# Patient Record
Sex: Male | Born: 1935
Health system: Southern US, Community
[De-identification: ages and names within clinical notes are randomized; demographics above are authoritative.]

## PROBLEM LIST (undated history)

## (undated) DIAGNOSIS — K810 Acute cholecystitis: Secondary | ICD-10-CM

## (undated) DIAGNOSIS — M545 Low back pain, unspecified: Secondary | ICD-10-CM

## (undated) DIAGNOSIS — L719 Rosacea, unspecified: Secondary | ICD-10-CM

## (undated) DIAGNOSIS — I10 Essential (primary) hypertension: Secondary | ICD-10-CM

## (undated) DIAGNOSIS — K219 Gastro-esophageal reflux disease without esophagitis: Secondary | ICD-10-CM

## (undated) DIAGNOSIS — R7301 Impaired fasting glucose: Secondary | ICD-10-CM

## (undated) DIAGNOSIS — R609 Edema, unspecified: Secondary | ICD-10-CM

## (undated) DIAGNOSIS — G629 Polyneuropathy, unspecified: Secondary | ICD-10-CM

## (undated) DIAGNOSIS — M1990 Unspecified osteoarthritis, unspecified site: Secondary | ICD-10-CM

## (undated) DIAGNOSIS — Z7901 Long term (current) use of anticoagulants: Secondary | ICD-10-CM

## (undated) DIAGNOSIS — D649 Anemia, unspecified: Secondary | ICD-10-CM

## (undated) DIAGNOSIS — G709 Myoneural disorder, unspecified: Secondary | ICD-10-CM

## (undated) DIAGNOSIS — E538 Deficiency of other specified B group vitamins: Secondary | ICD-10-CM

## (undated) DIAGNOSIS — G4733 Obstructive sleep apnea (adult) (pediatric): Secondary | ICD-10-CM

## (undated) DIAGNOSIS — I5032 Chronic diastolic (congestive) heart failure: Secondary | ICD-10-CM

## (undated) DIAGNOSIS — N182 Chronic kidney disease, stage 2 (mild): Secondary | ICD-10-CM

## (undated) DIAGNOSIS — I4821 Permanent atrial fibrillation: Secondary | ICD-10-CM

## (undated) DIAGNOSIS — R7303 Prediabetes: Secondary | ICD-10-CM

## (undated) HISTORY — DX: Anemia, unspecified: D64.9

## (undated) HISTORY — DX: Obstructive sleep apnea (adult) (pediatric): G47.33

## (undated) HISTORY — DX: Myoneural disorder, unspecified: G70.9

## (undated) HISTORY — DX: Deficiency of other specified B group vitamins: E53.8

## (undated) HISTORY — DX: Long term (current) use of anticoagulants: Z79.01

## (undated) HISTORY — PX: HERNIA REPAIR: SHX51

## (undated) HISTORY — DX: Chronic kidney disease, stage 2 (mild): N18.2

## (undated) HISTORY — PX: APPENDECTOMY: SHX54

## (undated) HISTORY — DX: Permanent atrial fibrillation: I48.21

## (undated) HISTORY — PX: EYE SURGERY: SHX253

## (undated) HISTORY — DX: Prediabetes: R73.03

## (undated) HISTORY — DX: Essential (primary) hypertension: I10

## (undated) HISTORY — PX: CATARACT EXTRACTION: SUR2

## (undated) HISTORY — DX: Unspecified osteoarthritis, unspecified site: M19.90

## (undated) HISTORY — DX: Chronic diastolic (congestive) heart failure: I50.32

## (undated) HISTORY — PX: COLON SURGERY: SHX602

## (undated) HISTORY — PX: CARDIOVERSION: SHX1299

## (undated) HISTORY — DX: Rosacea, unspecified: L71.9

---

## 1959-10-03 HISTORY — PX: TUMOR REMOVAL: SHX12

## 1996-03-02 HISTORY — PX: HEMICOLECTOMY: SHX854

## 2000-05-29 ENCOUNTER — Encounter (INDEPENDENT_AMBULATORY_CARE_PROVIDER_SITE_OTHER): Payer: Self-pay | Admitting: Specialist

## 2000-05-29 ENCOUNTER — Ambulatory Visit (HOSPITAL_COMMUNITY): Admission: RE | Admit: 2000-05-29 | Discharge: 2000-05-29 | Payer: Self-pay | Admitting: Gastroenterology

## 2004-08-29 ENCOUNTER — Encounter (INDEPENDENT_AMBULATORY_CARE_PROVIDER_SITE_OTHER): Payer: Self-pay | Admitting: *Deleted

## 2004-08-29 ENCOUNTER — Ambulatory Visit (HOSPITAL_COMMUNITY): Admission: RE | Admit: 2004-08-29 | Discharge: 2004-08-29 | Payer: Self-pay | Admitting: Gastroenterology

## 2006-02-07 ENCOUNTER — Inpatient Hospital Stay (HOSPITAL_COMMUNITY): Admission: EM | Admit: 2006-02-07 | Discharge: 2006-02-09 | Payer: Self-pay | Admitting: Emergency Medicine

## 2006-02-07 ENCOUNTER — Encounter (INDEPENDENT_AMBULATORY_CARE_PROVIDER_SITE_OTHER): Payer: Self-pay | Admitting: Cardiology

## 2006-03-23 ENCOUNTER — Ambulatory Visit (HOSPITAL_COMMUNITY): Admission: RE | Admit: 2006-03-23 | Discharge: 2006-03-23 | Payer: Self-pay | Admitting: Cardiology

## 2006-05-28 ENCOUNTER — Encounter: Admission: RE | Admit: 2006-05-28 | Discharge: 2006-05-28 | Payer: Self-pay | Admitting: Family Medicine

## 2008-02-07 ENCOUNTER — Ambulatory Visit (HOSPITAL_COMMUNITY): Admission: RE | Admit: 2008-02-07 | Discharge: 2008-02-07 | Payer: Self-pay | Admitting: Cardiology

## 2008-03-09 ENCOUNTER — Inpatient Hospital Stay (HOSPITAL_COMMUNITY): Admission: AD | Admit: 2008-03-09 | Discharge: 2008-03-12 | Payer: Self-pay | Admitting: Cardiology

## 2009-06-16 ENCOUNTER — Emergency Department (HOSPITAL_COMMUNITY): Admission: EM | Admit: 2009-06-16 | Discharge: 2009-06-16 | Payer: Self-pay | Admitting: Emergency Medicine

## 2009-08-08 ENCOUNTER — Emergency Department (HOSPITAL_COMMUNITY): Admission: EM | Admit: 2009-08-08 | Discharge: 2009-08-08 | Payer: Self-pay | Admitting: Emergency Medicine

## 2010-03-10 ENCOUNTER — Ambulatory Visit (HOSPITAL_COMMUNITY): Admission: RE | Admit: 2010-03-10 | Discharge: 2010-03-10 | Payer: Self-pay | Admitting: Cardiology

## 2010-12-13 ENCOUNTER — Ambulatory Visit (HOSPITAL_COMMUNITY): Payer: Medicare Other

## 2010-12-13 ENCOUNTER — Ambulatory Visit (HOSPITAL_COMMUNITY)
Admission: RE | Admit: 2010-12-13 | Discharge: 2010-12-13 | Disposition: A | Discharge status: Home / Self Care | Payer: Medicare Other | Source: Ambulatory Visit | Attending: Cardiology | Admitting: Cardiology

## 2010-12-13 DIAGNOSIS — I4891 Unspecified atrial fibrillation: Secondary | ICD-10-CM | POA: Insufficient documentation

## 2010-12-13 DIAGNOSIS — Z01818 Encounter for other preprocedural examination: Secondary | ICD-10-CM | POA: Insufficient documentation

## 2010-12-13 DIAGNOSIS — Z01810 Encounter for preprocedural cardiovascular examination: Secondary | ICD-10-CM | POA: Insufficient documentation

## 2010-12-13 LAB — APTT: aPTT: 44 seconds — ABNORMAL HIGH (ref 24–37)

## 2010-12-15 NOTE — Op Note (Signed)
  NAME:  Cameron [name], Cameron [name] NO.:  [id number]  MEDICAL RECORD NO.:  [id number]           PATIENT TYPE:  O  LOCATION:  SDSC                         FACILITY:  [facility]  PHYSICIAN:  Armanda Magic, M.D.     DATE OF BIRTH:  1934-03-25  DATE OF PROCEDURE:  12/13/2010 DATE OF DISCHARGE:  12/13/2010                              OPERATIVE REPORT   REFERRING PHYSICIAN:  Bryan Lemma. Manus Gunning, MD  PROCEDURE:  Direct current cardioversion.  OPERATOR:  Armanda Magic, MD  INDICATIONS:  Atrial flutter.  IV MEDICATIONS:  Propofol 90 mg IV.  COMPLICATIONS:  None.  This is 77 year old male with a history of atrial fibrillation on chronic systemic anticoagulation with therapeutic INR above 2 for more than 4 weeks who recently went back in atrial fibrillation and sotalol was increased to 160 mg b.i.d.  He now presents for cardioversion.  The patient is brought to the day hospital in a fasting nonsedated state.  Informed consent was obtained.  The patient was connected to continuous heart rate and pulse oximetry monitoring and intermittent blood pressure monitoring.  Defibrillator pads were placed on the left anterior chest and posterior back.  After adequate anesthesia was obtained, a 150 joules synchronized biphasic shock was delivered which successfully converted the patient to sinus bradycardia.  The patient tolerated procedure well without complications and later was discharged to home.  ASSESSMENT: 1. Atrial fibrillation with controlled ventricular response, status     post increase sotalol to 160 mg b.i.d. 2. Successful cardioversion to sinus bradycardia. 3. Systemic anticoagulation with therapeutic INR.  PLAN:  Discharge to home after fully awake and ambulatory.  Will follow up with me in 2 weeks.     Armanda Magic, M.D.     TT/MEDQ  D:  12/13/2010  T:  12/14/2010  Job:  [id number]  cc:   Bryan Lemma. Manus Gunning, M.D.  Electronically Signed by Armanda Magic M.D. on  12/15/2010 03:37:42 PM

## 2010-12-19 LAB — PROTIME-INR
Prothrombin Time: 22 seconds — ABNORMAL HIGH (ref 11.6–15.2)
Prothrombin Time: 23.1 seconds — ABNORMAL HIGH (ref 11.6–15.2)

## 2011-01-04 LAB — PROTIME-INR
INR: 1.34 (ref 0.00–1.49)
Prothrombin Time: 16.5 seconds — ABNORMAL HIGH (ref 11.6–15.2)

## 2011-02-14 NOTE — Op Note (Signed)
NAME:  Cameron [name], Cameron [name] NO.:  [id number]   MEDICAL RECORD NO.:  [id number]           PATIENT TYPE:   LOCATION:                                 FACILITY:   PHYSICIAN:  Armanda Magic, M.D.     DATE OF BIRTH:  16-Mar-1934   DATE OF PROCEDURE:  03/12/2008  DATE OF DISCHARGE:                               OPERATIVE REPORT   PROCEDURE:  Direct current cardioversion.   OPERATOR:  Armanda Magic, M.D.   INDICATIONS:  Atrial fibrillation.   COMPLICATIONS:  None.   IV MEDICATIONS:  Pentothal 175 mg IV.   This is a 77 year old male who is status post recent cardioversion and  then reverted back to AFib.  He is now status post sotalol load and  presents for cardioversion.  After the patient was in the fasting  nonsedated state, informed consent was obtained.  Anesthesia was  administered using 175 mg of Pentothal IV.  Defibrillation pads were  placed on the left anterior and posterior chest and after adequate  anesthesia was obtained, a 120 joules synchronized biphasic shock was  delivered, which successfully converted the patient to sinus rhythm.  The patient tolerated the procedure well without any complaints or  complications.   ASSESSMENT:  1. Atrial fibrillation status post cardioversion and sinus rhythm      after sotalol load.  2. Systemic anticoagulation with therapeutic INR.  3. Hypertension, stable.   PLAN:  Discharged to home after fully awake and ambulating.  We will  discharge him on Coumadin on current home dose sotalol 80 mg b.i.d.  Lotrel of 10/10 mg daily.  He will stop his Toprol and his Cardizem.  He  will remain on fish oil tablets, multivitamins, folic acid, Mobic, and  B12 injections monthly and he will follow up with my nurse practitioner  Catalina Gravel in 2 weeks.      Armanda Magic, M.D.  Electronically Signed     TT/MEDQ  D:  03/12/2008  T:  03/13/2008  Job:  [id number]

## 2011-02-14 NOTE — Op Note (Signed)
NAME:  Cameron [name], BRUUN NO.:  [id number]   MEDICAL RECORD NO.:  [id number]          PATIENT TYPE:  OIB   LOCATION:  2899                         FACILITY:  [facility]   PHYSICIAN:  Armanda Magic, M.D.     DATE OF BIRTH:  03-11-1934   DATE OF PROCEDURE:  DATE OF DISCHARGE:  02/07/2008                               OPERATIVE REPORT   REFERRING PHYSICIAN:  Bryan Lemma. Manus Gunning, MD   PROCEDURE:  Direct current cardioversion.   SURGEON:  Armanda Magic, MD   INDICATIONS:  Atrial fibrillation.   COMPLICATIONS:  None.   INTRAVENOUS MEDICATIONS:  Pentothal 225 mg IV.   HISTORY:  This is a very pleasant 77 year old male with a history of  atrial fibrillation in the past and hypertension, currently on systemic  anticoagulation with a therapeutic INR, he presents for cardioversion.   The [facility] was [facility] in a fasting nonsedated  state.  Informed consent was obtained.  The patient was connected to  continuous heart rate and pulse oximetry monitoring and intermittent  blood pressure monitoring.  After adequate anesthesia was obtained, a  115-J synchronized biphasic shock was delivered, which successfully  converted the patient to normal sinus rhythm.  The patient tolerated the  procedure well and was later discharged to home.   ASSESSMENT:  1. Atrial fibrillation with controlled ventricular response.  2. Systemic anticoagulation with therapeutic international normalized      ratio.  3. Normal left ventricular function.  4. Successful cardioversion and sinus rhythm.   PLAN:  Discharged home after fully awake.  Follow up with nurse  practitioner in 2 weeks in my office.      Armanda Magic, M.D.  Electronically Signed     TT/MEDQ  D:  02/07/2008  T:  02/08/2008  Job:  [id number]   cc:   Bryan Lemma. Manus Gunning, M.D.

## 2011-02-14 NOTE — Discharge Summary (Signed)
NAME:  Cameron [name], GIENGER NO.:  [id number]   MEDICAL RECORD NO.:  [id number]          PATIENT TYPE:  INP   LOCATION:  2019                         FACILITY:  [facility]   PHYSICIAN:  Armanda Magic, M.D.     DATE OF BIRTH:  Mar 20, 1934   DATE OF ADMISSION:  03/09/2008  DATE OF DISCHARGE:  03/12/2008                               DISCHARGE SUMMARY   DISCHARGE DIAGNOSES:  1. Atrial fibrillation status post cardioversion.  2. Bradycardia, resolved after holding Cardizem.  3. Hypertension, stable.  4. Systemic anticoagulation on Coumadin.  5. Long-term medication use.   HISTORY OF PRESENT ILLNESS:  Mr. Cameron [name] is a 77 year old male patient of  Dr. Mayford Knife who has a history of paroxysmal atrial fibrillation and had  multiple cardioversions in the past.  He visited to the office on Feb 21, 2008, and was found to again in atrial fibrillation with a  controlled ventricular response.  He is asymptomatic with this.  At that  point, a decision was made to admit the patient for a sotalol load.   The patient was admitted on March 09, 2008, for sotalol loading.  The  patient was loaded on sotalol and his QTc was around 449 milliseconds.  He did have some bradycardia and this resolved after holding his  Cardizem.  He remained on Coumadin for systemic anticoagulation.  Because, he remained in atrial fibrillation, he did not convert  chemically we did perform a direct current cardioversion on March 12, 2008.  This was successful in restoring normal sinus rhythm.   DISCHARGE LABS:  Hemoglobin 14.7, hematocrit 43.6, white count 7.0, and  platelets 139,000.  Sodium 137, potassium 4.3, BUN 12, creatinine 0.95.  PT 24.7 and INR 2.2.  TSH 1.086.   He is to remain on a low-sodium, heart-healthy diet.  Increase  activities slowly.  Return to see Dr. Rocky Crafts, nurse  practitioner on March 19, 2008, at 1:00 p.m.  An EKG will be done at that  visit.   He is to stop taking his Toprol,  Cardizem, and lisinopril.  Instead he  is to take these medications;  1. Coumadin.  Continue home dose.  2. Sotalol 80 mg twice a day.  3. Lotrel 10/10, 1 tablet daily.  4. Folic acid 1 mg daily.  5. Multivitamin 1 tablet daily.  6. Fish oil 1 g daily.  7. B12 injections monthly.   The patient is discharged home in stable but improved condition.      Guy Franco, P.A.      Armanda Magic, M.D.  Electronically Signed    LB/MEDQ  D:  03/12/2008  T:  03/13/2008  Job:  [id number]

## 2011-02-17 NOTE — H&P (Signed)
NAME:  Cameron [name], Cameron [name] NO.:  [id number]   MEDICAL RECORD NO.:  [id number]          PATIENT TYPE:  INP   LOCATION:  1831                         FACILITY:  [facility]   PHYSICIAN:  Armanda Magic, M.D.     DATE OF BIRTH:  Jul 17, 1934   DATE OF ADMISSION:  02/07/2006  DATE OF DISCHARGE:                                HISTORY & PHYSICAL   PRIMARY CARE PHYSICIAN:  Bryan Lemma. Manus Gunning, M.D.   CARDIOLOGIST:  Armanda Magic, M.D.   CHIEF COMPLAINTS:  New onset atrial fibrillation.   HISTORY OF PRESENT ILLNESS:  Cameron [name] is a 77 year old Caucasian male with  no previous cardiac history who was discovered to have an irregular heart  rhythm on routine physical exam by his primary care physician.  An EKG was  obtained by his primary care physician and revealed a new onset atrial  fibrillation with RVR at 112 beats per minute.  He was referred to Dr.  Mayford Knife at [facility] for further evaluation.  On yesterday [name]  presented to the [facility] office and during his office visit  received treatment for his elevated heart rate with IV Cardizem and IV  Lopressor and experienced a heart rate that decreased into the 90s.  He was  sent home with a new prescription for oral Coumadin and oral Toprol XL 25 mg  daily.  He was asked to get the Toprol XL 25 mg daily filled immediately and  take one dosage prior to going home.  [name] returned to the [facility]  Cardiology office this morning for a repeat EKG which revealed atrial  fibrillation with RVR at 148 beats per minute.  He was sent immediately to  the [facility]  Emergency department by private vehicle which was driven by  his son for rate control. An EKG was obtained upon arrival and revealed  atrial fibrillation with RVR at 147 beats per minute.  The patient admits to  taking one dose of the Toprol XL 25 mg last night and then again this  morning.  While in the emergency department, his blood pressure was 147/105  and his  heart rate was 146.  An IV Cardizem 10 mg bolus was started with 5  mg per hour drip.  In addition a stat dose of Toprol XL 25 mg was given.  The patient denied awareness of any symptoms associated with his atrial  fibrillation.  In addition, he denied chest pain, palpitations, dizziness,  syncope, PND, orthopnea; however, he admits to occasional lower extremity  edema as well as fatigue.  He denies an increase in intensity of his fatigue  over the last few months or weeks.   PAST MEDICAL HISTORY:  1.  Hypertension.  2.  In 1997 he had a benign colon tumor removed, pathology of which was two      tubulovillous adenomas with focal severe dysplasia in the right colon.      His appendix was removed as well.  Mesenteric lymph nodes were benign.  3.  History of gout.   ALLERGIES:  No known drug allergies.  CURRENT LIST OF MEDICATIONS:  1.  Coumadin 5 mg daily.  2.  Toprol XL 25 mg daily.  3.  Lisinopril 10 mg daily.  4.  Folic acid 1 mg daily.  5.  Mobic p.r.n.  6.  Loratadine 10 mg daily.  7.  Enteric-coated aspirin 325 mg daily.  8.  Multivitamin daily.  9.  B-12 shots.   PAST SURGICAL HISTORY:  1.  Status post resection of colon polyps in 1997.  2.  Status post appendectomy in 1997.   FAMILY HISTORY:  1.  Father deceased age 69, lung cancer.  2.  Mother deceased age 36, kidney cancer, hypertension.  3.  Sister deceased at age 11, lung cancer.  4.  Brother deceased 69, alcoholism.  5.  Sister age 39 in good health with hypertension.  6.  Twin brothers ages 88, in good health.   SOCIAL HISTORY:  He is married with two adult children who are both in good  health.  He lives with his wife who is wheelchair bound secondary to  multiple sclerosis.  He is the primary caretaker for his wife.  He used to  smoke a pack of cigarettes per day for 20 years however smoking cessation  occurred in 1976.  He admits to drinking 1-3 cans of beer occasionally.  He  denies significant caffeine  intake and denies illicit drug use.   REVIEW OF SYSTEMS:  All other systems reviewed are negative other than what  is stated in the HPI.   PHYSICAL EXAM:  GENERAL: A 77 year old well-developed, well-nourished male,  pleasant and cooperative, NAD.  VITAL SIGNS:  Temperature 98, pulse 150 and irregular, respirations 20,  blood pressure 155/100, O2 saturation 98% on room air.  HEENT: Unremarkable.  NECK: Supple without JVD or carotid bruits bilaterally.  Carotid upstrokes  are 2+ bilaterally.  LUNGS: Breath sounds are clear to auscultation bilaterally without wheezes,  rhonchi, crackles or rales.  HEART:  Irregular rate and rhythm.  S1-S2 normal without murmurs, gallops,  clicks or rubs.  ABDOMEN: Soft, nontender, nondistended with active bowel sounds.  No aortic  or iliac bruits were auscultated.  No masses or hepatomegaly.  EXTREMITIES: No peripheral edema.  DP and PT pulses 2+/2, bilaterally.  SKIN: Warm and dry without rashes or lesions.  NEURO: Alert and oriented x3.  No focal deficits.  PSYCH:  Normal mood and affect.   LABORATORY DATA:  Sodium 137, potassium 4.1, chloride 106, CO2 26.3, BUN 17,  creatinine 1.1, glucose 110.  White blood count 6.9, hemoglobin 16.9,  hematocrit 49.2, platelets 149.  Point of care markers x1, myoglobin 96.2,  CK-MB 1.6, troponin I less than 0.05. PT 14.7, INR 1.1, PTT 31, TSH and a  STAT D-dimer is pending.   Chest x-ray; mild cardiac enlargement.  No active disease.   EKG; atrial fibrillation with RVR at a ventricular rate of 147 beats per  minute.   ASSESSMENT:  1.  Rapid atrial fibrillation/atrial flutter.  2.  Systemic anticoagulation - started on Coumadin on 02/06/2006.  3.  Hypertension with elevated diastolic blood pressure.   PLAN:  1.  Admit to cardiac telemetry unit.  2.  Rule out MI with serial cardiac enzymes.  3.  Subcu Lovenox every 12 hours per pharmacy protocol. 4.  Coumadin per pharmacy dosing.  5.  A daily EKG and INR.   6.  IV Cardizem drops.  7.  Increase Toprol XL to 50 mg daily.  8.  NPO after midnight tonight on  02/07/2006.  9.  Adenosine Cardiolite on 05/10 if heart rate is controlled.  10. The patient was seen, interviewed, and examined by Dr. Armanda Magic.  He      is in agreement with the H&P as outlined.  Mr. Fieldhouse came to the [facility] emergency department today after being seen in the office for      repeat EKG that showed atrial fibrillation with RVR.  His physical exam      is benign with a rapid irregular rhythm.  EKG shows rapid atrial flutter      with 144 beats per minute and intermittent atrial fibrillation.  The      patient denies complaint of chest pain, palpitations, or shortness of      breath.      Tylene Fantasia, [place]      Armanda Magic, M.D.  Electronically Signed    RDM/MEDQ  D:  02/07/2006  T:  02/07/2006  Job:  [id number]   cc:   Bryan Lemma. Manus Gunning, M.D.  Fax: [phone]

## 2011-02-17 NOTE — Procedures (Signed)
[facility]  Patient:    Cameron [name], Cameron [name]                        MRN: [id number] Proc. Date: 05/29/00 Adm. Date:  [id number] Attending:  Nelda Marseille CC:         Blair Heys, M.D.   Procedure Report  PROCEDURE:  Colonoscopy with polypectomy.  INDICATIONS:  Patient with large adenomatous polyp requiring surgical options, due for repeat screening.  INFORMED CONSENT:  Consent was signed after risk, benefits, methods and options were thoroughly discussed multiple times in the past.  MEDICINES USED:  Demerol 50 mg, Versed 5 mg.  DESCRIPTION OF PROCEDURE:  Rectal inspection was pertinent for external hemorrhoids.  Digital exam was negative.  The video colonoscope was inserted and easily advanced to the anastomosis which was identified by the side-to-side anastomosis.  We were able to advance a short ways into both small bowel limbs.  Just above the anastomosis, a tiny 2-3 mm polyp was seen, was hot biopsied x 2.  The scope was then further withdrawn.  The prep was adequate.  There was minimal liquid stool that required washing and suctioning.  On slow withdrawal through his colon, no abnormalities were seen as we withdrew back to the rectum.  Once back in the rectum, the scope was retroflexed, pertinent for some internal hemorrhoids, small.  The scope was straightened.  Anorectal pull-through confirmed the above.  The scope was reinserted a short ways up the left side of the colon.  Air was suctioned and scope removed.  The patient tolerated the procedure well, and there was no obvious immediate complication.  ENDOSCOPIC DIAGNOSES: 1. Internal and external hemorrhoids. 2. Side-to-side colon into small bowel anastomosis along the ascending colon. 3. Polyp 2-3 mm in the proximal level of the anastomosis, status post hot    biopsied x 2. 4. Otherwise within normal limits to the terminal ileum.  PLAN: 1. Await pathology but probably recheck colon  screening in 3-5 years. 2. Return care to Dr. Manus Gunning for customary yearly rectals and guaiacs. 3. I will be happy to see back p.r.n.  In the meantime, one week of no aspirin    or arthritis pills. DD:  05/29/00 TD:  05/29/00 Job: [id number]

## 2011-02-17 NOTE — Op Note (Signed)
NAME:  ZYIR, GASSERT NO.:  [id number]   MEDICAL RECORD NO.:  [id number]          PATIENT TYPE:  OIB   LOCATION:  2899                         FACILITY:  [facility]   PHYSICIAN:  Armanda Magic, M.D.     DATE OF BIRTH:  1934/09/19   DATE OF PROCEDURE:  03/23/2006  DATE OF DISCHARGE:                                 OPERATIVE REPORT   REFERRING PHYSICIAN:  Bryan Lemma. Manus Gunning, M.D.   PROCEDURE:  Direct current cardioversion.   SURGEON:  Armanda Magic, M.D.   INDICATIONS:  Atrial fibrillation.   COMPLICATIONS:  None.   IV MEDICATIONS:  Diprivan 180 mg IV.   This is a very pleasant 77 year old white male who recently presented with  new onset atrial fibrillation with rapid ventricular response. A 2-D  echocardiogram showed mild LV dysfunction EF 45-50%.  Cardiolite scan showed  no evidence of inducible ischemia.  He now presents for direct current  cardioversion after adequate anticoagulation on Coumadin with an INR of  greater than or equal to 2 for greater than 4 weeks.   The [facility] the [facility] in the fasting nonsedated state.  Informed consent was obtained.  The patient was connected to continuous  heart rate and pulse oximetry monitoring and intermittent blood pressure  monitoring.  Defibrillator  pads were placed on the anterior and posterior  left chest wall after adequate anesthesia was given which included Diprivan  180 mg IV and after adequate anesthesia was obtained.  A synchronized 100  joule biphasic shock was delivered which successfully converted the patient  to normal sinus rhythm with frequent PACs.  The patient tolerated the  procedure well without complications.   ASSESSMENT:  1.  Atrial fibrillation.  2.  Cystic anticoagulation with therapeutic INR greater than 2 for four      weeks.  3.  Successful cardioversion to normal sinus rhythm with PACs.   PLAN:  Continue current medications, discharge to home after fully awake and  follow up with me in two to three weeks.      Armanda Magic, M.D.  Electronically Signed     TT/MEDQ  D:  03/23/2006  T:  03/23/2006  Job:  [id number]   cc:   Bryan Lemma. Manus Gunning, M.D.  Fax: [phone]

## 2011-02-17 NOTE — Discharge Summary (Signed)
NAME:  Cameron [name], Cameron [name]               ACCOUNT NO.:  [id number]   MEDICAL RECORD NO.:  [id number]          PATIENT TYPE:  INP   LOCATION:  4705                         FACILITY:  [facility]   PHYSICIAN:  Armanda Magic, M.D.     DATE OF BIRTH:  1934-09-30   DATE OF ADMISSION:  02/07/2006  DATE OF DISCHARGE:  02/09/2006                                 DISCHARGE SUMMARY   DISCHARGE DIAGNOSES:  1.  New-onset atrial fibrillation/flutter, rate-controlled.  2.  Systemic anticoagulation with discharge INR of 1.9.  3.  Low normal left ventricular ejection fraction.  4.  Hypertension, controlled.  5.  History of gout.  6.  Longterm medication use.  7.  Longterm Coumadin use.  8.  History of benign colon tumor removal in 1997 with incidental appendix      removal.   HOSPITAL COURSE:  [name] is a 77 year old male patient who was found to  be in a new-onset atrial fibrillation by his primary care physician.  He is  referred to [facility] for further evaluation.  In the office Feb 06, 2006, the patient's heart rate had been increased and he received IV  Cardizem as well as IV Lopressor.  His heart rate then decreased into the  90s and he was sent home with a Coumadin load and Toprol XL.  He returned to  [facility] on the date of admission and he remained in atrial  fibrillation with RVR.  He was then admitted to [facility], where he was  started on IV Cardizem, beta blocker was increased, and he was started on  Lovenox as well as Coumadin load.   During his hospital stay the patient underwent an adenosine Cardiolite that  showed no underlying ischemia.  The study showed no ischemia, EF around 51%.  Please note, the patient was in atrial fibrillation during that time.   The patient had lab work done during this study which included TSH of 1.301.  Hemoglobin 16.2, hematocrit 46.7, platelets 169.  Cardiac isoenzymes, which  were negative.  Portable chest x-ray showed mild cardiomegaly,  no active  disease.   The patient was discharged home on Feb 09, 2006, with good rate control.  He  was discharged home on the following medications:  1.  Coumadin 5 mg one tablet every Monday, Wednesday, Friday and Sunday with      half a tablet on Tuesday, Thursday and Saturday.  2.  Cardizem CD 180 mg a day.  3.  Lovenox 90 mg subcu.  He is to take one dose on Feb 09, 2006, at 10      p.m., one dose on Feb 10, 2006, at 10 a.m., another dose on May 12,      20 07, at 10 p.m., and then stop the Lovenox.  4.  Toprol XL 50 mg a day.  5.  Lisinopril 10 mg a day.  6.  Mobic as needed.  7.  Folic acid and multivitamin daily.  8.  Loratadine 10 mg a day.  9.  Baby aspirin 81 mg a day.   He is to  remain on a low-fat diet with knowledge of Coumadin restrictions.  He is to call if he has any recurrent symptoms, follow up at Dr. Norris Cross  office for lab work and EKG on Monday, May 14, at 3:15 p.m., then follow up  with Dr. Mayford Knife on Mar 01, 2006, at 10:45 a.m.      Guy Franco, P.A.      Armanda Magic, M.D.  Electronically Signed    LB/MEDQ  D:  02/09/2006  T:  02/10/2006  Job:  [id number]   cc:   Bryan Lemma. Manus Gunning, M.D.  Fax: [phone]

## 2011-02-17 NOTE — Op Note (Signed)
NAME:  Cameron [name], Cameron [name] NO.:  [id number]   MEDICAL RECORD NO.:  [id number]          PATIENT TYPE:  AMB   LOCATION:  ENDO                         FACILITY:  [facility]   PHYSICIAN:  Petra Kuba, M.D.    DATE OF BIRTH:  [birth year]   DATE OF PROCEDURE:  08/29/2004  DATE OF DISCHARGE:                                 OPERATIVE REPORT   PROCEDURE:  Colonoscopy with biopsy.   ENDOSCOPIST:  Petra Kuba, M.D.   INDICATIONS FOR PROCEDURE:  A patient with a history of a large adenomatous  polyp requiring surgical options, now due for a repeat screening.  A consent  was signed after the risks, benefits, methods and options were thoroughly  discussed in the office and multiple times in the past.   MEDICINES USED:  Demerol 75 mg, Versed 7.5 mg.   DESCRIPTION OF PROCEDURE:  A rectal inspection is pertinent for external  hemorrhoids, small.  A digital examination is negative.  The pediatric video  adjustable colonoscope was inserted and easily advanced around the colon to  the anastomosis.  This did require some abdominal pressure, but no position  changes.  The anastomosis was a colon side to small bowel side, and both of  the anastomoses were seen.  Along the surgical line were two tiny possible  little polyps, probably similar to what was seen before, and were re-cold  biopsied and put in the same container.  No other abnormality was seen on  insertion.  The scope was slowly withdrawn.  The prep was adequate.  There  was minimal liquid stool there which required washing and suctioning.  With  some withdrawal back in the rectum, no abnormalities were seen.  Specifically, no polyps, tumors, masses, or diverticula..  Once back in the  rectum, an anal-rectal pull-through on retroflexion confirmed some small  hemorrhoids.  The scope was drained and readvanced a short way up the left  side of the colon.  The air was suctioned.  The scope was removed.  The patient tolerated the  procedure well.  There were no obvious immediate  complications.   ENDOSCOPIC ASSESSMENT:  1.  Internal and external small hemorrhoids.  2.  Anastomosis with two tiny doubtful polyps that is status post biopsied.  3.  Otherwise within normal limits to the terminal ileum and colon      anastomosis.   PLAN:  Will await pathology.  Will probably recheck a colon screening in  five years.  I will be happy to see back p.r.n.  Otherwise return care to  Dr. Bryan Lemma. Ehinger for the customary health care maintenance, including  yearly rectals and guaiacs.       MEM/MEDQ  D:  08/29/2004  T:  08/29/2004  Job:  [id number]   cc:   Bryan Lemma. Manus Gunning, M.D.  [address] [place]  [place] [address]  Fax: [phone]

## 2011-06-29 LAB — MAGNESIUM: Magnesium: 2.3

## 2011-06-29 LAB — CBC
MCV: 91.4
RBC: 4.77
RDW: 14.9
WBC: 7

## 2011-06-29 LAB — BASIC METABOLIC PANEL
Calcium: 9.2
Creatinine, Ser: 0.95
GFR calc non Af Amer: >60
Glucose, Bld: 86
Potassium: 4.3

## 2011-06-29 LAB — PROTIME-INR
INR: 2.2 — ABNORMAL HIGH
INR: 2.4 — ABNORMAL HIGH
Prothrombin Time: 24.7 — ABNORMAL HIGH
Prothrombin Time: 28 — ABNORMAL HIGH

## 2011-08-10 ENCOUNTER — Institutional Professional Consult (permissible substitution): Payer: Federal, State, Local not specified - PPO | Admitting: Internal Medicine

## 2011-08-22 ENCOUNTER — Encounter: Payer: Self-pay | Admitting: *Deleted

## 2011-08-23 ENCOUNTER — Ambulatory Visit (INDEPENDENT_AMBULATORY_CARE_PROVIDER_SITE_OTHER): Payer: Medicare Other | Admitting: Internal Medicine

## 2011-08-23 ENCOUNTER — Encounter: Payer: Self-pay | Admitting: Internal Medicine

## 2011-08-23 VITALS — BP 152/80 | HR 79 | Ht 70.0 in | Wt 200.0 lb

## 2011-08-23 DIAGNOSIS — I4891 Unspecified atrial fibrillation: Secondary | ICD-10-CM

## 2011-08-23 DIAGNOSIS — I4821 Permanent atrial fibrillation: Secondary | ICD-10-CM | POA: Insufficient documentation

## 2011-08-23 NOTE — Progress Notes (Signed)
Primary Care Physician: Deborah Chalk, MD Referring Physician:Dr Turner   Cameron [name] is a 77 y.o. male with a h/o persistent atrial fibrillation, atrial flutter, and HTN who presents today for EP consultation.  He reports that in 2007, he presented for routine physical and was found to have afib.  He required cardioversion at that time.  He was placed on cardizem, toprol, and coumadin.  He developed recurrent 2009 and was therefore placed on sotalol.  He reports asymptomatic bradycardia with this.  He developed recurrent afib and was therefore placed on sotalol 160mg  BID twice daily.  He has most recently been cardioverted 3/12.  He does not recall feeling any better after cardioversion.  He reports being asymptomatic with his atrial arrhythmias.  He finds that his heart rates are 50s in sinus and 80s in afib.  He reports good exercise tolerance.  He mows his grass with no trouble.  His family is concerned that he appears dypsneic at times, though he denies this. Today, he denies symptoms of palpitations, chest pain, shortness of breath, orthopnea, PND,  dizziness, presyncope, syncope, or neurologic sequela. He has chronic edema for which he controls this with support hose.  The patient is tolerating medications without difficulties and is otherwise without complaint today.   He does have a h/o scarlet fever.  Past Medical History  Diagnosis Date  . Hypertension   . Atrial fibrillation     persistent  . Bradycardia     asymptomatic  . Rosacea   . Gout   . Vitamin B12 deficiency   . Atrial flutter    Past Surgical History  Procedure Date  . Hemicolectomy 03/1996  . Cardioversion 2009  . Cardioversion 2011    Current Outpatient Prescriptions  Medication Sig Dispense Refill  . bimatoprost (LUMIGAN) 0.01 % SOLN Apply 1 drop to eye at bedtime.        . cyanocobalamin (,VITAMIN B-12,) 1000 MCG/ML injection Inject 1,000 mcg into the muscle every 30 (thirty) days.        Marland Kitchen doxazosin  (CARDURA XL) 4 MG 24 hr tablet Take 4 mg by mouth daily with breakfast.        . fish oil-omega-3 fatty acids 1000 MG capsule Take 1 g by mouth daily.        . folic acid (FOLVITE) 1 MG tablet Take 1 mg by mouth daily.        . furosemide (LASIX) 40 MG tablet Take 40 mg by mouth daily.        Marland Kitchen gabapentin (NEURONTIN) 300 MG capsule Take 300 mg by mouth. 2-3 times daily       . meloxicam (MOBIC) 15 MG tablet Take 7.5 mg by mouth as needed.        . multivitamin (THERAGRAN) per tablet Take 1 tablet by mouth daily.        . Rivaroxaban (XARELTO) 20 MG TABS Take 1 tablet by mouth daily.        . sotalol (BETAPACE) 160 MG tablet Take 320 mg by mouth 2 (two) times daily.        . traMADol (ULTRAM) 50 MG tablet Take 50 mg by mouth every 4 (four) hours as needed. Maximum dose= 8 tablets per day         Allergies  Allergen Reactions  . Ace Inhibitors   . Clonidine Derivatives     History   Social History  . Marital Status: Married    Spouse Name: N/A  Number of Children: N/A  . Years of Education: N/A   Occupational History  . Not on file.   Social History Main Topics  . Smoking status: Former Games developer  . Smokeless tobacco: Never Used   Comment: quit 1976  . Alcohol Use: Yes     1-2 beers per day  . Drug Use: No  . Sexually Active: Not on file   Other Topics Concern  . Not on file   Social History Narrative   Pt lives in [place] with spouse.Retired in 1999 from the [employer].  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    Family History  Problem Relation Age of Onset  . Stroke Mother 60  . Dementia Sister     ROS- All systems are reviewed and negative except as per the HPI above  Physical Exam: Filed Vitals:   08/23/11 1004  BP: 152/80  Pulse: 79  Height: 5\' 10"  (1.778 m)  Weight: 200 lb (90.719 kg)    GEN- The patient is well appearing, alert and oriented x 3 today.   Head- normocephalic, atraumatic Eyes-   Sclera clear, conjunctiva pink Ears- hearing intact Oropharynx- clear Neck- supple, no JVP Lymph- no cervical lymphadenopathy Lungs- Clear to ausculation bilaterally, normal work of breathing Heart- Regular rate and rhythm, no murmurs, rubs or gallops, PMI not laterally displaced GI- soft, NT, ND, + BS Extremities- no clubbing, cyanosis, or edema MS- no significant deformity or atrophy Skin- no rash or lesion Psych- euthymic mood, full affect Neuro- strength and sensation are intact  EKG today reveals atypical atrial flutter, V rate 81 bpm Echo 2/12- EF 55%, LVEDD 49, LA size 54mm, mild MR  Assessment and Plan:

## 2011-08-23 NOTE — Patient Instructions (Signed)
Your physician recommends that you schedule a follow-up appointment in: as needed  

## 2011-08-23 NOTE — Assessment & Plan Note (Signed)
The patient has a h/o multiple atrial arrhythmias including persistent atrial fibrillation, typical atrial flutter, and atypical atrial flutter.  He has a h/o scarlet fever and has a left atrial size of 54mm by echo 2/12.  He has failed rhythm control with sotalol. Presently he is completely asymptomatic with his atypical atrial flutter.  HE does not feel that he received any symptomatic relief with prior cardioversion.  He is adequately rate controlled and anticoagulated with xarelto.  Therapeutic strategies for atrial arrhythmias including medicine and ablation were discussed in detail with the patient today. Risk, benefits, and alternatives to EP study and radiofrequency ablation were also discussed in detail today. As he is asymptomatic, I would recommend rate control long term.  Given his severe atrial enlargement and multiple atrial arrhythmias, I think that our ability to maintain sinus rhythm would be on the order of 50% and would likely require multiple procedures.   The patient and I discussed results of the AFFIRM trial which showed rate control to be equivalent to rhythm control for patients with asymptomatic atrial arrhythmias.  At this time, he would like to continue rate control as his long term strategy. I would consider stopping sotalol and switching to toprol to avoid the potential proarrhythmic effects of sotalol, but will defer this decision to Dr Mayford Knife.  He will follow-up with Dr Mayford Knife and I will see him as needed.

## 2011-10-06 DIAGNOSIS — E538 Deficiency of other specified B group vitamins: Secondary | ICD-10-CM | POA: Diagnosis not present

## 2011-10-12 DIAGNOSIS — H33309 Unspecified retinal break, unspecified eye: Secondary | ICD-10-CM | POA: Diagnosis not present

## 2011-10-16 DIAGNOSIS — H4010X0 Unspecified open-angle glaucoma, stage unspecified: Secondary | ICD-10-CM | POA: Diagnosis not present

## 2011-10-16 DIAGNOSIS — H02409 Unspecified ptosis of unspecified eyelid: Secondary | ICD-10-CM | POA: Diagnosis not present

## 2011-10-16 DIAGNOSIS — H02839 Dermatochalasis of unspecified eye, unspecified eyelid: Secondary | ICD-10-CM | POA: Diagnosis not present

## 2011-10-30 DIAGNOSIS — I119 Hypertensive heart disease without heart failure: Secondary | ICD-10-CM | POA: Diagnosis not present

## 2011-10-30 DIAGNOSIS — I4891 Unspecified atrial fibrillation: Secondary | ICD-10-CM | POA: Diagnosis not present

## 2011-10-30 DIAGNOSIS — R079 Chest pain, unspecified: Secondary | ICD-10-CM | POA: Diagnosis not present

## 2011-11-06 DIAGNOSIS — G609 Hereditary and idiopathic neuropathy, unspecified: Secondary | ICD-10-CM | POA: Diagnosis not present

## 2011-12-04 DIAGNOSIS — E538 Deficiency of other specified B group vitamins: Secondary | ICD-10-CM | POA: Diagnosis not present

## 2011-12-04 DIAGNOSIS — D510 Vitamin B12 deficiency anemia due to intrinsic factor deficiency: Secondary | ICD-10-CM | POA: Diagnosis not present

## 2012-01-01 DIAGNOSIS — D510 Vitamin B12 deficiency anemia due to intrinsic factor deficiency: Secondary | ICD-10-CM | POA: Diagnosis not present

## 2012-01-24 DIAGNOSIS — Z79899 Other long term (current) drug therapy: Secondary | ICD-10-CM | POA: Diagnosis not present

## 2012-01-24 DIAGNOSIS — I4891 Unspecified atrial fibrillation: Secondary | ICD-10-CM | POA: Diagnosis not present

## 2012-01-24 DIAGNOSIS — R609 Edema, unspecified: Secondary | ICD-10-CM | POA: Diagnosis not present

## 2012-01-24 DIAGNOSIS — I119 Hypertensive heart disease without heart failure: Secondary | ICD-10-CM | POA: Diagnosis not present

## 2012-02-01 DIAGNOSIS — I119 Hypertensive heart disease without heart failure: Secondary | ICD-10-CM | POA: Diagnosis not present

## 2012-02-01 DIAGNOSIS — D510 Vitamin B12 deficiency anemia due to intrinsic factor deficiency: Secondary | ICD-10-CM | POA: Diagnosis not present

## 2012-02-01 DIAGNOSIS — I4891 Unspecified atrial fibrillation: Secondary | ICD-10-CM | POA: Diagnosis not present

## 2012-02-23 DIAGNOSIS — H409 Unspecified glaucoma: Secondary | ICD-10-CM | POA: Diagnosis not present

## 2012-02-23 DIAGNOSIS — H4011X0 Primary open-angle glaucoma, stage unspecified: Secondary | ICD-10-CM | POA: Diagnosis not present

## 2012-04-03 DIAGNOSIS — E538 Deficiency of other specified B group vitamins: Secondary | ICD-10-CM | POA: Diagnosis not present

## 2012-04-08 DIAGNOSIS — H43819 Vitreous degeneration, unspecified eye: Secondary | ICD-10-CM | POA: Diagnosis not present

## 2012-04-08 DIAGNOSIS — Z961 Presence of intraocular lens: Secondary | ICD-10-CM | POA: Diagnosis not present

## 2012-04-08 DIAGNOSIS — H4011X0 Primary open-angle glaucoma, stage unspecified: Secondary | ICD-10-CM | POA: Diagnosis not present

## 2012-04-08 DIAGNOSIS — H4310 Vitreous hemorrhage, unspecified eye: Secondary | ICD-10-CM | POA: Diagnosis not present

## 2012-04-08 DIAGNOSIS — H409 Unspecified glaucoma: Secondary | ICD-10-CM | POA: Diagnosis not present

## 2012-04-08 DIAGNOSIS — H33319 Horseshoe tear of retina without detachment, unspecified eye: Secondary | ICD-10-CM | POA: Diagnosis not present

## 2012-04-08 DIAGNOSIS — H33309 Unspecified retinal break, unspecified eye: Secondary | ICD-10-CM | POA: Diagnosis not present

## 2012-04-11 DIAGNOSIS — H33319 Horseshoe tear of retina without detachment, unspecified eye: Secondary | ICD-10-CM | POA: Diagnosis not present

## 2012-04-18 DIAGNOSIS — H33319 Horseshoe tear of retina without detachment, unspecified eye: Secondary | ICD-10-CM | POA: Diagnosis not present

## 2012-05-02 DIAGNOSIS — H4310 Vitreous hemorrhage, unspecified eye: Secondary | ICD-10-CM | POA: Diagnosis not present

## 2012-05-02 DIAGNOSIS — E538 Deficiency of other specified B group vitamins: Secondary | ICD-10-CM | POA: Diagnosis not present

## 2012-05-02 DIAGNOSIS — H43819 Vitreous degeneration, unspecified eye: Secondary | ICD-10-CM | POA: Diagnosis not present

## 2012-05-02 DIAGNOSIS — Z961 Presence of intraocular lens: Secondary | ICD-10-CM | POA: Diagnosis not present

## 2012-05-02 DIAGNOSIS — H33319 Horseshoe tear of retina without detachment, unspecified eye: Secondary | ICD-10-CM | POA: Diagnosis not present

## 2012-05-30 DIAGNOSIS — D510 Vitamin B12 deficiency anemia due to intrinsic factor deficiency: Secondary | ICD-10-CM | POA: Diagnosis not present

## 2012-05-30 DIAGNOSIS — H4310 Vitreous hemorrhage, unspecified eye: Secondary | ICD-10-CM | POA: Diagnosis not present

## 2012-06-24 DIAGNOSIS — H4011X0 Primary open-angle glaucoma, stage unspecified: Secondary | ICD-10-CM | POA: Diagnosis not present

## 2012-06-24 DIAGNOSIS — H409 Unspecified glaucoma: Secondary | ICD-10-CM | POA: Diagnosis not present

## 2012-06-27 DIAGNOSIS — D510 Vitamin B12 deficiency anemia due to intrinsic factor deficiency: Secondary | ICD-10-CM | POA: Diagnosis not present

## 2012-07-05 DIAGNOSIS — Z86010 Personal history of colonic polyps: Secondary | ICD-10-CM | POA: Diagnosis not present

## 2012-07-05 DIAGNOSIS — E781 Pure hyperglyceridemia: Secondary | ICD-10-CM | POA: Diagnosis not present

## 2012-07-05 DIAGNOSIS — I10 Essential (primary) hypertension: Secondary | ICD-10-CM | POA: Diagnosis not present

## 2012-07-05 DIAGNOSIS — D649 Anemia, unspecified: Secondary | ICD-10-CM | POA: Diagnosis not present

## 2012-07-05 DIAGNOSIS — I4891 Unspecified atrial fibrillation: Secondary | ICD-10-CM | POA: Diagnosis not present

## 2012-07-05 DIAGNOSIS — Z0000 Encounter for general adult medical examination without abnormal findings: Secondary | ICD-10-CM | POA: Diagnosis not present

## 2012-07-05 DIAGNOSIS — Z79899 Other long term (current) drug therapy: Secondary | ICD-10-CM | POA: Diagnosis not present

## 2012-07-05 DIAGNOSIS — E538 Deficiency of other specified B group vitamins: Secondary | ICD-10-CM | POA: Diagnosis not present

## 2012-07-05 DIAGNOSIS — G609 Hereditary and idiopathic neuropathy, unspecified: Secondary | ICD-10-CM | POA: Diagnosis not present

## 2012-07-25 DIAGNOSIS — I10 Essential (primary) hypertension: Secondary | ICD-10-CM | POA: Diagnosis not present

## 2012-07-25 DIAGNOSIS — G609 Hereditary and idiopathic neuropathy, unspecified: Secondary | ICD-10-CM | POA: Diagnosis not present

## 2012-07-25 DIAGNOSIS — R609 Edema, unspecified: Secondary | ICD-10-CM | POA: Diagnosis not present

## 2012-07-25 DIAGNOSIS — I4891 Unspecified atrial fibrillation: Secondary | ICD-10-CM | POA: Diagnosis not present

## 2012-08-26 DIAGNOSIS — Z961 Presence of intraocular lens: Secondary | ICD-10-CM | POA: Diagnosis not present

## 2012-08-26 DIAGNOSIS — H4310 Vitreous hemorrhage, unspecified eye: Secondary | ICD-10-CM | POA: Diagnosis not present

## 2012-08-26 DIAGNOSIS — H43819 Vitreous degeneration, unspecified eye: Secondary | ICD-10-CM | POA: Diagnosis not present

## 2012-08-26 DIAGNOSIS — H33319 Horseshoe tear of retina without detachment, unspecified eye: Secondary | ICD-10-CM | POA: Diagnosis not present

## 2012-08-26 DIAGNOSIS — D510 Vitamin B12 deficiency anemia due to intrinsic factor deficiency: Secondary | ICD-10-CM | POA: Diagnosis not present

## 2012-09-26 DIAGNOSIS — E538 Deficiency of other specified B group vitamins: Secondary | ICD-10-CM | POA: Diagnosis not present

## 2012-10-29 DIAGNOSIS — H4011X0 Primary open-angle glaucoma, stage unspecified: Secondary | ICD-10-CM | POA: Diagnosis not present

## 2012-10-29 DIAGNOSIS — H409 Unspecified glaucoma: Secondary | ICD-10-CM | POA: Diagnosis not present

## 2012-10-29 DIAGNOSIS — H52209 Unspecified astigmatism, unspecified eye: Secondary | ICD-10-CM | POA: Diagnosis not present

## 2012-10-29 DIAGNOSIS — H01009 Unspecified blepharitis unspecified eye, unspecified eyelid: Secondary | ICD-10-CM | POA: Diagnosis not present

## 2012-10-29 DIAGNOSIS — D510 Vitamin B12 deficiency anemia due to intrinsic factor deficiency: Secondary | ICD-10-CM | POA: Diagnosis not present

## 2012-11-26 DIAGNOSIS — D510 Vitamin B12 deficiency anemia due to intrinsic factor deficiency: Secondary | ICD-10-CM | POA: Diagnosis not present

## 2012-12-24 DIAGNOSIS — D510 Vitamin B12 deficiency anemia due to intrinsic factor deficiency: Secondary | ICD-10-CM | POA: Diagnosis not present

## 2013-01-22 DIAGNOSIS — D510 Vitamin B12 deficiency anemia due to intrinsic factor deficiency: Secondary | ICD-10-CM | POA: Diagnosis not present

## 2013-02-03 DIAGNOSIS — H4011X0 Primary open-angle glaucoma, stage unspecified: Secondary | ICD-10-CM | POA: Diagnosis not present

## 2013-02-03 DIAGNOSIS — H43819 Vitreous degeneration, unspecified eye: Secondary | ICD-10-CM | POA: Diagnosis not present

## 2013-02-03 DIAGNOSIS — H4310 Vitreous hemorrhage, unspecified eye: Secondary | ICD-10-CM | POA: Diagnosis not present

## 2013-02-03 DIAGNOSIS — Z961 Presence of intraocular lens: Secondary | ICD-10-CM | POA: Diagnosis not present

## 2013-02-20 DIAGNOSIS — I4891 Unspecified atrial fibrillation: Secondary | ICD-10-CM | POA: Diagnosis not present

## 2013-02-20 DIAGNOSIS — R609 Edema, unspecified: Secondary | ICD-10-CM | POA: Diagnosis not present

## 2013-02-20 DIAGNOSIS — D510 Vitamin B12 deficiency anemia due to intrinsic factor deficiency: Secondary | ICD-10-CM | POA: Diagnosis not present

## 2013-02-20 DIAGNOSIS — I10 Essential (primary) hypertension: Secondary | ICD-10-CM | POA: Diagnosis not present

## 2013-03-25 DIAGNOSIS — D510 Vitamin B12 deficiency anemia due to intrinsic factor deficiency: Secondary | ICD-10-CM | POA: Diagnosis not present

## 2013-04-22 DIAGNOSIS — D510 Vitamin B12 deficiency anemia due to intrinsic factor deficiency: Secondary | ICD-10-CM | POA: Diagnosis not present

## 2013-04-29 DIAGNOSIS — Z961 Presence of intraocular lens: Secondary | ICD-10-CM | POA: Diagnosis not present

## 2013-04-29 DIAGNOSIS — H4011X0 Primary open-angle glaucoma, stage unspecified: Secondary | ICD-10-CM | POA: Diagnosis not present

## 2013-04-29 DIAGNOSIS — H409 Unspecified glaucoma: Secondary | ICD-10-CM | POA: Diagnosis not present

## 2013-05-20 DIAGNOSIS — E538 Deficiency of other specified B group vitamins: Secondary | ICD-10-CM | POA: Diagnosis not present

## 2013-06-17 DIAGNOSIS — D510 Vitamin B12 deficiency anemia due to intrinsic factor deficiency: Secondary | ICD-10-CM | POA: Diagnosis not present

## 2013-06-21 ENCOUNTER — Other Ambulatory Visit: Payer: Self-pay | Admitting: Cardiology

## 2013-06-21 DIAGNOSIS — Z79899 Other long term (current) drug therapy: Secondary | ICD-10-CM

## 2013-06-21 DIAGNOSIS — I4891 Unspecified atrial fibrillation: Secondary | ICD-10-CM

## 2013-07-28 DIAGNOSIS — Z23 Encounter for immunization: Secondary | ICD-10-CM | POA: Diagnosis not present

## 2013-07-28 DIAGNOSIS — D510 Vitamin B12 deficiency anemia due to intrinsic factor deficiency: Secondary | ICD-10-CM | POA: Diagnosis not present

## 2013-07-28 DIAGNOSIS — G609 Hereditary and idiopathic neuropathy, unspecified: Secondary | ICD-10-CM | POA: Diagnosis not present

## 2013-07-28 DIAGNOSIS — Z1331 Encounter for screening for depression: Secondary | ICD-10-CM | POA: Diagnosis not present

## 2013-07-28 DIAGNOSIS — E538 Deficiency of other specified B group vitamins: Secondary | ICD-10-CM | POA: Diagnosis not present

## 2013-07-28 DIAGNOSIS — E781 Pure hyperglyceridemia: Secondary | ICD-10-CM | POA: Diagnosis not present

## 2013-07-28 DIAGNOSIS — I4891 Unspecified atrial fibrillation: Secondary | ICD-10-CM | POA: Diagnosis not present

## 2013-07-28 DIAGNOSIS — Z0000 Encounter for general adult medical examination without abnormal findings: Secondary | ICD-10-CM | POA: Diagnosis not present

## 2013-07-28 DIAGNOSIS — I10 Essential (primary) hypertension: Secondary | ICD-10-CM | POA: Diagnosis not present

## 2013-08-08 ENCOUNTER — Ambulatory Visit (INDEPENDENT_AMBULATORY_CARE_PROVIDER_SITE_OTHER): Payer: Federal, State, Local not specified - PPO | Admitting: Surgery

## 2013-08-13 ENCOUNTER — Telehealth (INDEPENDENT_AMBULATORY_CARE_PROVIDER_SITE_OTHER): Payer: Self-pay

## 2013-08-13 ENCOUNTER — Ambulatory Visit (INDEPENDENT_AMBULATORY_CARE_PROVIDER_SITE_OTHER): Payer: Medicare Other | Admitting: Surgery

## 2013-08-13 ENCOUNTER — Telehealth: Payer: Self-pay | Admitting: Cardiology

## 2013-08-13 ENCOUNTER — Encounter (INDEPENDENT_AMBULATORY_CARE_PROVIDER_SITE_OTHER): Payer: Self-pay | Admitting: Surgery

## 2013-08-13 ENCOUNTER — Encounter (INDEPENDENT_AMBULATORY_CARE_PROVIDER_SITE_OTHER): Payer: Self-pay

## 2013-08-13 VITALS — BP 136/84 | HR 64 | Temp 98.5°F | Resp 14 | Ht 70.0 in | Wt 213.0 lb

## 2013-08-13 DIAGNOSIS — K4090 Unilateral inguinal hernia, without obstruction or gangrene, not specified as recurrent: Secondary | ICD-10-CM | POA: Diagnosis not present

## 2013-08-13 NOTE — Telephone Encounter (Signed)
agree

## 2013-08-13 NOTE — Patient Instructions (Signed)
[facility]  HERNIA REPAIR POST OP INSTRUCTIONS  Always review your discharge instruction sheet given to you by the facility where your surgery was performed.  1. A  prescription for pain medication may be given to you upon discharge.  Take your pain medication as prescribed.  If narcotic pain medicine is not needed, then you may take acetaminophen (Tylenol) or ibuprofen (Advil) as needed.  2. Take your usually prescribed medications unless otherwise directed.  3. If you need a refill on your pain medication, please contact your pharmacy.  They will contact our office to request authorization. Prescriptions will not be filled after 5 pm daily or on weekends.  4. You should follow a light diet the first 24 hours after arrival home, such as soup and crackers or toast.  Be sure to include plenty of fluids daily.  Resume your normal diet the day after surgery.  5. Most patients will experience some swelling and bruising around the surgical site.  Ice packs and reclining will help.  Swelling and bruising can take several days to resolve.   6. It is common to experience some constipation if taking pain medication after surgery.  Increasing fluid intake and taking a stool softener (such as Colace) will usually help or prevent this problem from occurring.  A mild laxative (Milk of Magnesia or Miralax) should be taken according to package directions if there are no bowel movements after 48 hours.  7. Unless discharge instructions indicate otherwise, you may remove your bandages 24-48 hours after surgery, and you may shower at that time.  You may have steri-strips (small skin tapes) in place directly over the incision.  These strips should be left on the skin for 7-10 days.  If your surgeon used skin glue on the incision, you may shower in 24 hours.  The glue will flake off over the next 2-3 weeks.  Any sutures or staples will be removed at the office during your follow-up  visit.  8. ACTIVITIES:  You may resume regular (light) daily activities beginning the next day-such as daily self-care, walking, climbing stairs-gradually increasing activities as tolerated.  You may have sexual intercourse when it is comfortable.  Refrain from any heavy lifting or straining until approved by your doctor.  You may drive when you are no longer taking prescription pain medication, you can comfortably wear a seatbelt, and you can safely maneuver your car and apply brakes.  9. You should see your doctor in the office for a follow-up appointment approximately 2-3 weeks after your surgery.  Make sure that you call for this appointment within a day or two after you arrive home to insure a convenient appointment time. 10.   WHEN TO CALL YOUR DOCTOR: 1. Fever greater than 101.0 2. Inability to urinate 3. Persistent nausea and/or vomiting 4. Extreme swelling or bruising 5. Continued bleeding from incision 6. Increased pain, redness, or drainage from the incision  The clinic staff is available to answer your questions during regular business hours.  Please don't hesitate to call and ask to speak to one of the nurses for clinical concerns.  If you have a medical emergency, go to the nearest emergency room or call 911.  A surgeon from [facility] is always on call for the hospital.   [facility] [address], [address], [place], [place]  [address]  [phone] ? [phone] ? FAX [phone]  [email or web address]   

## 2013-08-13 NOTE — Telephone Encounter (Signed)
New Problem:  Pt states he is scheduled for the 20th to have surgery at [facility] for his hernia... Pt states he was told to stop his xeralto. Pt was wanting to get Dr. Norris Cross ok. Please advise

## 2013-08-13 NOTE — Telephone Encounter (Signed)
To Jeremy to advise 

## 2013-08-13 NOTE — Telephone Encounter (Signed)
Pt calling stating Dr. Norris Cross office is only requiring him to stop his Xarelto 2two days prior to his surgery on 08/21/13. He may resume taking it on 08-30-13.  Message will be passed on to Dr. Gerrit Friends.

## 2013-08-13 NOTE — Telephone Encounter (Signed)
Patient notified to not take Xarelto two days prior to hernia surgery on 08/21/13.  He will stop Xarelto 11/18, then restart 08/22/13.  Patient notified and shows verbal understanding of plan.  To Dr. Mayford Knife as well as Lorain Childes.

## 2013-08-13 NOTE — Progress Notes (Signed)
General Surgery [facility]  Chief Complaint  Patient presents with  . New Evaluation    eval [facility] - referral from Dr. Blair Heys    HISTORY: Patient is a 79 year old male known to my surgical practice from a previous colonic resection in 1997. Over the past 3 years the patient has developed a bulge in the left groin. This has become mildly symptomatic. He thinks this was caused by the need to help lift and assist his wife who has multiple sclerosis. He has had no prior hernia repairs. He has had no signs or symptoms of obstruction. He now presents for evaluation.  Past Medical History  Diagnosis Date  . Hypertension   . Atrial fibrillation     persistent  . Bradycardia     asymptomatic  . Rosacea   . Gout   . Vitamin B12 deficiency   . Atrial flutter   . Anemia   . Arthritis   . Neuromuscular disorder     Current Outpatient Prescriptions  Medication Sig Dispense Refill  . bimatoprost (LUMIGAN) 0.01 % SOLN Apply 1 drop to eye at bedtime.        . cyanocobalamin (,VITAMIN B-12,) 1000 MCG/ML injection Inject 1,000 mcg into the muscle every 30 (thirty) days.        Marland Kitchen doxazosin (CARDURA XL) 4 MG 24 hr tablet Take 4 mg by mouth daily with breakfast.        . fish oil-omega-3 fatty acids 1000 MG capsule Take 1 g by mouth daily.        . furosemide (LASIX) 40 MG tablet Take 40 mg by mouth daily.        Marland Kitchen gabapentin (NEURONTIN) 300 MG capsule Take 300 mg by mouth. 2-3 times daily       . meloxicam (MOBIC) 15 MG tablet Take 7.5 mg by mouth as needed.        . multivitamin (THERAGRAN) per tablet Take 1 tablet by mouth daily.        . Rivaroxaban (XARELTO) 20 MG TABS Take 1 tablet by mouth daily.        . sotalol (BETAPACE) 160 MG tablet Take 320 mg by mouth 2 (two) times daily.        . traMADol (ULTRAM) 50 MG tablet Take 50 mg by mouth every 4 (four) hours as needed. Maximum dose= 8 tablets per day       . folic acid (FOLVITE) 1 MG tablet Take 1 mg by mouth  daily.         No current facility-administered medications for this visit.    Allergies  Allergen Reactions  . Ace Inhibitors   . Clonidine Derivatives     Family History  Problem Relation Age of Onset  . Stroke Mother 31  . Dementia Sister   . Cancer Father     lung    History   Social History  . Marital Status: Married    Spouse Name: N/A    Number of Children: N/A  . Years of Education: N/A   Social History Main Topics  . Smoking status: Former Games developer  . Smokeless tobacco: Never Used     Comment: quit 1976  . Alcohol Use: Yes     Comment: 1-2 beers per day  . Drug Use: No  . Sexual Activity: None   Other Topics Concern  . None   Social History Narrative   Pt lives in [place] with spouse.   Retired in  1999 from the [employer].  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          REVIEW OF SYSTEMS - PERTINENT POSITIVES ONLY: No signs or symptoms of obstruction. No prior hernia repairs. Previous midline incision for colonic resection. No pain.  EXAM: Filed Vitals:   08/13/13 0915  BP: 136/84  Pulse: 64  Temp: 98.5 F (36.9 C)  Resp: 14    HEENT: normocephalic; pupils equal and reactive; sclerae clear; dentition good; mucous membranes moist NECK:  symmetric on extension; no palpable anterior or posterior cervical lymphadenopathy; no supraclavicular masses; no tenderness CHEST: clear to auscultation bilaterally without rales, rhonchi, or wheezes CARDIAC: irregular rate and rhythm without significant murmur; peripheral pulses are full ABDOMEN: soft without distension; bowel sounds present; no mass; no hepatosplenomegaly; well-healed midline incision without evidence of incisional hernia GU:  Normal male genitalia without mass or lesion; large left inguinal hernia, indirect, extending into the left hemiscrotum; moderate right inguinal hernia, probably direct, reducible, nontender EXT:  non-tender without  edema; no deformity NEURO: no gross focal deficits; no sign of tremor   LABORATORY RESULTS: See [facility] (CHL-Epic) for most recent results  RADIOLOGY RESULTS: See [facility] (CHL-Epic) for most recent results  IMPRESSION: #1 left inguinal hernia, indirect, symptomatic #2 right inguinal hernia, direct, asymptomatic #3 atrial fibrillation on anticoagulation  PLAN: I had a lengthy discussion with the patient and his son who was in attendance today. Patient has bilateral inguinal hernias, both of which need to be repaired at some point. The left-sided hernia is much larger and is symptomatic. I would recommend that he undergo repair of the left inguinal hernia with mesh in the near future. We discussed the risk and benefits of the procedure. We discussed the risk of recurrence. We discussed alternative methods including laparoscopic repair. Given the fact that he has had a previous midline abdominal incision, and the fact that the left inguinal hernia is indirect and moderately large, I do not think he is a good candidate for laparoscopic repair. I also have recommended against concurrent bilateral inguinal hernia repair. I do believe he can have the surgery as an outpatient. However we will do this in the hospital operating room as a same day surgery. Patient and his son understand and agree to proceed.  The risks and benefits of the procedure have been discussed at length with the patient.  The patient understands the proposed procedure, potential alternative treatments, and the course of recovery to be expected.  All of the patient's questions have been answered at this time.  The patient wishes to proceed with surgery.  Velora Heckler, MD, FACS General & Endocrine Surgery [facility]  Primary Care Physician: Thora Lance, MD

## 2013-08-14 NOTE — Telephone Encounter (Signed)
LMOV.  Please make pt aware ok to stop Xarelto as instructed.

## 2013-08-14 NOTE — Telephone Encounter (Signed)
Pt returned call.  Message below relayed.  Pt verbalized understanding. 

## 2013-08-14 NOTE — Telephone Encounter (Signed)
This was reviewed with Dr Gerrit Friends and he states ok to proceed with this recommendation to d/c Xarelto 2 days prior to surgery. LMOM.

## 2013-08-15 ENCOUNTER — Encounter (HOSPITAL_COMMUNITY): Payer: Self-pay | Admitting: Pharmacy Technician

## 2013-08-15 ENCOUNTER — Other Ambulatory Visit (HOSPITAL_COMMUNITY): Payer: Self-pay | Admitting: Surgery

## 2013-08-15 NOTE — Progress Notes (Addendum)
lov dr turner cardiology 02-20-13 in chart 10-30-11 stress test dr Mayford Knife on chart 11-23-10 echo dr tuner on chart Labs from 07/28/13 on chart

## 2013-08-15 NOTE — Patient Instructions (Addendum)
20 Cameron [name]  08/15/2013   Your procedure is scheduled on: Thursday November 20th  Report to [facility] at 1115 AM.  Call this number if you have problems the morning of surgery [phone]   Remember:   Do not eat food  :After Midnight.  Clear liquids midnight until 745 am day of surgery, then nothing by mouth.   Take these medicines the morning of surgery with A SIP OF WATER: doxazosin, gabapentin, sotalol                                SEE [facility] PREPARING FOR SURGERY SHEET             You may not have any metal on your body including hair pins and piercings  Do not wear jewelry, make-up.  Do not wear lotions, powders, or perfumes. You may wear deodorant.   Men may shave face and neck.  Do not bring valuables to the hospital. [facility] IS NOT RESPONSIBLE FOR VALUEABLES.  Contacts, dentures or bridgework may not be worn into surgery.     Patients discharged the day of surgery will not be allowed to drive home.  Name and phone number of your driver: Cameron [name] [phone]   Please read over the following fact sheets that you were given: clear liquids fact sheet  Call Birdie Sons  RN pre op nurse if needed [phone]    FAILURE TO FOLLOW THESE INSTRUCTIONS MAY RESULT IN THE CANCELLATION OF YOUR SURGERY.  PATIENT SIGNATURE___________________________________________  NURSE SIGNATURE_____________________________________________

## 2013-08-18 ENCOUNTER — Encounter (HOSPITAL_COMMUNITY)
Admission: RE | Admit: 2013-08-18 | Discharge: 2013-08-18 | Disposition: A | Discharge status: Home / Self Care | Payer: Medicare Other | Source: Ambulatory Visit | Attending: Surgery | Admitting: Surgery

## 2013-08-18 ENCOUNTER — Encounter (HOSPITAL_COMMUNITY): Payer: Self-pay

## 2013-08-18 ENCOUNTER — Ambulatory Visit (HOSPITAL_COMMUNITY)
Admission: RE | Admit: 2013-08-18 | Discharge: 2013-08-18 | Disposition: A | Discharge status: Home / Self Care | Payer: Medicare Other | Source: Ambulatory Visit | Attending: Surgery | Admitting: Surgery

## 2013-08-18 DIAGNOSIS — K4090 Unilateral inguinal hernia, without obstruction or gangrene, not specified as recurrent: Secondary | ICD-10-CM | POA: Insufficient documentation

## 2013-08-18 DIAGNOSIS — Z01810 Encounter for preprocedural cardiovascular examination: Secondary | ICD-10-CM | POA: Diagnosis not present

## 2013-08-18 DIAGNOSIS — Z01812 Encounter for preprocedural laboratory examination: Secondary | ICD-10-CM | POA: Insufficient documentation

## 2013-08-18 DIAGNOSIS — Z01818 Encounter for other preprocedural examination: Secondary | ICD-10-CM | POA: Insufficient documentation

## 2013-08-18 DIAGNOSIS — J984 Other disorders of lung: Secondary | ICD-10-CM | POA: Diagnosis not present

## 2013-08-18 HISTORY — DX: Polyneuropathy, unspecified: G62.9

## 2013-08-18 LAB — CBC
HCT: 41.3 % (ref 39.0–52.0)
RBC: 4.86 MIL/uL (ref 4.22–5.81)
RDW: 14.5 % (ref 11.5–15.5)
WBC: 7 10*3/uL (ref 4.0–10.5)

## 2013-08-18 LAB — BASIC METABOLIC PANEL
Chloride: 101 mEq/L (ref 96–112)
Creatinine, Ser: 1.01 mg/dL (ref 0.50–1.35)
GFR calc Af Amer: 81 mL/min — ABNORMAL LOW (ref >90)
Potassium: 4.4 mEq/L (ref 3.5–5.1)
Sodium: 138 mEq/L (ref 135–145)

## 2013-08-18 NOTE — Progress Notes (Signed)
08/18/13 0910  OBSTRUCTIVE SLEEP APNEA  Have you ever been diagnosed with sleep apnea through a sleep study? No  Do you snore loudly (loud enough to be heard through closed doors)?  1  Do you often feel tired, fatigued, or sleepy during the daytime? 0  Has anyone observed you stop breathing during your sleep? 0  Do you have, or are you being treated for high blood pressure? 1  BMI more than 35 kg/m2? 0  Age over 77 years old? 1  Neck circumference greater than 40 cm/18 inches? 0  Gender: 1  Obstructive Sleep Apnea Score 4  Score 4 or greater  Results sent to PCP

## 2013-08-18 NOTE — Progress Notes (Signed)
Quick Note:  These results are acceptable for scheduled surgery.  Honestie Kulik M. Linah Klapper, MD, FACS [facility] Office: [phone]   ______ 

## 2013-08-18 NOTE — Progress Notes (Signed)
Dr. Leta Jungling notified of request of consult to anesthesia. Pt will be seen on day of surgery.

## 2013-08-21 ENCOUNTER — Encounter (HOSPITAL_COMMUNITY)
Admission: RE | Disposition: A | Discharge status: Home / Self Care | Payer: Self-pay | Source: Ambulatory Visit | Attending: Surgery

## 2013-08-21 ENCOUNTER — Ambulatory Visit (HOSPITAL_COMMUNITY): Payer: Medicare Other | Admitting: Anesthesiology

## 2013-08-21 ENCOUNTER — Ambulatory Visit (HOSPITAL_COMMUNITY)
Admission: RE | Admit: 2013-08-21 | Discharge: 2013-08-21 | Disposition: A | Discharge status: Home / Self Care | Payer: Medicare Other | Source: Ambulatory Visit | Attending: Surgery | Admitting: Surgery

## 2013-08-21 ENCOUNTER — Encounter (HOSPITAL_COMMUNITY): Payer: Medicare Other | Admitting: Anesthesiology

## 2013-08-21 ENCOUNTER — Encounter (HOSPITAL_COMMUNITY): Payer: Self-pay | Admitting: *Deleted

## 2013-08-21 DIAGNOSIS — I4891 Unspecified atrial fibrillation: Secondary | ICD-10-CM | POA: Diagnosis not present

## 2013-08-21 DIAGNOSIS — K4090 Unilateral inguinal hernia, without obstruction or gangrene, not specified as recurrent: Secondary | ICD-10-CM | POA: Diagnosis present

## 2013-08-21 DIAGNOSIS — K4030 Unilateral inguinal hernia, with obstruction, without gangrene, not specified as recurrent: Secondary | ICD-10-CM

## 2013-08-21 DIAGNOSIS — I4892 Unspecified atrial flutter: Secondary | ICD-10-CM | POA: Insufficient documentation

## 2013-08-21 DIAGNOSIS — Z7901 Long term (current) use of anticoagulants: Secondary | ICD-10-CM | POA: Insufficient documentation

## 2013-08-21 DIAGNOSIS — I10 Essential (primary) hypertension: Secondary | ICD-10-CM | POA: Diagnosis not present

## 2013-08-21 HISTORY — PX: INSERTION OF MESH: SHX5868

## 2013-08-21 HISTORY — PX: INGUINAL HERNIA REPAIR: SHX194

## 2013-08-21 SURGERY — REPAIR, HERNIA, INGUINAL, ADULT
Anesthesia: General | Laterality: Left | Wound class: Clean

## 2013-08-21 MED ORDER — ONDANSETRON HCL 4 MG/2ML IJ SOLN
INTRAMUSCULAR | Status: AC
  Filled 2013-08-21: qty 2

## 2013-08-21 MED ORDER — PROPOFOL 10 MG/ML IV BOLUS
INTRAVENOUS | Status: DC | PRN
  Administered 2013-08-21: 150 mg via INTRAVENOUS

## 2013-08-21 MED ORDER — FENTANYL CITRATE 0.05 MG/ML IJ SOLN
INTRAMUSCULAR | Status: DC | PRN
  Administered 2013-08-21 (×3): 50 ug via INTRAVENOUS
  Administered 2013-08-21: 100 ug via INTRAVENOUS

## 2013-08-21 MED ORDER — ROCURONIUM BROMIDE 100 MG/10ML IV SOLN
INTRAVENOUS | Status: AC
  Filled 2013-08-21: qty 1

## 2013-08-21 MED ORDER — CEFAZOLIN SODIUM-DEXTROSE 2-3 GM-% IV SOLR
INTRAVENOUS | Status: AC
  Filled 2013-08-21: qty 50

## 2013-08-21 MED ORDER — PROPOFOL 10 MG/ML IV BOLUS
INTRAVENOUS | Status: AC
  Filled 2013-08-21: qty 20

## 2013-08-21 MED ORDER — NEOSTIGMINE METHYLSULFATE 1 MG/ML IJ SOLN
INTRAMUSCULAR | Status: DC | PRN
  Administered 2013-08-21: 5 mg via INTRAVENOUS

## 2013-08-21 MED ORDER — OXYCODONE-ACETAMINOPHEN 5-325 MG PO TABS
1.0000 | ORAL_TABLET | ORAL | Status: DC | PRN
  Administered 2013-08-21: 1 via ORAL
  Filled 2013-08-21: qty 1

## 2013-08-21 MED ORDER — GLYCOPYRROLATE 0.2 MG/ML IJ SOLN
INTRAMUSCULAR | Status: AC
  Filled 2013-08-21: qty 1

## 2013-08-21 MED ORDER — LACTATED RINGERS IV SOLN: INTRAVENOUS | Status: DC

## 2013-08-21 MED ORDER — ATROPINE SULFATE 0.4 MG/ML IJ SOLN
INTRAMUSCULAR | Status: DC | PRN
  Administered 2013-08-21: 0.2 mg via INTRAVENOUS

## 2013-08-21 MED ORDER — HYDROMORPHONE HCL PF 1 MG/ML IJ SOLN
INTRAMUSCULAR | Status: AC
  Filled 2013-08-21: qty 1

## 2013-08-21 MED ORDER — GLYCOPYRROLATE 0.2 MG/ML IJ SOLN
INTRAMUSCULAR | Status: AC
  Filled 2013-08-21: qty 3

## 2013-08-21 MED ORDER — BUPIVACAINE HCL (PF) 0.5 % IJ SOLN
INTRAMUSCULAR | Status: DC | PRN
  Administered 2013-08-21: 25 mL

## 2013-08-21 MED ORDER — GLYCOPYRROLATE 0.2 MG/ML IJ SOLN
INTRAMUSCULAR | Status: DC | PRN
  Administered 2013-08-21: 0.6 mg via INTRAVENOUS
  Administered 2013-08-21: 0.2 mg via INTRAVENOUS

## 2013-08-21 MED ORDER — HYDROMORPHONE HCL PF 1 MG/ML IJ SOLN
0.2500 mg | INTRAMUSCULAR | Status: DC | PRN
  Administered 2013-08-21: 0.5 mg via INTRAVENOUS

## 2013-08-21 MED ORDER — LIDOCAINE HCL (CARDIAC) 20 MG/ML IV SOLN
INTRAVENOUS | Status: AC
  Filled 2013-08-21: qty 5

## 2013-08-21 MED ORDER — LACTATED RINGERS IV SOLN
INTRAVENOUS | Status: DC
  Administered 2013-08-21: 1000 mL via INTRAVENOUS

## 2013-08-21 MED ORDER — OXYCODONE-ACETAMINOPHEN 5-325 MG PO TABS
1.0000 | ORAL_TABLET | ORAL | Status: DC | PRN
Start: 2013-08-21 — End: 2013-08-27

## 2013-08-21 MED ORDER — BUPIVACAINE HCL (PF) 0.5 % IJ SOLN
INTRAMUSCULAR | Status: AC
  Filled 2013-08-21: qty 30

## 2013-08-21 MED ORDER — LIDOCAINE HCL (PF) 2 % IJ SOLN
INTRAMUSCULAR | Status: DC | PRN
  Administered 2013-08-21: 75 mg via INTRADERMAL

## 2013-08-21 MED ORDER — ONDANSETRON HCL 4 MG/2ML IJ SOLN
INTRAMUSCULAR | Status: DC | PRN
  Administered 2013-08-21: 4 mg via INTRAVENOUS

## 2013-08-21 MED ORDER — ROCURONIUM BROMIDE 100 MG/10ML IV SOLN
INTRAVENOUS | Status: DC | PRN
  Administered 2013-08-21: 50 mg via INTRAVENOUS

## 2013-08-21 MED ORDER — NEOSTIGMINE METHYLSULFATE 1 MG/ML IJ SOLN
INTRAMUSCULAR | Status: AC
  Filled 2013-08-21: qty 10

## 2013-08-21 MED ORDER — FENTANYL CITRATE 0.05 MG/ML IJ SOLN
INTRAMUSCULAR | Status: AC
  Filled 2013-08-21: qty 5

## 2013-08-21 MED ORDER — CEFAZOLIN SODIUM-DEXTROSE 2-3 GM-% IV SOLR
2.0000 g | INTRAVENOUS | Status: AC
  Administered 2013-08-21: 2 g via INTRAVENOUS

## 2013-08-21 SURGICAL SUPPLY — 35 items
APPLICATOR COTTON TIP 6IN STRL (MISCELLANEOUS) IMPLANT
BENZOIN TINCTURE PRP APPL 2/3 (GAUZE/BANDAGES/DRESSINGS) ×2 IMPLANT
BLADE HEX COATED 2.75 (ELECTRODE) ×2 IMPLANT
BLADE SURG 15 STRL LF DISP TIS (BLADE) IMPLANT
BLADE SURG 15 STRL SS (BLADE)
BLADE SURG SZ10 CARB STEEL (BLADE) IMPLANT
CANISTER SUCTION 2500CC (MISCELLANEOUS) ×2 IMPLANT
DECANTER SPIKE VIAL GLASS SM (MISCELLANEOUS) IMPLANT
DRAIN PENROSE 18X1/2 LTX STRL (DRAIN) ×2 IMPLANT
DRAPE LAPAROTOMY TRNSV 102X78 (DRAPE) ×2 IMPLANT
ELECT REM PT RETURN 9FT ADLT (ELECTROSURGICAL) ×2
ELECTRODE REM PT RTRN 9FT ADLT (ELECTROSURGICAL) ×1 IMPLANT
GLOVE BIOGEL PI IND STRL 7.0 (GLOVE) IMPLANT
GLOVE BIOGEL PI INDICATOR 7.0 (GLOVE)
GLOVE SURG ORTHO 8.0 STRL STRW (GLOVE) ×2 IMPLANT
GOWN PREVENTION PLUS LG XLONG (DISPOSABLE) IMPLANT
GOWN STRL REIN XL XLG (GOWN DISPOSABLE) ×6 IMPLANT
KIT BASIN OR (CUSTOM PROCEDURE TRAY) ×2 IMPLANT
MESH ULTRAPRO 3X6 7.6X15CM (Mesh General) ×2 IMPLANT
NEEDLE HYPO 25X1 1.5 SAFETY (NEEDLE) ×2 IMPLANT
NS IRRIG 1000ML POUR BTL (IV SOLUTION) ×2 IMPLANT
PACK BASIC VI WITH GOWN DISP (CUSTOM PROCEDURE TRAY) ×2 IMPLANT
PENCIL BUTTON HOLSTER BLD 10FT (ELECTRODE) ×2 IMPLANT
SPONGE GAUZE 4X4 12PLY (GAUZE/BANDAGES/DRESSINGS) ×2 IMPLANT
SPONGE LAP 4X18 X RAY DECT (DISPOSABLE) ×4 IMPLANT
STRIP CLOSURE SKIN 1/2X4 (GAUZE/BANDAGES/DRESSINGS) ×2 IMPLANT
SUT MNCRL AB 4-0 PS2 18 (SUTURE) ×2 IMPLANT
SUT NOVA NAB GS-22 2 0 T19 (SUTURE) ×4 IMPLANT
SUT SILK 2 0 SH (SUTURE) IMPLANT
SUT VIC AB 3-0 SH 18 (SUTURE) ×2 IMPLANT
SYR BULB IRRIGATION 50ML (SYRINGE) ×2 IMPLANT
SYR CONTROL 10ML LL (SYRINGE) ×2 IMPLANT
TAPE CLOTH SURG 4X10 WHT LF (GAUZE/BANDAGES/DRESSINGS) ×2 IMPLANT
TOWEL OR 17X26 10 PK STRL BLUE (TOWEL DISPOSABLE) ×2 IMPLANT
YANKAUER SUCT BULB TIP 10FT TU (MISCELLANEOUS) ×2 IMPLANT

## 2013-08-21 NOTE — H&P (View-Only) (Signed)
General Surgery - [facility]  Chief Complaint  Patient presents with  . New Evaluation    eval [facility] - referral from Dr. Robert Ehinger    HISTORY: Patient is a 79-year-old male known to my surgical practice from a previous colonic resection in 1997. Over the past 3 years the patient has developed a bulge in the left groin. This has become mildly symptomatic. He thinks this was caused by the need to help lift and assist his wife who has multiple sclerosis. He has had no prior hernia repairs. He has had no signs or symptoms of obstruction. He now presents for evaluation.  Past Medical History  Diagnosis Date  . Hypertension   . Atrial fibrillation     persistent  . Bradycardia     asymptomatic  . Rosacea   . Gout   . Vitamin B12 deficiency   . Atrial flutter   . Anemia   . Arthritis   . Neuromuscular disorder     Current Outpatient Prescriptions  Medication Sig Dispense Refill  . bimatoprost (LUMIGAN) 0.01 % SOLN Apply 1 drop to eye at bedtime.        . cyanocobalamin (,VITAMIN B-12,) 1000 MCG/ML injection Inject 1,000 mcg into the muscle every 30 (thirty) days.        . doxazosin (CARDURA XL) 4 MG 24 hr tablet Take 4 mg by mouth daily with breakfast.        . fish oil-omega-3 fatty acids 1000 MG capsule Take 1 g by mouth daily.        . furosemide (LASIX) 40 MG tablet Take 40 mg by mouth daily.        . gabapentin (NEURONTIN) 300 MG capsule Take 300 mg by mouth. 2-3 times daily       . meloxicam (MOBIC) 15 MG tablet Take 7.5 mg by mouth as needed.        . multivitamin (THERAGRAN) per tablet Take 1 tablet by mouth daily.        . Rivaroxaban (XARELTO) 20 MG TABS Take 1 tablet by mouth daily.        . sotalol (BETAPACE) 160 MG tablet Take 320 mg by mouth 2 (two) times daily.        . traMADol (ULTRAM) 50 MG tablet Take 50 mg by mouth every 4 (four) hours as needed. Maximum dose= 8 tablets per day       . folic acid (FOLVITE) 1 MG tablet Take 1 mg by mouth  daily.         No current facility-administered medications for this visit.    Allergies  Allergen Reactions  . Ace Inhibitors   . Clonidine Derivatives     Family History  Problem Relation Age of Onset  . Stroke Mother 80  . Dementia Sister   . Cancer Father     lung    History   Social History  . Marital Status: Married    Spouse Name: N/A    Number of Children: N/A  . Years of Education: N/A   Social History Main Topics  . Smoking status: Former Smoker  . Smokeless tobacco: Never Used     Comment: quit 1976  . Alcohol Use: Yes     Comment: 1-2 beers per day  . Drug Use: No  . Sexual Activity: None   Other Topics Concern  . None   Social History Narrative   Pt lives in [place] with spouse.   Retired in   1999 from the [employer].  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          REVIEW OF SYSTEMS - PERTINENT POSITIVES ONLY: No signs or symptoms of obstruction. No prior hernia repairs. Previous midline incision for colonic resection. No pain.  EXAM: Filed Vitals:   08/13/13 0915  BP: 136/84  Pulse: 64  Temp: 98.5 F (36.9 C)  Resp: 14    HEENT: normocephalic; pupils equal and reactive; sclerae clear; dentition good; mucous membranes moist NECK:  symmetric on extension; no palpable anterior or posterior cervical lymphadenopathy; no supraclavicular masses; no tenderness CHEST: clear to auscultation bilaterally without rales, rhonchi, or wheezes CARDIAC: irregular rate and rhythm without significant murmur; peripheral pulses are full ABDOMEN: soft without distension; bowel sounds present; no mass; no hepatosplenomegaly; well-healed midline incision without evidence of incisional hernia GU:  Normal male genitalia without mass or lesion; large left inguinal hernia, indirect, extending into the left hemiscrotum; moderate right inguinal hernia, probably direct, reducible, nontender EXT:  non-tender without  edema; no deformity NEURO: no gross focal deficits; no sign of tremor   LABORATORY RESULTS: See [facility] (CHL-Epic) for most recent results  RADIOLOGY RESULTS: See [facility] (CHL-Epic) for most recent results  IMPRESSION: #1 left inguinal hernia, indirect, symptomatic #2 right inguinal hernia, direct, asymptomatic #3 atrial fibrillation on anticoagulation  PLAN: I had a lengthy discussion with the patient and his son who was in attendance today. Patient has bilateral inguinal hernias, both of which need to be repaired at some point. The left-sided hernia is much larger and is symptomatic. I would recommend that he undergo repair of the left inguinal hernia with mesh in the near future. We discussed the risk and benefits of the procedure. We discussed the risk of recurrence. We discussed alternative methods including laparoscopic repair. Given the fact that he has had a previous midline abdominal incision, and the fact that the left inguinal hernia is indirect and moderately large, I do not think he is a good candidate for laparoscopic repair. I also have recommended against concurrent bilateral inguinal hernia repair. I do believe he can have the surgery as an outpatient. However we will do this in the hospital operating room as a same day surgery. Patient and his son understand and agree to proceed.  The risks and benefits of the procedure have been discussed at length with the patient.  The patient understands the proposed procedure, potential alternative treatments, and the course of recovery to be expected.  All of the patient's questions have been answered at this time.  The patient wishes to proceed with surgery.  Kemora Pinard M. Meleana Commerford, MD, FACS General & Endocrine Surgery [facility]  Primary Care Physician: EHINGER,ROBERT R, MD   

## 2013-08-21 NOTE — Anesthesia Preprocedure Evaluation (Addendum)
Anesthesia Evaluation  Patient identified by MRN, date of birth, ID band Patient awake    Reviewed: Allergy & Precautions, H&P , NPO status , Patient's Chart, lab work & pertinent test results, reviewed documented beta blocker date and time   Airway Mallampati: II TM Distance: >3 FB Neck ROM: full    Dental  (+) Edentulous Upper and Edentulous Lower   Pulmonary neg pulmonary ROS, former smoker,  breath sounds clear to auscultation  Pulmonary exam normal       Cardiovascular Exercise Tolerance: Good hypertension, Pt. on home beta blockers and Pt. on medications + dysrhythmias Atrial Fibrillation Rhythm:regular Rate:Normal  History asymptomatic bradycardia. ECG AF   Neuro/Psych Peripheral neuropahty negative neurological ROS  negative psych ROS   GI/Hepatic negative GI ROS, Neg liver ROS,   Endo/Other  negative endocrine ROS  Renal/GU negative Renal ROS  negative genitourinary   Musculoskeletal   Abdominal   Peds  Hematology negative hematology ROS (+) anemia ,   Anesthesia Other Findings   Reproductive/Obstetrics negative OB ROS                        Anesthesia Physical Anesthesia Plan  ASA: III  Anesthesia Plan: General   Post-op Pain Management:    Induction: Intravenous  Airway Management Planned: Oral ETT  Additional Equipment:   Intra-op Plan:   Post-operative Plan: Extubation in OR  Informed Consent: I have reviewed the patients History and Physical, chart, labs and discussed the procedure including the risks, benefits and alternatives for the proposed anesthesia with the patient or authorized representative who has indicated his/her understanding and acceptance.   Dental Advisory Given  Plan Discussed with: CRNA and Surgeon  Anesthesia Plan Comments:        Anesthesia Quick Evaluation

## 2013-08-21 NOTE — Op Note (Signed)
Inguinal Hernia, Open, Procedure Note  Pre-operative Diagnosis:  Left indirect inguinal hernia  Post-operative Diagnosis: same  Surgeon:  Velora Heckler, MD, FACS  Anesthesia:  General  Preparation:  Chlora-prep  Estimated Blood Loss: Minimal  Complications: None; patient tolerated the procedure well.  Indications: The patient presented with a left, not reducible hernia.    Procedure Details  The patient was evaluated in the holding area. All of the patient's questions were answered and the proposed procedure was confirmed. The site of the procedure was properly marked. The patient was taken to the Operating Room, identified by name, and the procedure verified as hernia repair.  The patient was placed in the supine position and underwent induction of anesthesia. A "Time Out" was performed per routine. The lower abdomen and groin were prepped and draped in the usual aseptic fashion.  After ascertaining that an adequate level of anesthesia had been obtained, an incision was made in the groin with a #10 blade.  Dissection was carried through the subcutaneous tissues and hemostasis obtained with the electrocautery.  A Gelpi retractor was placed for exposure.  The external oblique fascia was incised in line with it's fibers and extended through the external inguinal ring.  The cord structures were dissected out of the inguinal canal and encircled with a Penrose drain.  There is a very large indirect inguinal hernia containing sigmoid colon and adipose tissue.  The indirect sac is dissected out and reduced into the peritoneal cavity.  The internal inguinal ring is closed with interrupted 2-0 silk surtures.  The floor of the inguinal canal was dissected out.  There is also a small direct hernia located medially.  It is reduced and closed with interrupted 2-0 silk sutures.  The floor of the inguinal canal was reconstructed with Ethicon Ultrapro mesh cut to the appropriate dimensions.  It was secured  to the pubic tubercle with a 2-0 Novafil suture and along the inguinal ligament with a running 2-0 Novafil suture.  Mesh was split to accommodate the cord structures.  The superior margin of the mesh was secured to the transversalis and internal oblique musculature with interrupted 2-0 Novafil sutures.  The tails of the mesh were overlapped lateral to the cord structures and secured to the inguinal ligament with interrupted 2-0 Novafil sutures to recreate the internal inguinal ring.  Cord structures were returned to the inguinal canal.  Local anesthetic was infiltrated throughout the field.  External oblique fascia was closed with interrupted 3-0 Vicryl sutures.  Subcutaneous tissues were closed with interrupted 3-0 Vicryl sutures.  Skin was anesthetized with local anesthetic, and the skin edges were re-approximated with a running 4-0 Monocryl suture.  Wound was washed and dried and benzoin and steristrips were applied.  A gauze dressing was applied.  Instrument, sponge, and needle counts were correct prior to closure and at the conclusion of the case.  The patient tolerated the procedure well.  The patient was awakened from anesthesia and brought to the recovery room in stable condition.  Velora Heckler, MD, [facility] Office: [phone]

## 2013-08-21 NOTE — Transfer of Care (Signed)
Immediate Anesthesia Transfer of Care Note  Patient: Cameron [name]  Procedure(s) Performed: Procedure(s) (LRB):  LEFT INGUINAL HERNIA REPAIR WITH MESH (Left) INSERTION OF MESH (Left)  Patient Location: PACU  Anesthesia Type: General  Level of Consciousness: sedated, patient cooperative and responds to stimulation  Airway & Oxygen Therapy: Patient Spontanous Breathing and Patient connected to face mask oxgen  Post-op Assessment: Report given to PACU RN and Post -op Vital signs reviewed and stable  Post vital signs: Reviewed and stable  Complications: No apparent anesthesia complications

## 2013-08-21 NOTE — Interval H&P Note (Signed)
History and Physical Interval Note:  08/21/2013 1:55 PM  Cameron [name]  has presented today for surgery, with the diagnosis of left ingunial hernia.  The various methods of treatment have been discussed with the patient and family. After consideration of risks, benefits and other options for treatment, the patient has consented to    Procedure(s):  LEFT INGUINAL HERNIA REPAIR WITH MESH (Left) INSERTION OF MESH (Left) as a surgical intervention .    The patient's history has been reviewed, patient examined, no change in status, stable for surgery.  I have reviewed the patient's chart and labs.  Questions were answered to the patient's satisfaction.    Velora Heckler, MD, [facility] Office: [phone]    Lior Cartelli Judie Petit

## 2013-08-21 NOTE — Anesthesia Postprocedure Evaluation (Signed)
Anesthesia Post Note  Patient: Cameron [name]  Procedure(s) Performed: Procedure(s) (LRB):  LEFT INGUINAL HERNIA REPAIR WITH MESH (Left) INSERTION OF MESH (Left)  Anesthesia type: General  Patient location: PACU  Post pain: Pain level controlled  Post assessment: Post-op Vital signs reviewed  Last Vitals:  Filed Vitals:   08/21/13 1615  BP: 160/88  Pulse:   Temp: 36.5 C  Resp:     Post vital signs: Reviewed  Level of consciousness: sedated  Complications: No apparent anesthesia complications

## 2013-08-21 NOTE — Progress Notes (Signed)
Dr. Gerrit Friends  Given update on patient, ambulating in halls, drinking, and still attempting to void.

## 2013-08-21 NOTE — Progress Notes (Signed)
Pt. Ambulating in halls, drinking water and soda, multiple attempts to void, pt. Unable to void, bladder scan showing of urine, pt. Drinking more water and soda, pt. Stated" I'm starting to feel a little pressure".

## 2013-08-22 ENCOUNTER — Encounter (HOSPITAL_COMMUNITY): Payer: Self-pay | Admitting: Surgery

## 2013-08-27 ENCOUNTER — Other Ambulatory Visit: Payer: Self-pay | Admitting: General Surgery

## 2013-08-27 ENCOUNTER — Ambulatory Visit (INDEPENDENT_AMBULATORY_CARE_PROVIDER_SITE_OTHER): Payer: Medicare Other | Admitting: Cardiology

## 2013-08-27 ENCOUNTER — Encounter: Payer: Self-pay | Admitting: Cardiology

## 2013-08-27 VITALS — BP 151/74 | HR 79 | Ht 70.5 in | Wt 214.6 lb

## 2013-08-27 DIAGNOSIS — R600 Localized edema: Secondary | ICD-10-CM | POA: Insufficient documentation

## 2013-08-27 DIAGNOSIS — R001 Bradycardia, unspecified: Secondary | ICD-10-CM | POA: Insufficient documentation

## 2013-08-27 DIAGNOSIS — D510 Vitamin B12 deficiency anemia due to intrinsic factor deficiency: Secondary | ICD-10-CM | POA: Diagnosis not present

## 2013-08-27 DIAGNOSIS — R609 Edema, unspecified: Secondary | ICD-10-CM | POA: Diagnosis not present

## 2013-08-27 DIAGNOSIS — I10 Essential (primary) hypertension: Secondary | ICD-10-CM

## 2013-08-27 DIAGNOSIS — Z7901 Long term (current) use of anticoagulants: Secondary | ICD-10-CM

## 2013-08-27 DIAGNOSIS — I4891 Unspecified atrial fibrillation: Secondary | ICD-10-CM | POA: Diagnosis not present

## 2013-08-27 NOTE — Progress Notes (Signed)
[address] [place], [place]  [address] Phone: [phone] Fax:  [phone]  Date:  08/27/2013   ID:  Cameron [name], DOB 04-28-1934, MRN [id number]  PCP:  Thora Lance, MD  Cardiologist:  Armanda Magic, MD     History of Present Illness: Cameron [name] is a 79 y.o. male with a history of PAF, systemic anticoagulation and HTN presents today for followup.  He is doing well from a cardiac standpoint.  He is now recovering from inguinal hernia surgery. He denies any chest pain, SOB, DOE, LE edema, dizziness, palpitations or syncope.     Wt Readings from Last 3 Encounters:  08/27/13 214 lb 9.6 oz (97.342 kg)  08/21/13 212 lb (96.163 kg)  08/21/13 212 lb (96.163 kg)     Past Medical History  Diagnosis Date  . Bradycardia     asymptomatic  . Rosacea   . Gout     in 70's  . Vitamin B12 deficiency   . Anemia   . Arthritis   . Neuromuscular disorder   . Peripheral neuropathy     both feet, can feel pain  . Anxiety     at times  . Headache(784.0)     hx of, none recent  . Hypertension   . Atrial fibrillation     s/p DCCV  . Atrial flutter   . Bradycardia   . Chronic anticoagulation     Current Outpatient Prescriptions  Medication Sig Dispense Refill  . bimatoprost (LUMIGAN) 0.01 % SOLN Place 1 drop into both eyes at bedtime.       . cyanocobalamin (,VITAMIN B-12,) 1000 MCG/ML injection Inject 1,000 mcg into the muscle every 30 (thirty) days.       Marland Kitchen doxazosin (CARDURA) 4 MG tablet Take 4 mg by mouth daily. Take one tablet daily for BP      . fish oil-omega-3 fatty acids 1000 MG capsule Take 1 g by mouth 2 (two) times daily.       . folic acid (FOLVITE) 1 MG tablet Take 1 mg by mouth daily.       . furosemide (LASIX) 40 MG tablet Take 40 mg by mouth daily as needed for fluid.       Marland Kitchen gabapentin (NEURONTIN) 300 MG capsule Take 300 mg by mouth 2 (two) times daily. 2-3 times daily      . meloxicam (MOBIC) 15 MG tablet Take 7.5 mg by mouth as needed for  pain.       . Rivaroxaban (XARELTO) 20 MG TABS Take 1 tablet by mouth daily.       . sotalol (BETAPACE) 120 MG tablet Take 120 mg by mouth 2 (two) times daily.      . traMADol (ULTRAM) 50 MG tablet Take 50 mg by mouth every 4 (four) hours as needed for moderate pain or severe pain. Maximum dose= 8 tablets per day       No current facility-administered medications for this visit.    Allergies:    Allergies  Allergen Reactions  . Ace Inhibitors     Lips inflated to about "20 pound"  . Clonidine Derivatives Rash    Social History:  The patient  reports that he quit smoking about 38 years ago. His smoking use included Cigarettes. He smoked 2.00 packs per day. He has never used smokeless tobacco. He reports that he drinks alcohol. He reports that he does not use illicit drugs.   Family History:  The patient's family  history includes Cancer in his father; Dementia in his sister; Stroke (age of onset: 79) in his mother.   ROS:  Please see the history of present illness.      All other systems reviewed and negative.   PHYSICAL EXAM: VS:  BP 151/74  Pulse 79  Ht 5' 10.5" (1.791 m)  Wt 214 lb 9.6 oz (97.342 kg)  BMI 30.35 kg/m2 Well nourished, well developed, in no acute distress HEENT: normal Neck: no JVD Cardiac:  normal S1, S2; RRR; no murmur Lungs:  clear to auscultation bilaterally, no wheezing, rhonchi or rales Abd: soft, nontender, no hepatomegaly Ext: 2+ edema Skin: warm and dry Neuro:  CNs 2-12 intact, no focal abnormalities noted       ASSESSMENT AND PLAN:  1. PAF now back in afib with controlled VR.  He thinks he went back into afib after his hernia surgery.  - continue Xarelto and Betapace  - since he was off of Xarelto for more than 48 hours I have recommended that we wait 4 weeks before DCCV.  I will see him back in early January after the holidays and if still in afib will set up for DCCV 2. HTN - mildly elevated today  - continue doxazosin 3. Chronic LE edema  -  increase Lasix 40mg  1 tablet twice daily for 3 days then 1 tablet daily  Followup with me in 6 months  Signed, Armanda Magic, MD 08/27/2013 9:34 AM

## 2013-08-27 NOTE — Patient Instructions (Addendum)
Your physician has recommended you make the following change in your medication: increase Lasix 40mg  1 tablet twice daily for 3 days then 1 tablet daily.  Your physician recommends that you schedule a follow-up appointment the first week of January 2015

## 2013-09-02 ENCOUNTER — Encounter (INDEPENDENT_AMBULATORY_CARE_PROVIDER_SITE_OTHER): Payer: Self-pay | Admitting: Surgery

## 2013-09-02 ENCOUNTER — Ambulatory Visit (INDEPENDENT_AMBULATORY_CARE_PROVIDER_SITE_OTHER): Payer: Medicare Other | Admitting: Surgery

## 2013-09-02 VITALS — BP 127/85 | HR 80 | Temp 98.1°F | Resp 18 | Ht 70.0 in | Wt 212.6 lb

## 2013-09-02 DIAGNOSIS — K4090 Unilateral inguinal hernia, without obstruction or gangrene, not specified as recurrent: Secondary | ICD-10-CM

## 2013-09-02 NOTE — Progress Notes (Signed)
General Surgery [facility]  Chief Complaint  Patient presents with  . Routine Post Op    Left inguinal hernia repair on 08/21/2013    HISTORY: The patient returns for her first postoperative visit having undergone left inguinal hernia repair with mesh less than 2 weeks ago. Patient had sigmoid colon within a large indirect inguinal hernia sac. He required an extensive repair. He has noted marked edema and postoperative swelling. This is now improving. He is having normal bowel movements. His mobility is nearly normal.  EXAM: Surgical incision is healing nicely. Steri-Strips remain in place. Moderate soft tissue swelling at the surgical site and extending into the left hemiscrotum. No sign of recurrence with cough or Valsalva.  IMPRESSION: Status post left inguinal hernia repair with mesh  PLAN: Patient will begin applying topical creams to his incisions. He is restricted in his lifting for 2-3 more weeks. He will return to see me for wound check in 6 weeks.  Velora Heckler, MD, FACS General & Endocrine Surgery [facility]   Visit Diagnoses: 1. Left inguinal hernia, indirect

## 2013-09-02 NOTE — Patient Instructions (Signed)
  COCOA BUTTER & VITAMIN E CREAM  ([employer]mer's or other brand)  Apply cocoa butter/vitamin E cream to your incision 2 - 3 times daily.  Massage cream into incision for one minute with each application.  Use sunscreen (50 SPF or higher) for first 6 months after surgery if area is exposed to sun.  You may substitute Mederma or other scar reducing creams as desired.   

## 2013-09-23 DIAGNOSIS — D510 Vitamin B12 deficiency anemia due to intrinsic factor deficiency: Secondary | ICD-10-CM | POA: Diagnosis not present

## 2013-09-30 ENCOUNTER — Other Ambulatory Visit: Payer: Medicare Other

## 2013-10-06 ENCOUNTER — Ambulatory Visit: Payer: Medicare Other | Admitting: Cardiology

## 2013-10-21 ENCOUNTER — Encounter: Payer: Self-pay | Admitting: General Surgery

## 2013-10-21 ENCOUNTER — Encounter: Payer: Self-pay | Admitting: Cardiology

## 2013-10-21 ENCOUNTER — Ambulatory Visit (INDEPENDENT_AMBULATORY_CARE_PROVIDER_SITE_OTHER): Payer: Medicare Other | Admitting: Cardiology

## 2013-10-21 ENCOUNTER — Telehealth: Payer: Self-pay | Admitting: Cardiology

## 2013-10-21 VITALS — BP 124/72 | HR 58 | Ht 70.0 in | Wt 201.0 lb

## 2013-10-21 DIAGNOSIS — Z7901 Long term (current) use of anticoagulants: Secondary | ICD-10-CM

## 2013-10-21 DIAGNOSIS — Z79899 Other long term (current) drug therapy: Secondary | ICD-10-CM

## 2013-10-21 DIAGNOSIS — R609 Edema, unspecified: Secondary | ICD-10-CM

## 2013-10-21 DIAGNOSIS — I10 Essential (primary) hypertension: Secondary | ICD-10-CM | POA: Diagnosis not present

## 2013-10-21 DIAGNOSIS — R600 Localized edema: Secondary | ICD-10-CM

## 2013-10-21 DIAGNOSIS — I4891 Unspecified atrial fibrillation: Secondary | ICD-10-CM | POA: Diagnosis not present

## 2013-10-21 DIAGNOSIS — I480 Paroxysmal atrial fibrillation: Secondary | ICD-10-CM

## 2013-10-21 LAB — CBC
HCT: 39 % (ref 39.0–52.0)
Hemoglobin: 12.7 g/dL — ABNORMAL LOW (ref 13.0–17.0)
MCHC: 32.5 g/dL (ref 30.0–36.0)
MCV: 86.4 fl (ref 78.0–100.0)
Platelets: 158 10*3/uL (ref 150.0–400.0)
RBC: 4.52 Mil/uL (ref 4.22–5.81)
RDW: 16.6 % — ABNORMAL HIGH (ref 11.5–14.6)
WBC: 6 10*3/uL (ref 4.5–10.5)

## 2013-10-21 LAB — CREATININE, SERUM: CREATININE: 1 mg/dL (ref 0.4–1.5)

## 2013-10-21 NOTE — Patient Instructions (Signed)
Your physician recommends that you continue on your current medications as directed. Please refer to the Current Medication list given to you today.  Your physician recommends that you go to the lab today for a BMET  Your physician has recommended that you have a Cardioversion (DCCV). Electrical Cardioversion uses a jolt of electricity to your heart either through paddles or wired patches attached to your chest. This is a controlled, usually prescheduled, procedure. Defibrillation is done under light anesthesia in the hospital, and you usually go home the day of the procedure. This is done to get your heart back into a normal rhythm. You are not awake for the procedure. Please see the instruction sheet given to you today. You Are scheduled for Friday 10/24/13 at 10:30 AM

## 2013-10-21 NOTE — Telephone Encounter (Signed)
Sending to Dr Radford Pax to make aware procedure moved to 11:00

## 2013-10-21 NOTE — Telephone Encounter (Signed)
Called the Hospital and since pt can not be there until 8:45-9 we R/s the time on Jan 23rd to be 11 :00 am

## 2013-10-21 NOTE — Telephone Encounter (Signed)
Follow up         Pt wants to know if he can be a few minutes late on Friday Jan 23rd for procedure

## 2013-10-21 NOTE — Progress Notes (Signed)
[place], [place] [place], [place]  [address] Phone: [phone] Fax:  [phone]  Date:  10/21/2013   ID:  Cameron [name], DOB 01/09/1934, MRN [id number]  PCP:  Simona Huh, MD  Cardiologist:  Fransico Him, MD     History of Present Illness: Cameron [name] is a 80 y.o. male with a history of PAF, systemic anticoagulation and HTN presents today for followup. He is doing well from a cardiac standpoint.  He denies any chest pain, SOB, DOE, LE edema controlled with TED hose stockings), dizziness, palpitations or syncope. When I last saw him he was back in atrial fibrillation after having hernia surgery.  He had been off Xarelto for surgery so he restarted it and he is now back for followup to be set up for DCCV.      Wt Readings from Last 3 Encounters:  10/21/13 201 lb (91.173 kg)  09/02/13 212 lb 9.6 oz (96.435 kg)  08/27/13 214 lb 9.6 oz (97.342 kg)     Past Medical History  Diagnosis Date  . Bradycardia     asymptomatic  . Rosacea   . Gout     in 70's  . Vitamin B12 deficiency   . Anemia   . Arthritis   . Neuromuscular disorder   . Peripheral neuropathy     both feet, can feel pain  . Anxiety     at times  . Headache(784.0)     hx of, none recent  . Hypertension   . Atrial fibrillation     s/p DCCV  . Atrial flutter   . Bradycardia   . Chronic anticoagulation     Current Outpatient Prescriptions  Medication Sig Dispense Refill  . bimatoprost (LUMIGAN) 0.01 % SOLN Place 1 drop into both eyes at bedtime.       . cyanocobalamin (,VITAMIN B-12,) 1000 MCG/ML injection Inject 1,000 mcg into the muscle every 30 (thirty) days.       Marland Kitchen doxazosin (CARDURA) 4 MG tablet Take 4 mg by mouth daily. Take one tablet daily for BP      . fish oil-omega-3 fatty acids 1000 MG capsule Take 1 g by mouth 2 (two) times daily.       . folic acid (FOLVITE) 1 MG tablet Take 1 mg by mouth daily.       . furosemide (LASIX) 40 MG tablet Take 40 mg by mouth daily as needed for  fluid.       Marland Kitchen gabapentin (NEURONTIN) 300 MG capsule Take 300 mg by mouth 2 (two) times daily. 2-3 times daily      . meloxicam (MOBIC) 15 MG tablet Take 7.5 mg by mouth as needed for pain.       . Rivaroxaban (XARELTO) 20 MG TABS Take 1 tablet by mouth daily.       . sotalol (BETAPACE) 120 MG tablet Take 120 mg by mouth 2 (two) times daily.      . traMADol (ULTRAM) 50 MG tablet Take 50 mg by mouth every 4 (four) hours as needed for moderate pain or severe pain. Maximum dose= 8 tablets per day       No current facility-administered medications for this visit.    Allergies:    Allergies  Allergen Reactions  . Ace Inhibitors     Lips inflated to about "20 pound"  . Clonidine Derivatives Rash    Social History:  The patient  reports that he quit smoking about 39 years ago. His  smoking use included Cigarettes. He smoked 2.00 packs per day. He has never used smokeless tobacco. He reports that he drinks alcohol. He reports that he does not use illicit drugs.   Family History:  The patient's family history includes Cancer in his father; Dementia in his sister; Stroke (age of onset: 60) in his mother.   ROS:  Please see the history of present illness.      All other systems reviewed and negative.   PHYSICAL EXAM: VS:  BP 124/72  Pulse 58  Ht 5\' 10"  (1.778 m)  Wt 201 lb (91.173 kg)  BMI 28.84 kg/m2 Well nourished, well developed, in no acute distress HEENT: normal Neck: no JVD Cardiac:  normal S1, S2; irregularly irregular; no murmur Lungs:  clear to auscultation bilaterally, no wheezing, rhonchi or rales Abd: soft, nontender, no hepatomegaly Ext: no edema Skin: warm and dry Neuro:  CNs 2-12 intact, no focal abnormalities noted    ASSESSMENT AND PLAN:  1. PAF after hernia surgery - now waiting for DCCV after starting back on Xarelto - he has not missed any doses of Xarelto since I saw him in November after his surgery  - continue Xarelto/sotolol  - plan DCCV - he will check with  his son and I will set him up for DCCV 2. HTN well controlled  - continue Doxazosin 3. Chronic LE edema controlled  - continue Lasix   Signed, Fransico Him, MD 10/21/2013 8:31 AM

## 2013-10-21 NOTE — Telephone Encounter (Signed)
New message     Talk to [facility] regarding appt for procedure at the hosp that was made this am

## 2013-10-22 ENCOUNTER — Encounter (HOSPITAL_COMMUNITY): Payer: Self-pay | Admitting: Pharmacy Technician

## 2013-10-22 ENCOUNTER — Encounter: Payer: Self-pay | Admitting: General Surgery

## 2013-10-22 ENCOUNTER — Ambulatory Visit (INDEPENDENT_AMBULATORY_CARE_PROVIDER_SITE_OTHER): Payer: Medicare Other | Admitting: Surgery

## 2013-10-22 ENCOUNTER — Encounter (INDEPENDENT_AMBULATORY_CARE_PROVIDER_SITE_OTHER): Payer: Self-pay | Admitting: Surgery

## 2013-10-22 VITALS — BP 112/76 | HR 80 | Temp 97.3°F | Resp 16 | Ht 70.0 in | Wt 201.4 lb

## 2013-10-22 DIAGNOSIS — D510 Vitamin B12 deficiency anemia due to intrinsic factor deficiency: Secondary | ICD-10-CM | POA: Diagnosis not present

## 2013-10-22 DIAGNOSIS — K4090 Unilateral inguinal hernia, without obstruction or gangrene, not specified as recurrent: Secondary | ICD-10-CM

## 2013-10-22 NOTE — Patient Instructions (Signed)
  COCOA BUTTER & VITAMIN E CREAM  ([employer]mer's or other brand)  Apply cocoa butter/vitamin E cream to your incision 2 - 3 times daily.  Massage cream into incision for one minute with each application.  Use sunscreen (50 SPF or higher) for first 6 months after surgery if area is exposed to sun.  You may substitute Mederma or other scar reducing creams as desired.   

## 2013-10-22 NOTE — Progress Notes (Signed)
General Surgery [facility]  Chief Complaint  Patient presents with  . Routine Post Op    LIH repair 08/21/2013    HISTORY: Patient returns for wound check having undergone left inguinal hernia repair with mesh in November 2014. Postoperative course has been relatively uneventful. He continues to be evaluated by his cardiologist for paroxysmal atrial fibrillation.  Patient does have a known right inguinal hernia. This is largely asymptomatic at the present time.  EXAM: Well-healed left inguinal wound. Palpation in the left inguinal canal with cough and Valsalva shows no sign of recurrence. No significant tenderness. Palpation in the right inguinal canal shows a direct hernia which is reducible and nontender.  IMPRESSION: #1 status post left inguinal hernia repair with mesh #2 right inguinal hernia, reducible, minimally symptomatic  PLAN: Patient is doing well. He is scheduled for cardioversion later this week for atrial fibrillation.  Patient will return in April 2015 for assessment. At that time we will consider scheduling him for right inguinal hernia repair.  Earnstine Regal, MD, [facility]   Visit Diagnoses: 1. Left inguinal hernia, indirect

## 2013-10-24 ENCOUNTER — Encounter (HOSPITAL_COMMUNITY)
Admission: RE | Disposition: A | Discharge status: Home / Self Care | Payer: Self-pay | Source: Ambulatory Visit | Attending: Cardiology

## 2013-10-24 ENCOUNTER — Ambulatory Visit (HOSPITAL_COMMUNITY)
Admission: RE | Admit: 2013-10-24 | Discharge: 2013-10-24 | Disposition: A | Discharge status: Home / Self Care | Payer: Medicare Other | Source: Ambulatory Visit | Attending: Cardiology | Admitting: Cardiology

## 2013-10-24 ENCOUNTER — Encounter (HOSPITAL_COMMUNITY): Payer: Medicare Other | Admitting: Certified Registered"

## 2013-10-24 ENCOUNTER — Other Ambulatory Visit: Payer: Self-pay | Admitting: General Surgery

## 2013-10-24 ENCOUNTER — Ambulatory Visit (HOSPITAL_COMMUNITY): Payer: Medicare Other | Admitting: Certified Registered"

## 2013-10-24 ENCOUNTER — Encounter (HOSPITAL_COMMUNITY): Payer: Self-pay

## 2013-10-24 ENCOUNTER — Telehealth: Payer: Self-pay | Admitting: Cardiology

## 2013-10-24 DIAGNOSIS — R001 Bradycardia, unspecified: Secondary | ICD-10-CM

## 2013-10-24 DIAGNOSIS — I4892 Unspecified atrial flutter: Secondary | ICD-10-CM | POA: Insufficient documentation

## 2013-10-24 DIAGNOSIS — Z87891 Personal history of nicotine dependence: Secondary | ICD-10-CM | POA: Insufficient documentation

## 2013-10-24 DIAGNOSIS — I4891 Unspecified atrial fibrillation: Secondary | ICD-10-CM | POA: Diagnosis not present

## 2013-10-24 DIAGNOSIS — F411 Generalized anxiety disorder: Secondary | ICD-10-CM | POA: Insufficient documentation

## 2013-10-24 DIAGNOSIS — I10 Essential (primary) hypertension: Secondary | ICD-10-CM | POA: Diagnosis not present

## 2013-10-24 DIAGNOSIS — R609 Edema, unspecified: Secondary | ICD-10-CM | POA: Diagnosis not present

## 2013-10-24 DIAGNOSIS — M109 Gout, unspecified: Secondary | ICD-10-CM | POA: Insufficient documentation

## 2013-10-24 DIAGNOSIS — G4730 Sleep apnea, unspecified: Secondary | ICD-10-CM | POA: Insufficient documentation

## 2013-10-24 DIAGNOSIS — Z7901 Long term (current) use of anticoagulants: Secondary | ICD-10-CM | POA: Diagnosis not present

## 2013-10-24 DIAGNOSIS — G609 Hereditary and idiopathic neuropathy, unspecified: Secondary | ICD-10-CM | POA: Diagnosis not present

## 2013-10-24 DIAGNOSIS — I498 Other specified cardiac arrhythmias: Secondary | ICD-10-CM | POA: Diagnosis not present

## 2013-10-24 HISTORY — PX: CARDIOVERSION: SHX1299

## 2013-10-24 LAB — BASIC METABOLIC PANEL
BUN: 17 mg/dL (ref 6–23)
CO2: 26 meq/L (ref 19–32)
Calcium: 9 mg/dL (ref 8.4–10.5)
Chloride: 101 mEq/L (ref 96–112)
Creatinine, Ser: 0.9 mg/dL (ref 0.50–1.35)
GFR calc Af Amer: >90 mL/min (ref >90)
GFR, EST NON AFRICAN AMERICAN: 80 mL/min — AB (ref >90)
GLUCOSE: 131 mg/dL — AB (ref 70–99)
POTASSIUM: 4.1 meq/L (ref 3.7–5.3)
SODIUM: 141 meq/L (ref 137–147)

## 2013-10-24 SURGERY — CARDIOVERSION
Anesthesia: Monitor Anesthesia Care

## 2013-10-24 MED ORDER — SODIUM CHLORIDE 0.9 % IV SOLN: INTRAVENOUS | Status: DC

## 2013-10-24 MED ORDER — PROPOFOL 10 MG/ML IV BOLUS
INTRAVENOUS | Status: DC | PRN
  Administered 2013-10-24: 60 mg via INTRAVENOUS

## 2013-10-24 NOTE — Anesthesia Preprocedure Evaluation (Addendum)
Anesthesia Evaluation  Patient identified by MRN, date of birth, ID band Patient awake    Reviewed: Allergy & Precautions, H&P , NPO status , Patient's Chart, lab work & pertinent test results, reviewed documented beta blocker date and time   History of Anesthesia Complications Negative for: history of anesthetic complications  Airway Mallampati: II TM Distance: >3 FB Neck ROM: Full    Dental  (+) Partial Lower and Edentulous Upper   Pulmonary former smoker (quit 1976),  breath sounds clear to auscultation        Cardiovascular hypertension, Pt. on home beta blockers and Pt. on medications + dysrhythmias Atrial Fibrillation Rhythm:Irregular Rate:Normal  '12 ECHO: normal LVF, EF 56%, valves OK   Neuro/Psych  Headaches, Anxiety    GI/Hepatic negative GI ROS, Neg liver ROS,   Endo/Other  negative endocrine ROS  Renal/GU negative Renal ROS     Musculoskeletal   Abdominal   Peds  Hematology negative hematology ROS (+)   Anesthesia Other Findings   Reproductive/Obstetrics                         Anesthesia Physical Anesthesia Plan  ASA: III  Anesthesia Plan: General   Post-op Pain Management:    Induction: Intravenous  Airway Management Planned: Simple Face Mask  Additional Equipment:   Intra-op Plan:   Post-operative Plan:   Informed Consent: I have reviewed the patients History and Physical, chart, labs and discussed the procedure including the risks, benefits and alternatives for the proposed anesthesia with the patient or authorized representative who has indicated his/her understanding and acceptance.     Plan Discussed with: Anesthesiologist, Surgeon and CRNA  Anesthesia Plan Comments: (Plan routine monitors, GA for cardioversion)       Anesthesia Quick Evaluation

## 2013-10-24 NOTE — Anesthesia Postprocedure Evaluation (Signed)
  Anesthesia Post-op Note  Patient: Cameron [name]  Procedure(s) Performed: Procedure(s): CARDIOVERSION (N/A)  Patient Location: Endoscopy Unit  Anesthesia Type:General  Level of Consciousness: awake, alert , oriented and patient cooperative  Airway and Oxygen Therapy: Patient Spontanous Breathing and Patient connected to nasal cannula oxygen  Post-op Pain: none  Post-op Assessment: Post-op Vital signs reviewed, Patient's Cardiovascular Status Stable, Respiratory Function Stable, Patent Airway, No signs of Nausea or vomiting and Pain level controlled  Post-op Vital Signs: Reviewed and stable  Complications: No apparent anesthesia complications

## 2013-10-24 NOTE — Discharge Instructions (Addendum)
Moderate Sedation, Adult °Moderate sedation is given to help you relax or even sleep through a procedure. You may remain sleepy, be clumsy, or have poor balance for several hours following this procedure. Arrange for a responsible adult, family member, or friend to take you home. A responsible adult should stay with you for at least 24 hours or until the medicines have worn off. °· Do not participate in any activities where you could become injured for the next 24 hours, or until you feel normal again. Do not: °· Drive. °· Swim. °· Ride a bicycle. °· Operate heavy machinery. °· Cook. °· Use power tools. °· Climb ladders. °· Work at heights. °· Do not make important decisions or sign legal documents until you are improved. °· Vomiting may occur if you eat too soon. When you can drink without vomiting, try water, juice, or soup. Try solid foods if you feel little or no nausea. °· Only take over-the-counter or prescription medications for pain, discomfort, or fever as directed by your caregiver.If pain medications have been prescribed for you, ask your caregiver how soon it is safe to take them. °· Make sure you and your family fully understands everything about the medication given to you. Make sure you understand what side effects may occur. °· You should not drink alcohol, take sleeping pills, or medications that cause drowsiness for at least 24 hours. °· If you smoke, do not smoke alone. °· If you are feeling better, you may resume normal activities 24 hours after receiving sedation. °· Keep all appointments as scheduled. Follow all instructions. °· Ask questions if you do not understand. °SEEK MEDICAL CARE IF:  °· Your skin is pale or bluish in color. °· You continue to feel sick to your stomach (nauseous) or throw up (vomit). °· Your pain is getting worse and not helped by medication. °· You have bleeding or swelling. °· You are still sleepy or feeling clumsy after 24 hours. °SEEK IMMEDIATE MEDICAL CARE IF:   °· You develop a rash. °· You have difficulty breathing. °· You develop any type of allergic problem. °· You have a fever. °Document Released: 06/13/2001 Document Revised: 12/11/2011 Document Reviewed: 05/26/2013 °ExitCare® Patient Information ©2014 [employer]. ° °

## 2013-10-24 NOTE — Telephone Encounter (Signed)
Follow UP:  Pt's son, Belenda Cruise, is returning a call to [place].

## 2013-10-24 NOTE — Transfer of Care (Signed)
Immediate Anesthesia Transfer of Care Note  Patient: Cameron [name]  Procedure(s) Performed: Procedure(s): CARDIOVERSION (N/A)  Patient Location: Endoscopy Unit  Anesthesia Type:General  Level of Consciousness: awake, alert , oriented and patient cooperative  Airway & Oxygen Therapy: Patient Spontanous Breathing and Patient connected to nasal cannula oxygen  Post-op Assessment: Report given to PACU RN, Post -op Vital signs reviewed and stable and Patient moving all extremities  Post vital signs: Reviewed and stable  Complications: No apparent anesthesia complications

## 2013-10-24 NOTE — CV Procedure (Addendum)
Electrical Cardioversion Procedure Note Cameron [name] [id number] June 01, 1934  Procedure: Electrical Cardioversion Indications:  Atrial Fibrillation  Time Out: Verified patient identification, verified procedure,medications/allergies/relevent history reviewed, required imaging and test results available.  Performed  Procedure Details  The patient was NPO after midnight. Anesthesia was administered at the beside  by Dr.Jackson with 60mg  of propofol.  Cardioversion was done with synchronized biphasic defibrillation with AP pads with 150watts.  The patient converted to normal sinus rhythm. The patient tolerated the procedure well   IMPRESSION:  Successful cardioversion of atrial fibrillation  Patient was noted to have periods of apnea during sleep.  Will set him up for outpt split night PSG  [name] 10/24/2013, 10:56 AM

## 2013-10-24 NOTE — Telephone Encounter (Signed)
LVM for pt to return call. Filled out sleep form. Handed to [place] to fax over information.

## 2013-10-24 NOTE — H&P (Signed)
ID: Cameron [name], DOB Feb 18, 1934, MRN [id number]  PCP: Simona Huh, MD  Cardiologist: Fransico Him, MD  History of Present Illness:  Cameron [name] is a 80 y.o. male with a history of PAF, systemic anticoagulation and HTN presents today for followup. He is doing well from a cardiac standpoint. He denies any chest pain, SOB, DOE, LE edema controlled with TED hose stockings), dizziness, palpitations or syncope. When I last saw him he was back in atrial fibrillation after having hernia surgery. He had been off Xarelto for surgery so he restarted it and he is now back for followup to be set up for DCCV.   Wt Readings from Last 3 Encounters:   10/21/13  201 lb (91.173 kg)   09/02/13  212 lb 9.6 oz (96.435 kg)   08/27/13  214 lb 9.6 oz (97.342 kg)    Past Medical History   Diagnosis  Date   .  Bradycardia      asymptomatic   .  Rosacea    .  Gout      in 70's   .  Vitamin B12 deficiency    .  Anemia    .  Arthritis    .  Neuromuscular disorder    .  Peripheral neuropathy      both feet, can feel pain   .  Anxiety      at times   .  Headache(784.0)      hx of, none recent   .  Hypertension    .  Atrial fibrillation      s/p DCCV   .  Atrial flutter    .  Bradycardia    .  Chronic anticoagulation     Current Outpatient Prescriptions   Medication  Sig  Dispense  Refill   .  bimatoprost (LUMIGAN) 0.01 % SOLN  Place 1 drop into both eyes at bedtime.     .  cyanocobalamin (,VITAMIN B-12,) 1000 MCG/ML injection  Inject 1,000 mcg into the muscle every 30 (thirty) days.     Marland Kitchen  doxazosin (CARDURA) 4 MG tablet  Take 4 mg by mouth daily. Take one tablet daily for BP     .  fish oil-omega-3 fatty acids 1000 MG capsule  Take 1 g by mouth 2 (two) times daily.     .  folic acid (FOLVITE) 1 MG tablet  Take 1 mg by mouth daily.     .  furosemide (LASIX) 40 MG tablet  Take 40 mg by mouth daily as needed for fluid.     Marland Kitchen  gabapentin (NEURONTIN) 300 MG capsule  Take 300 mg by mouth 2  (two) times daily. 2-3 times daily     .  meloxicam (MOBIC) 15 MG tablet  Take 7.5 mg by mouth as needed for pain.     .  Rivaroxaban (XARELTO) 20 MG TABS  Take 1 tablet by mouth daily.     .  sotalol (BETAPACE) 120 MG tablet  Take 120 mg by mouth 2 (two) times daily.     .  traMADol (ULTRAM) 50 MG tablet  Take 50 mg by mouth every 4 (four) hours as needed for moderate pain or severe pain. Maximum dose= 8 tablets per day      No current facility-administered medications for this visit.   Allergies:  Allergies   Allergen  Reactions   .  Ace Inhibitors      Lips inflated to about "20  pound"   .  Clonidine Derivatives  Rash   Social History: The patient reports that he quit smoking about 39 years ago. His smoking use included Cigarettes. He smoked 2.00 packs per day. He has never used smokeless tobacco. He reports that he drinks alcohol. He reports that he does not use illicit drugs.  Family History: The patient's family history includes Cancer in his father; Dementia in his sister; Stroke (age of onset: 30) in his mother.  ROS: Please see the history of present illness. All other systems reviewed and negative.  PHYSICAL EXAM:  VS: BP 124/72  Pulse 58  Ht 5\' 10"  (1.778 m)  Wt 201 lb (91.173 kg)  BMI 28.84 kg/m2  Well nourished, well developed, in no acute distress  HEENT: normal  Neck: no JVD  Cardiac: normal S1, S2; irregularly irregular; no murmur  Lungs: clear to auscultation bilaterally, no wheezing, rhonchi or rales  Abd: soft, nontender, no hepatomegaly  Ext: no edema  Skin: warm and dry  Neuro: CNs 2-12 intact, no focal abnormalities noted  ASSESSMENT AND PLAN:  1. PAF after hernia surgery - now waiting for DCCV after starting back on Xarelto - he has not missed any doses of Xarelto since I saw him in November after his surgery - continue Xarelto/sotolol  - plan DCCV - he will check with his son and I will set him up for DCCV  2. HTN well controlled - continue Doxazosin   3. Chronic LE edema controlled - continue Lasix  Signed,  Fransico Him, MD

## 2013-10-24 NOTE — Telephone Encounter (Signed)
Patient had successful DCCV but appeared to have periods of sleep apnea while asleep.  Please set him up for a split night PSG for ICD code  apnea

## 2013-10-24 NOTE — Preoperative (Signed)
Beta Blockers   Reason not to administer Beta Blockers:Not Applicable 

## 2013-10-24 NOTE — Telephone Encounter (Signed)
Pt is aware.  

## 2013-10-27 ENCOUNTER — Encounter (HOSPITAL_COMMUNITY): Payer: Self-pay | Admitting: Cardiology

## 2013-10-28 DIAGNOSIS — H409 Unspecified glaucoma: Secondary | ICD-10-CM | POA: Diagnosis not present

## 2013-10-28 DIAGNOSIS — H4011X0 Primary open-angle glaucoma, stage unspecified: Secondary | ICD-10-CM | POA: Diagnosis not present

## 2013-11-06 ENCOUNTER — Telehealth: Payer: Self-pay | Admitting: Cardiology

## 2013-11-06 NOTE — Telephone Encounter (Signed)
New message         Pt needs more information about his sleep study. Please call pt he has appt set up for Monday night.

## 2013-11-07 NOTE — Telephone Encounter (Signed)
Spoke w Pt. He is aware of where to go for sleep study. He did not receive a packet yet from [facility] so I asked him to come to the office today to pick up a packet. He agreed and said he would come in.

## 2013-11-10 DIAGNOSIS — G4730 Sleep apnea, unspecified: Secondary | ICD-10-CM | POA: Diagnosis not present

## 2013-11-11 ENCOUNTER — Ambulatory Visit (INDEPENDENT_AMBULATORY_CARE_PROVIDER_SITE_OTHER): Payer: Medicare Other | Admitting: Cardiology

## 2013-11-11 ENCOUNTER — Encounter: Payer: Self-pay | Admitting: Cardiology

## 2013-11-11 ENCOUNTER — Other Ambulatory Visit: Payer: Self-pay | Admitting: Cardiology

## 2013-11-11 VITALS — BP 120/70 | HR 61 | Ht 70.0 in | Wt 207.0 lb

## 2013-11-11 DIAGNOSIS — I498 Other specified cardiac arrhythmias: Secondary | ICD-10-CM

## 2013-11-11 DIAGNOSIS — R600 Localized edema: Secondary | ICD-10-CM

## 2013-11-11 DIAGNOSIS — R001 Bradycardia, unspecified: Secondary | ICD-10-CM

## 2013-11-11 DIAGNOSIS — I480 Paroxysmal atrial fibrillation: Secondary | ICD-10-CM

## 2013-11-11 DIAGNOSIS — Z7901 Long term (current) use of anticoagulants: Secondary | ICD-10-CM

## 2013-11-11 DIAGNOSIS — I4892 Unspecified atrial flutter: Secondary | ICD-10-CM | POA: Diagnosis not present

## 2013-11-11 DIAGNOSIS — I4891 Unspecified atrial fibrillation: Secondary | ICD-10-CM

## 2013-11-11 DIAGNOSIS — I10 Essential (primary) hypertension: Secondary | ICD-10-CM

## 2013-11-11 DIAGNOSIS — R609 Edema, unspecified: Secondary | ICD-10-CM

## 2013-11-11 MED ORDER — RIVAROXABAN 20 MG PO TABS: 20.0000 mg | ORAL_TABLET | Freq: Every day | ORAL | Status: DC

## 2013-11-11 MED ORDER — METOPROLOL SUCCINATE ER 25 MG PO TB24: 25.0000 mg | ORAL_TABLET | Freq: Every day | ORAL | Status: DC

## 2013-11-11 NOTE — Patient Instructions (Signed)
Your physician has recommended you make the following change in your medication: 1. Stop Sotolol 2. Start Toprol XL 25 MG 1 tablet Daily  Your physician recommends that you schedule a follow-up appointment in: One week for EKG with Nurse  Your physician recommends that you schedule a follow-up appointment in: 3 Months with Dr Radford Pax

## 2013-11-11 NOTE — Progress Notes (Signed)
[place], Cameron [name], [place]  [address] Phone: [phone] Fax:  [phone]  Date:  11/11/2013   ID:  Cameron [name], DOB 1934/07/27, MRN [id number]  PCP:  Simona Huh, MD  Cardiologist:  Fransico Him, MD     History of Present Illness: Cameron [name] is a 80 y.o. male with a history of PAF, asymptomatic bradycardia, systemic anticoagulation who recently underwent DCCV to NSR on 10/24/2013.  He was noted post cardioversion in recovery to have periods of apnea and was set up for sleep study which he had last night and results are pending at this time.     Wt Readings from Last 3 Encounters:  10/22/13 201 lb 6.4 oz (91.354 kg)  10/21/13 201 lb (91.173 kg)  09/02/13 212 lb 9.6 oz (96.435 kg)     Past Medical History  Diagnosis Date  . Bradycardia     asymptomatic  . Rosacea   . Gout     in 70's  . Vitamin B12 deficiency   . Anemia   . Arthritis   . Neuromuscular disorder   . Peripheral neuropathy     both feet, can feel pain  . Anxiety     at times  . Headache(784.0)     hx of, none recent  . Hypertension   . Atrial fibrillation     s/p DCCV  . Atrial flutter   . Bradycardia   . Chronic anticoagulation     Current Outpatient Prescriptions  Medication Sig Dispense Refill  . bimatoprost (LUMIGAN) 0.01 % SOLN Place 1 drop into both eyes at bedtime.       . cyanocobalamin (,VITAMIN B-12,) 1000 MCG/ML injection Inject 1,000 mcg into the muscle every 30 (thirty) days.       Marland Kitchen doxazosin (CARDURA) 4 MG tablet Take 4 mg by mouth daily. for BP      . fish oil-omega-3 fatty acids 1000 MG capsule Take 1 g by mouth 2 (two) times daily.       . folic acid (FOLVITE) 1 MG tablet Take 1 mg by mouth daily.       . furosemide (LASIX) 40 MG tablet Take 40 mg by mouth daily as needed for fluid.       Marland Kitchen gabapentin (NEURONTIN) 300 MG capsule Take 300 mg by mouth 3 (three) times daily. 2-3 times daily      . meloxicam (MOBIC) 15 MG tablet Take 7.5 mg by mouth daily  as needed for pain.       . Rivaroxaban (XARELTO) 20 MG TABS Take 20 mg by mouth daily.       . sotalol (BETAPACE) 120 MG tablet Take 120 mg by mouth 2 (two) times daily.      . traMADol (ULTRAM) 50 MG tablet Take 50 mg by mouth every 4 (four) hours as needed for moderate pain or severe pain. Maximum dose= 8 tablets per day       No current facility-administered medications for this visit.    Allergies:    Allergies  Allergen Reactions  . Ace Inhibitors     Lips inflated to about "20 pound"  . Clonidine Derivatives Rash    Social History:  The patient  reports that he quit smoking about 39 years ago. His smoking use included Cigarettes. He smoked 2.00 packs per day. He has never used smokeless tobacco. He reports that he drinks alcohol. He reports that he does not use illicit drugs.   Family History:  The patient's family history includes Cancer in his father; Dementia in his sister; Stroke (age of onset: 92) in his mother.   ROS:  Please see the history of present illness.      All other systems reviewed and negative.   PHYSICAL EXAM: VS:  There were no vitals taken for this visit. Well nourished, well developed, in no acute distress HEENT: normal Neck: no JVD Cardiac:  normal S1, S2; irregularly irregular; no murmur Lungs:  clear to auscultation bilaterally, no wheezing, rhonchi or rales Abd: soft, nontender, no hepatomegaly Ext: 1+ edema Skin: warm and dry Neuro:  CNs 2-12 intact, no focal abnormalities noted  EKG:      Atrial flutter with CVR  ASSESSMENT AND PLAN:  1. Atrial fibrillation/flutter s/p recent DCCV but now back in atrial flutter with CVR.  He had seen Dr. Rayann Heman who felt that since he is asymptomatic that he would recommend rhythm control.  The patient remains asymptomatic and is not interested in ablation.  - discontinue betapace  - start Toprol XL 25mg  daily  - continue Xarelto 2. Asymptomatic bradycardia - resolved 3. Probable sleep apnea - await results  of sleep study 4. HTN - well controlled  - continue Doxazosin 5.  Chronic LE edema - controlled on diuretics  Followup with nurse for EKG to make sure HR is controlled Followup with me in 3 months  Signed, Fransico Him, MD 11/11/2013 9:31 AM

## 2013-11-15 ENCOUNTER — Emergency Department (HOSPITAL_COMMUNITY): Payer: Medicare Other

## 2013-11-15 ENCOUNTER — Observation Stay (HOSPITAL_COMMUNITY)
Admission: EM | Admit: 2013-11-15 | Discharge: 2013-11-15 | Disposition: A | Discharge status: Home / Self Care | Payer: Medicare Other | Attending: Internal Medicine | Admitting: Internal Medicine

## 2013-11-15 ENCOUNTER — Encounter (HOSPITAL_COMMUNITY): Payer: Self-pay | Admitting: Emergency Medicine

## 2013-11-15 DIAGNOSIS — I4891 Unspecified atrial fibrillation: Secondary | ICD-10-CM | POA: Diagnosis not present

## 2013-11-15 DIAGNOSIS — R002 Palpitations: Secondary | ICD-10-CM | POA: Insufficient documentation

## 2013-11-15 DIAGNOSIS — G609 Hereditary and idiopathic neuropathy, unspecified: Secondary | ICD-10-CM | POA: Diagnosis not present

## 2013-11-15 DIAGNOSIS — I4892 Unspecified atrial flutter: Secondary | ICD-10-CM

## 2013-11-15 DIAGNOSIS — R079 Chest pain, unspecified: Principal | ICD-10-CM | POA: Insufficient documentation

## 2013-11-15 DIAGNOSIS — I10 Essential (primary) hypertension: Secondary | ICD-10-CM

## 2013-11-15 DIAGNOSIS — Z79899 Other long term (current) drug therapy: Secondary | ICD-10-CM | POA: Insufficient documentation

## 2013-11-15 DIAGNOSIS — Z87891 Personal history of nicotine dependence: Secondary | ICD-10-CM | POA: Diagnosis not present

## 2013-11-15 DIAGNOSIS — I5032 Chronic diastolic (congestive) heart failure: Secondary | ICD-10-CM | POA: Diagnosis not present

## 2013-11-15 DIAGNOSIS — E538 Deficiency of other specified B group vitamins: Secondary | ICD-10-CM | POA: Insufficient documentation

## 2013-11-15 DIAGNOSIS — Z7901 Long term (current) use of anticoagulants: Secondary | ICD-10-CM

## 2013-11-15 DIAGNOSIS — I4821 Permanent atrial fibrillation: Secondary | ICD-10-CM | POA: Diagnosis present

## 2013-11-15 DIAGNOSIS — R0789 Other chest pain: Secondary | ICD-10-CM | POA: Diagnosis not present

## 2013-11-15 LAB — CBC
HCT: 41.7 % (ref 39.0–52.0)
Hemoglobin: 13.8 g/dL (ref 13.0–17.0)
MCH: 28.2 pg (ref 26.0–34.0)
MCHC: 33.1 g/dL (ref 30.0–36.0)
MCV: 85.3 fL (ref 78.0–100.0)
PLATELETS: 144 10*3/uL — AB (ref 150–400)
RBC: 4.89 MIL/uL (ref 4.22–5.81)
RDW: 15 % (ref 11.5–15.5)
WBC: 6.4 10*3/uL (ref 4.0–10.5)

## 2013-11-15 LAB — POCT I-STAT TROPONIN I: Troponin i, poc: 0.02 ng/mL (ref 0.00–0.08)

## 2013-11-15 LAB — TROPONIN I

## 2013-11-15 LAB — BASIC METABOLIC PANEL
BUN: 16 mg/dL (ref 6–23)
CALCIUM: 9.6 mg/dL (ref 8.4–10.5)
CO2: 24 meq/L (ref 19–32)
Chloride: 97 mEq/L (ref 96–112)
Creatinine, Ser: 0.89 mg/dL (ref 0.50–1.35)
GFR calc Af Amer: >90 mL/min (ref >90)
GFR, EST NON AFRICAN AMERICAN: 80 mL/min — AB (ref >90)
Glucose, Bld: 195 mg/dL — ABNORMAL HIGH (ref 70–99)
Potassium: 3.8 mEq/L (ref 3.7–5.3)
Sodium: 137 mEq/L (ref 137–147)

## 2013-11-15 MED ORDER — ONDANSETRON HCL 4 MG/2ML IJ SOLN
4.0000 mg | Freq: Once | INTRAMUSCULAR | Status: DC
  Filled 2013-11-15: qty 2

## 2013-11-15 MED ORDER — TRAMADOL HCL 50 MG PO TABS: 50.0000 mg | ORAL_TABLET | ORAL | Status: DC | PRN

## 2013-11-15 MED ORDER — DOXAZOSIN MESYLATE 4 MG PO TABS
4.0000 mg | ORAL_TABLET | Freq: Every day | ORAL | Status: DC
  Administered 2013-11-15: 4 mg via ORAL
  Filled 2013-11-15: qty 1

## 2013-11-15 MED ORDER — GABAPENTIN 300 MG PO CAPS
300.0000 mg | ORAL_CAPSULE | Freq: Three times a day (TID) | ORAL | Status: DC
  Administered 2013-11-15: 300 mg via ORAL
  Filled 2013-11-15 (×3): qty 1

## 2013-11-15 MED ORDER — METOPROLOL TARTRATE 50 MG PO TABS
50.0000 mg | ORAL_TABLET | Freq: Two times a day (BID) | ORAL | Status: DC
  Administered 2013-11-15: 50 mg via ORAL
  Filled 2013-11-15 (×2): qty 1

## 2013-11-15 MED ORDER — FUROSEMIDE 40 MG PO TABS
40.0000 mg | ORAL_TABLET | Freq: Every day | ORAL | Status: DC
  Administered 2013-11-15: 40 mg via ORAL
  Filled 2013-11-15: qty 1

## 2013-11-15 MED ORDER — MORPHINE SULFATE 2 MG/ML IJ SOLN
2.0000 mg | Freq: Once | INTRAMUSCULAR | Status: DC
  Filled 2013-11-15: qty 1

## 2013-11-15 MED ORDER — RIVAROXABAN 20 MG PO TABS
20.0000 mg | ORAL_TABLET | ORAL | Status: DC
  Filled 2013-11-15: qty 1

## 2013-11-15 NOTE — ED Provider Notes (Signed)
CSN: [id number]     Arrival date & time 11/15/13  0153 History   First MD Initiated Contact with Patient 11/15/13 0220     Chief Complaint  Patient presents with  . Chest Pain  . Palpitations     (Consider location/radiation/quality/duration/timing/severity/associated sxs/prior Treatment) HPI History provided by patient. Left-sided chest pressure onset about 2 hours prior to arrival. Moderate to severe now moderate severity. No associated shortness of breath. No nausea, no diaphoresis, no leg pain or new swelling. Patient does have some mild peripheral edema unchanged. No known aggravating or alleviating factors. Patient to check his blood pressure and his monitor read irregular rhythm. He has paroxysmal atrial fibrillation, on Xarelto. He recently saw his cardiologist and had medication changes, stopped sotalol and started Toprol. He denies ever having similar chest pressure in the past. No known history of heart disease.  Past Medical History  Diagnosis Date  . Bradycardia     asymptomatic  . Rosacea   . Gout     in 70's  . Vitamin B12 deficiency   . Anemia   . Arthritis   . Neuromuscular disorder   . Peripheral neuropathy     both feet, can feel pain  . Anxiety     at times  . Headache(784.0)     hx of, none recent  . Hypertension   . Atrial fibrillation     s/p DCCV  . Atrial flutter   . Bradycardia   . Chronic anticoagulation    Past Surgical History  Procedure Laterality Date  . Hemicolectomy  03/1996  . Cardioversion      x5  . Appendectomy      1997  . Tumor removal Right 1961    back of leg  . Cataract extraction Bilateral   . Inguinal hernia repair Left 08/21/2013    Procedure:  LEFT INGUINAL HERNIA REPAIR WITH MESH;  Surgeon: Earnstine Regal, MD;  Location: [facility];  Service: General;  Laterality: Left;  . Insertion of mesh Left 08/21/2013    Procedure: INSERTION OF MESH;  Surgeon: Earnstine Regal, MD;  Location: [facility];  Service: General;  Laterality: Left;   . Cardioversion N/A 10/24/2013    Procedure: CARDIOVERSION;  Surgeon: Sueanne Margarita, MD;  Location: [facility];  Service: Cardiovascular;  Laterality: N/A;   Family History  Problem Relation Age of Onset  . Stroke Mother 87  . Dementia Sister   . Cancer Father     lung   History  Substance Use Topics  . Smoking status: Former Smoker -- 2.00 packs/day    Types: Cigarettes    Quit date: 10/02/1974  . Smokeless tobacco: Never Used  . Alcohol Use: Yes     Comment: 1-2 beers per day    Review of Systems  Constitutional: Negative for fever and chills.  Eyes: Negative for visual disturbance.  Respiratory: Negative for shortness of breath.   Cardiovascular: Positive for chest pain.  Gastrointestinal: Negative for vomiting and abdominal pain.  Genitourinary: Negative for flank pain.  Musculoskeletal: Negative for back pain and neck pain.  Skin: Negative for rash.  Neurological: Negative for headaches.  All other systems reviewed and are negative.      Allergies  Ace inhibitors and Clonidine derivatives  Home Medications   Current Outpatient Rx  Name  Route  Sig  Dispense  Refill  . bimatoprost (LUMIGAN) 0.01 % SOLN   Both Eyes   Place 1 drop into both eyes at bedtime.          Marland Kitchen  cyanocobalamin (,VITAMIN B-12,) 1000 MCG/ML injection   Intramuscular   Inject 1,000 mcg into the muscle every 30 (thirty) days.          Marland Kitchen doxazosin (CARDURA) 4 MG tablet   Oral   Take 4 mg by mouth daily. for BP         . fish oil-omega-3 fatty acids 1000 MG capsule   Oral   Take 1 g by mouth 2 (two) times daily.          . folic acid (FOLVITE) 1 MG tablet   Oral   Take 1 mg by mouth daily.          . furosemide (LASIX) 40 MG tablet      TAKE 1 TABLET BY MOUTH EVERY DAY AND EXTRA AS DIRECTED   100 tablet   0   . gabapentin (NEURONTIN) 300 MG capsule   Oral   Take 300 mg by mouth 3 (three) times daily. 2-3 times daily         . meloxicam (MOBIC) 15 MG tablet    Oral   Take 7.5 mg by mouth daily as needed for pain.          . metoprolol succinate (TOPROL XL) 25 MG 24 hr tablet   Oral   Take 1 tablet (25 mg total) by mouth daily.   90 tablet   3   . Rivaroxaban (XARELTO) 20 MG TABS tablet   Oral   Take 1 tablet (20 mg total) by mouth daily.   90 tablet   3   . traMADol (ULTRAM) 50 MG tablet   Oral   Take 50 mg by mouth every 4 (four) hours as needed for moderate pain or severe pain. Maximum dose= 8 tablets per day          BP 146/74  Pulse 93  Temp(Src) 97.8 F (36.6 C) (Oral)  Resp 14  Ht 5\' 10"  (1.778 m)  Wt 207 lb (93.895 kg)  BMI 29.70 kg/m2  SpO2 97% Physical Exam  Constitutional: He is oriented to person, place, and time. He appears well-developed and well-nourished.  HENT:  Head: Normocephalic and atraumatic.  Eyes: EOM are normal. Pupils are equal, round, and reactive to light.  Neck: Neck supple.  Cardiovascular: Intact distal pulses.   Irregular  Pulmonary/Chest: Effort normal and breath sounds normal. No respiratory distress. He exhibits no tenderness.  Abdominal: Soft. He exhibits no distension. There is no tenderness.  Musculoskeletal: Normal range of motion. He exhibits no tenderness.  Mild symmetric lower extremity peripheral edema  Neurological: He is alert and oriented to person, place, and time.  Skin: Skin is warm and dry.    ED Course  Procedures (including critical care time) Labs Review Labs Reviewed  CBC - Abnormal; Notable for the following:    Platelets 144 (*)    All other components within normal limits  BASIC METABOLIC PANEL - Abnormal; Notable for the following:    Glucose, Bld 195 (*)    GFR calc non Af Amer 80 (*)    All other components within normal limits  POCT I-STAT TROPONIN I   Imaging Review Dg Chest Portable 1 View  11/15/2013   CLINICAL DATA:  Left-sided chest pain and palpitations.  EXAM: PORTABLE CHEST - 1 VIEW  COMPARISON:  08/18/2013  FINDINGS: Enlargement of the  cardiac silhouette is unchanged. There is improved aeration of the right lower lung. There is no evidence of new airspace consolidation, overt edema, large  pleural effusion, or pneumothorax. Mild bilateral acromioclavicular joint osteoarthrosis is noted. Multilevel osteophytosis is present in the thoracic spine.  IMPRESSION: Improved aeration of the right lower lung. No definite evidence of acute airspace disease.   Electronically Signed   By: Logan Bores   On: 11/15/2013 03:07    EKG Interpretation    Date/Time:  Saturday November 15 2013 02:06:44 EST Ventricular Rate:  103 PR Interval:    QRS Duration: 80 QT Interval:  357 QTC Calculation: 467 R Axis:   32 Text Interpretation:  Atrial fibrillation Nonspecific ST abnormality Abnormal ECG Confirmed by Rishab Stoudt  MD, Ahuva Poynor ([id number]) on 11/15/2013 2:21:52 AM           IV morphine. IV Zofran  Discussed with [facility] hospital[facility], DR Posey Pronto to admit  MDM   Diagnosis: Chest pain, paroxysmal atrial fibrillation  Evaluated with EKG, chest x-ray and labs reviewed as above. Plan admit medicine, cycle cardiac enzymes. IV narcotics pain control.     Teressa Lower, MD 11/15/13 [phone]

## 2013-11-15 NOTE — Discharge Instructions (Signed)
Chest Pain (Nonspecific) Chest pain has many causes. Your pain could be caused by something serious, such as a heart attack or a blood clot in the lungs. It could also be caused by something less serious, such as a chest bruise or a virus. Follow up with your doctor. More lab tests or other studies may be needed to find the cause of your pain. Most of the time, nonspecific chest pain will improve within 2 to 3 days of rest and mild pain medicine. HOME CARE  For chest bruises, you may put ice on the sore area for 15-20 minutes, 03-04 times a day. Do this only if it makes you feel better.  Put ice in a plastic bag.  Place a towel between the skin and the bag.  Rest for the next 2 to 3 days.  Go back to work if the pain improves.  See your doctor if the pain lasts longer than 1 to 2 weeks.  Only take medicine as told by your doctor.  Quit smoking if you smoke. GET HELP RIGHT AWAY IF:   There is more pain or pain that spreads to the arm, neck, jaw, back, or belly (abdomen).  You have shortness of breath.  You cough more than usual or cough up blood.  You have very bad back or belly pain, feel sick to your stomach (nauseous), or throw up (vomit).  You have very bad weakness.  You pass out (faint).  You have a fever. Any of these problems may be serious and may be an emergency. Do not wait to see if the problems will go away. Get medical help right away. Call your local emergency services 911 in [place] Do not drive yourself to the hospital. MAKE SURE YOU:   Understand these instructions.  Will watch this condition.  Will get help right away if you or your child is not doing well or gets worse. Document Released: 03/06/2008 Document Revised: 12/11/2011 Document Reviewed: 03/06/2008 [facility] Patient Information 2014 [place], [place]. Atrial Fibrillation Atrial fibrillation is a type of irregular heart rhythm (arrhythmia). During atrial fibrillation, the upper chambers of the heart  (atria) quiver continuously in a chaotic pattern. This causes an irregular and often rapid heart rate.  Atrial fibrillation is the result of the heart becoming overloaded with disorganized signals that tell it to beat. These signals are normally released one at a time by a part of the right atrium called the sinoatrial node. They then travel from the atria to the lower chambers of the heart (ventricles), causing the atria and ventricles to contract and pump blood as they pass. In atrial fibrillation, parts of the atria outside of the sinoatrial node also release these signals. This results in two problems. First, the atria receive so many signals that they do not have time to fully contract. Second, the ventricles, which can only receive one signal at a time, beat irregularly and out of rhythm with the atria.  There are three types of atrial fibrillation:   Paroxysmal Paroxysmal atrial fibrillation starts suddenly and stops on its own within a week.   Persistent Persistent atrial fibrillation lasts for more than a week. It may stop on its own or with treatment.   Permanent Permanent atrial fibrillation does not go away. Episodes of atrial fibrillation may lead to permanent atrial fibrillation.  Atrial fibrillation can prevent your heart from pumping blood normally. It increases your risk of stroke and can lead to heart failure.  CAUSES   Heart conditions,  including a heart attack, heart failure, coronary artery disease, and heart valve conditions.   Inflammation of the sac that surrounds the heart (pericarditis).   Blockage of an artery in the lungs (pulmonary embolism).   Pneumonia or other infections.   Chronic lung disease.   Thyroid problems, especially if the thyroid is overactive (hyperthyroidism).   Caffeine, excessive alcohol use, and use of some illegal drugs.   Use of some medications, including certain decongestants and diet pills.   Heart surgery.   Birth  defects.  Sometimes, no cause can be found. When this happens, the atrial fibrillation is called lone atrial fibrillation. The risk of complications from atrial fibrillation increases if you have lone atrial fibrillation and you are age 58 years or older. RISK FACTORS  Heart failure.  Coronary artery disease  Diabetes mellitus.   High blood pressure (hypertension).   Obesity.   Other arrhythmias.   Increased age. SYMPTOMS   A feeling that your heart is beating rapidly or irregularly.   A feeling of discomfort or pain in your chest.   Shortness of breath.   Sudden lightheadedness or weakness.   Getting tired easily when exercising.   Urinating more often than normal (mainly when atrial fibrillation first begins).  In paroxysmal atrial fibrillation, symptoms may start and suddenly stop. DIAGNOSIS  Your caregiver may be able to detect atrial fibrillation when taking your pulse. Usually, testing is needed to diagnosis atrial fibrillation. Tests may include:   Electrocardiography. During this test, the electrical impulses of your heart are recorded while you are lying down.   Echocardiography. During echocardiography, sound waves are used to evaluate how blood flows through your heart.   Stress test. There is more than one type of stress test. If a stress test is needed, ask your caregiver about which type is best for you.   Chest X-ray exam.   Blood tests.   Computed tomography (CT).  TREATMENT   Treating any underlying conditions. For example, if you have an overactive thyroid, treating the condition may correct atrial fibrillation.   Medication. Medications may be given to control a rapid heart rate or to prevent blood clots, heart failure, or a stroke.   Procedure to correct the rhythm of the heart:  Electrical cardioversion. During electrical cardioversion, a controlled, low-energy shock is delivered to the heart through your skin. If you have  chest pain, very low pressure blood pressure, or sudden heart failure, this procedure may need to be done as an emergency.  Catheter ablation. During this procedure, heart tissues that send the signals that cause atrial fibrillation are destroyed.  Maze or minimaze procedure. During this surgery, thin lines of heart tissue that carry the abnormal signals are destroyed. The maze procedure is an open-heart surgery. The minimaze procedure is a minimally invasive surgery. This means that small cuts are made to access the heart instead of a large opening.  Pulmonary venous isolation. During this surgery, tissue around the veins that carry blood from the lungs (pulmonary veins) is destroyed. This tissue is thought to carry the abnormal signals. HOME CARE INSTRUCTIONS   Take medications as directed by your caregiver.  Only take medications that your caregiver approves. Some medications can make atrial fibrillation worse or recur.  If blood thinners were prescribed by your caregiver, take them exactly as directed. Too much can cause bleeding. Too little and you will not have the needed protection against stroke and other problems.  Perform blood tests at home if directed by  your caregiver.  Perform blood tests exactly as directed.   Quit smoking if you smoke.   Do not drink alcohol.   Do not drink caffeinated beverages such as coffee, soda, and some teas. You may drink decaffeinated coffee, soda, or tea.   Maintain a healthy weight. Do not use diet pills unless your caregiver approves. They may make heart problems worse.   Follow diet instructions as directed by your caregiver.   Exercise regularly as directed by your caregiver.   Keep all follow-up appointments. PREVENTION  The following substances can cause atrial fibrillation to recur:   Caffeinated beverages.   Alcohol.   Certain medications, especially those used for breathing problems.   Certain herbs and herbal  medications, such as those containing ephedra or ginseng.  Illegal drugs such as cocaine and amphetamines. Sometimes medications are given to prevent atrial fibrillation from recurring. Proper treatment of any underlying condition is also important in helping prevent recurrence.  SEEK MEDICAL CARE IF:  You notice a change in the rate, rhythm, or strength of your heartbeat.   You suddenly begin urinating more frequently.   You tire more easily when exerting yourself or exercising.  SEEK IMMEDIATE MEDICAL CARE IF:   You develop chest pain, abdominal pain, sweating, or weakness.  You feel sick to your stomach (nauseous).  You develop shortness of breath.  You suddenly develop swollen feet and ankles.  You feel dizzy.  You face or limbs feel numb or weak.  There is a change in your vision or speech. MAKE SURE YOU:   Understand these instructions.  Will watch your condition.  Will get help right away if you are not doing well or get worse. Document Released: 09/18/2005 Document Revised: 01/13/2013 Document Reviewed: 10/29/2012 [facility] Patient Information 2014 [place].

## 2013-11-15 NOTE — ED Notes (Signed)
MD at bedside. 

## 2013-11-15 NOTE — ED Notes (Signed)
Pt. On cardiac monitor. 

## 2013-11-15 NOTE — ED Notes (Signed)
Pt reports left-sided chest pain and palpitations that began yesterday evening - pt admits to hx of a-fib - pt states he saw his cardiologist this week and had some of his medications changed. Pt denies shortness of breath or n/v.

## 2013-11-15 NOTE — ED Notes (Signed)
Pt declines to take Morphine and Zofran at this time; pt advised to contact RN if he changes his mind

## 2013-11-15 NOTE — Discharge Summary (Signed)
Physician Discharge Summary  Cameron [name] [id number] DOB: 20-Jan-1934 DOA: 11/15/2013  PCP: Simona Huh, MD  Admit date: 11/15/2013 Discharge date: 11/15/2013  Recommendations for Outpatient Follow-up:  1. Per oncall cardio Dr. Maryland Pink patient is ok for discharge and to call Dr. Radford Pax and schedule an appt in next few days to make sure symptoms are stable. Since no chest pain and this time and rate is controlled pt can resumed home dose of metoprolol. 2. Cardiac enzyme within normal limit as discussed with the patient.  Discharge Diagnoses:  Principal Problem:   Chest pain Active Problems:   PAF (paroxysmal atrial fibrillation)   Hypertension   Chronic anticoagulation   Chronic diastolic heart failure    Discharge Condition: medically stable for discharge home today   Diet recommendation: as tolerated heart healthy  History of present illness:  80 y.o. male with past medical history of paroxysmal atrial fibrillation on Coumadin recently stopped his Betapace, history of multiple cardioversion, hypertension, peripheral neuropathy, B12 deficiency. Pt presented with chest pressure located in epigastric and precordial region and it lasted for one hour. Onset at rest. Resolved on its own. This was associated with cold hands but no shortness of breath, dizziness, nausea, diaphoresis, vomiting, abdominal pain, back pain.  Hospital Course:  Principal Problem:   Chest pain - resolved at this time - CE are WNL - per cardio pt can follow up with Dr. Radford Pax per sch appt Active Problems:   PAF (paroxysmal atrial fibrillation) - on metoprolol and xarelto   Hypertension - continue metoprolol and lasix   Chronic diastolic heart failure - continue lasix   Signed:  Leisa Lenz, MD  [facility] 11/15/2013, 12:04 PM  Pager #: [phone]   Discharge Exam: Filed Vitals:   11/15/13 1030  BP: 156/87  Pulse: 88  Temp:   Resp:    Filed Vitals:   11/15/13 0430  11/15/13 0445 11/15/13 0521 11/15/13 1030  BP: 157/123 161/83 160/96 156/87  Pulse:   89 88  Temp:   97.5 F (36.4 C)   TempSrc:   Oral   Resp: 24 17 18    Height:   5\' 10"  (1.778 m)   Weight:   92.761 kg (204 lb 8 oz)   SpO2:        General: Pt is alert, follows commands appropriately, not in acute distress Cardiovascular: Regular rate and rhythm, S1/S2 +, no murmurs, no rubs, no gallops Respiratory: Clear to auscultation bilaterally, no wheezing, no crackles, no rhonchi Abdominal: Soft, non tender, non distended, bowel sounds +, no guarding Extremities: no edema, no cyanosis, pulses palpable bilaterally DP and PT Neuro: Grossly nonfocal  Discharge Instructions  Discharge Orders   Future Appointments Provider Department Dept Phone   11/19/2013 9:00 AM [facility] [place] [phone]   Patient should bring all current BP medications.   02/16/2014 8:00 AM Sueanne Margarita, MD [place] [phone]   Future Orders Complete By Expires   Call MD for:  difficulty breathing, headache or visual disturbances  As directed    Call MD for:  persistant dizziness or light-headedness  As directed    Call MD for:  persistant nausea and vomiting  As directed    Call MD for:  severe uncontrolled pain  As directed    Diet - low sodium heart healthy  As directed    Discharge instructions  As directed    Comments:     1. Per oncall cardio Dr. Maryland Pink patient  is ok for discharge and to call Dr. Radford Pax and schedule an appt in next few days to make sure symptoms are stable. Since no chest pain and this time and rate is controlled pt can resumed home dose of metoprolol. 2. Cardiac enzyme within normal limit as discussed with the patient.   Increase activity slowly  As directed        Medication List         bimatoprost 0.01 % Soln  Commonly known as:  LUMIGAN  Place 1 drop into both eyes at bedtime.     cyanocobalamin 1000 MCG/ML injection   Commonly known as:  (VITAMIN B-12)  Inject 1,000 mcg into the muscle every 30 (thirty) days.     doxazosin 4 MG tablet  Commonly known as:  CARDURA  Take 4 mg by mouth daily. for BP     fish oil-omega-3 fatty acids 1000 MG capsule  Take 1 g by mouth 2 (two) times daily.     folic acid 1 MG tablet  Commonly known as:  FOLVITE  Take 1 mg by mouth daily.     furosemide 40 MG tablet  Commonly known as:  LASIX  TAKE 1 TABLET BY MOUTH EVERY DAY AND EXTRA AS DIRECTED     gabapentin 300 MG capsule  Commonly known as:  NEURONTIN  Take 300 mg by mouth 3 (three) times daily. 2-3 times daily     meloxicam 15 MG tablet  Commonly known as:  MOBIC  Take 7.5 mg by mouth daily as needed for pain.     metoprolol succinate 25 MG 24 hr tablet  Commonly known as:  TOPROL XL  Take 1 tablet (25 mg total) by mouth daily.     Rivaroxaban 20 MG Tabs tablet  Commonly known as:  XARELTO  Take 1 tablet (20 mg total) by mouth daily.     traMADol 50 MG tablet  Commonly known as:  ULTRAM  Take 50 mg by mouth every 4 (four) hours as needed for moderate pain or severe pain. Maximum dose= 8 tablets per day           Follow-up Information   Follow up with Simona Huh, MD. Schedule an appointment as soon as possible for a visit in 1 week.   Specialty:  Family Medicine   Contact information:   [address], [place] [address] [phone]       Follow up with Sueanne Margarita, MD. Schedule an appointment as soon as possible for a visit in 1 week.   Specialty:  Cardiology   Contact information:   [address] [place] [address] [phone]        The results of significant diagnostics from this hospitalization (including imaging, microbiology, ancillary and laboratory) are listed below for reference.    Significant Diagnostic Studies: Dg Chest Portable 1 View  11/15/2013   CLINICAL DATA:  Left-sided chest pain and palpitations.  EXAM: PORTABLE CHEST  - 1 VIEW  COMPARISON:  08/18/2013  FINDINGS: Enlargement of the cardiac silhouette is unchanged. There is improved aeration of the right lower lung. There is no evidence of new airspace consolidation, overt edema, large pleural effusion, or pneumothorax. Mild bilateral acromioclavicular joint osteoarthrosis is noted. Multilevel osteophytosis is present in the thoracic spine.  IMPRESSION: Improved aeration of the right lower lung. No definite evidence of acute airspace disease.   Electronically Signed   By: Logan Bores   On: 11/15/2013 03:07  Microbiology: No results found for this or any previous visit (from the past 240 hour(s)).   Labs: Basic Metabolic Panel:  Recent Labs Lab 11/15/13 0245  NA 137  K 3.8  CL 97  CO2 24  GLUCOSE 195*  BUN 16  CREATININE 0.89  CALCIUM 9.6   Liver Function Tests: No results found for this basename: AST, ALT, ALKPHOS, BILITOT, PROT, ALBUMIN,  in the last 168 hours No results found for this basename: LIPASE, AMYLASE,  in the last 168 hours No results found for this basename: AMMONIA,  in the last 168 hours CBC:  Recent Labs Lab 11/15/13 0245  WBC 6.4  HGB 13.8  HCT 41.7  MCV 85.3  PLT 144*   Cardiac Enzymes:  Recent Labs Lab 11/15/13 0747  TROPONINI <0.30   BNP: BNP (last 3 results) No results found for this basename: PROBNP,  in the last 8760 hours CBG: No results found for this basename: GLUCAP,  in the last 168 hours  Time coordinating discharge: Over 30 minutes

## 2013-11-15 NOTE — H&P (Signed)
[facility] History and Physical  Patient: Cameron [name]  [id number]  DOB: 1934-03-25  DOS: the patient was seen and examined on 11/15/2013 PCP: Simona Huh, MD  Chief Complaint: Chest pain  HPI: Cameron [name] is a 80 y.o. male with Past medical history of paroxysmal atrial fibrillation on Coumadin recently stopped his Betapace, history of multiple cardioversion, hypertension, peripheral neuropathy, B12 deficiency. The patient is coming from home. The patient presented with complaints of chest pain. The pain was felt as pressure located in epigastric and precordial region it lasted for one hour. Onset at rest. Resolved on its own. This was associated with cold hands but no shortness of breath, dizziness, nausea, diaphoresis, vomiting, abdominal pain, back pain. He denies any similar pain prior. He has history of atrial fibrillation and has undergone stress test a few years ago. He also mentions he has coronary angiography although I do not find any records. No prior PCI or CABG. He has chronic diastolic dysfunction and has taken his Lasix today. Denies any complaint of acid reflux, heartburn, abdominal pain, diarrhea, burning urination, active bleeding. No fever no cough no chills no recent travel.  Review of Systems: as mentioned in the history of present illness.  A Comprehensive review of the other systems is negative.  Past Medical History  Diagnosis Date  . Bradycardia     asymptomatic  . Rosacea   . Gout     in 70's  . Vitamin B12 deficiency   . Anemia   . Arthritis   . Neuromuscular disorder   . Peripheral neuropathy     both feet, can feel pain  . Anxiety     at times  . Headache(784.0)     hx of, none recent  . Hypertension   . Atrial fibrillation     s/p DCCV  . Atrial flutter   . Bradycardia   . Chronic anticoagulation    Past Surgical History  Procedure Laterality Date  . Hemicolectomy  03/1996  . Cardioversion      x5  . Appendectomy     1997  . Tumor removal Right 1961    back of leg  . Cataract extraction Bilateral   . Inguinal hernia repair Left 08/21/2013    Procedure:  LEFT INGUINAL HERNIA REPAIR WITH MESH;  Surgeon: Earnstine Regal, MD;  Location: [facility];  Service: General;  Laterality: Left;  . Insertion of mesh Left 08/21/2013    Procedure: INSERTION OF MESH;  Surgeon: Earnstine Regal, MD;  Location: [facility];  Service: General;  Laterality: Left;  . Cardioversion N/A 10/24/2013    Procedure: CARDIOVERSION;  Surgeon: Sueanne Margarita, MD;  Location: [facility];  Service: Cardiovascular;  Laterality: N/A;   Social History:  reports that he quit smoking about 39 years ago. His smoking use included Cigarettes. He smoked 2.00 packs per day. He has never used smokeless tobacco. He reports that he drinks alcohol. He reports that he does not use illicit drugs. Independent for most of his  ADL.  Allergies  Allergen Reactions  . Ace Inhibitors     Lips inflated to about "20 pound"  . Clonidine Derivatives Rash    Family History  Problem Relation Age of Onset  . Stroke Mother 66  . Dementia Sister   . Cancer Father     lung    Prior to Admission medications   Medication Sig Start Date End Date Taking? Authorizing Provider  bimatoprost (LUMIGAN) 0.01 % SOLN Place 1  drop into both eyes at bedtime.    Yes Historical Provider, MD  cyanocobalamin (,VITAMIN B-12,) 1000 MCG/ML injection Inject 1,000 mcg into the muscle every 30 (thirty) days.    Yes Historical Provider, MD  doxazosin (CARDURA) 4 MG tablet Take 4 mg by mouth daily. for BP 08/02/13  Yes Historical Provider, MD  fish oil-omega-3 fatty acids 1000 MG capsule Take 1 g by mouth 2 (two) times daily.    Yes Historical Provider, MD  folic acid (FOLVITE) 1 MG tablet Take 1 mg by mouth daily.    Yes Historical Provider, MD  furosemide (LASIX) 40 MG tablet TAKE 1 TABLET BY MOUTH EVERY DAY AND EXTRA AS DIRECTED   Yes Sueanne Margarita, MD  gabapentin (NEURONTIN) 300 MG capsule  Take 300 mg by mouth 3 (three) times daily. 2-3 times daily   Yes Historical Provider, MD  meloxicam (MOBIC) 15 MG tablet Take 7.5 mg by mouth daily as needed for pain.    Yes Historical Provider, MD  metoprolol succinate (TOPROL XL) 25 MG 24 hr tablet Take 1 tablet (25 mg total) by mouth daily. 11/11/13  Yes Sueanne Margarita, MD  Rivaroxaban (XARELTO) 20 MG TABS tablet Take 1 tablet (20 mg total) by mouth daily. 11/11/13  Yes Sueanne Margarita, MD  traMADol (ULTRAM) 50 MG tablet Take 50 mg by mouth every 4 (four) hours as needed for moderate pain or severe pain. Maximum dose= 8 tablets per day   Yes Historical Provider, MD    Physical Exam: Filed Vitals:   11/15/13 0400 11/15/13 0430 11/15/13 0445 11/15/13 0521  BP: 162/78 157/123 161/83 160/96  Pulse:    89  Temp:    97.5 F (36.4 C)  TempSrc:    Oral  Resp: 28 24 17 18   Height:    5\' 10"  (1.778 m)  Weight:    92.761 kg (204 lb 8 oz)  SpO2:        General: Alert, Awake and Oriented to Time, Place and Person. Appear in moderate distress Eyes: PERRL ENT: Oral Mucosa clear moist Neck: no JVD Cardiovascular: S1 and S2 Present, no Murmur, Peripheral Pulses Present Respiratory: Bilateral Air entry equal and Decreased, Clear to Auscultation,  no Crackles,no wheezes Abdomen: Bowel Sound Present, Soft and Non tender Skin: no Rash Extremities: Bilateral Pedal edema, no calf tenderness Neurologic: Grossly Unremarkable. Labs on Admission:  CBC:  Recent Labs Lab 11/15/13 0245  WBC 6.4  HGB 13.8  HCT 41.7  MCV 85.3  PLT 144*    CMP     Component Value Date/Time   NA 137 11/15/2013 0245   K 3.8 11/15/2013 0245   CL 97 11/15/2013 0245   CO2 24 11/15/2013 0245   GLUCOSE 195* 11/15/2013 0245   BUN 16 11/15/2013 0245   CREATININE 0.89 11/15/2013 0245   CALCIUM 9.6 11/15/2013 0245   GFRNONAA 80* 11/15/2013 0245   GFRAA >90 11/15/2013 0245    No results found for this basename: LIPASE, AMYLASE,  in the last 168 hours No results found for  this basename: AMMONIA,  in the last 168 hours  No results found for this basename: CKTOTAL, CKMB, CKMBINDEX, TROPONINI,  in the last 168 hours BNP (last 3 results) No results found for this basename: PROBNP,  in the last 8760 hours  Radiological Exams on Admission: Dg Chest Portable 1 View  11/15/2013   CLINICAL DATA:  Left-sided chest pain and palpitations.  EXAM: PORTABLE CHEST - 1 VIEW  COMPARISON:  08/18/2013  FINDINGS: Enlargement of the cardiac silhouette is unchanged. There is improved aeration of the right lower lung. There is no evidence of new airspace consolidation, overt edema, large pleural effusion, or pneumothorax. Mild bilateral acromioclavicular joint osteoarthrosis is noted. Multilevel osteophytosis is present in the thoracic spine.  IMPRESSION: Improved aeration of the right lower lung. No definite evidence of acute airspace disease.   Electronically Signed   By: Logan Bores   On: 11/15/2013 03:07    EKG: Independently reviewed. atrial fibrillation, rate 102.  Assessment/Plan Principal Problem:   Chest pain Active Problems:   PAF (paroxysmal atrial fibrillation)   Hypertension   Chronic anticoagulation   Chronic diastolic heart failure   1. Chest pain The patient is presenting with chest pain which was felt as pressure and was not reproducible lasted for one hour. Patient presented within one hour of onset of chest pain. His initial EKG shows atrial fibrillation with rate 102 without any signs of ischemia. Initial troponin is negative. With his history he was recommended to be admitted for observation, serial troponin, and possible stress test he'll be kept n.p.o. I would continue his Xarelto. I would also place him on a baby dose of aspirin 81 mg. Check lipid profile and TSH   2. atrial fibrillation At present rate controlled. The patient has multiple failed cardioversion attempts as well as his Betapace was also not successful in keeping him and rhythm. He was  recently placed on Toprol-XL 25 mg. His heart rate is mildly elevated and thus along with his complaints of chest pressure I will increase his beta blocker from Toprol-XL 25 mg daily to Lopressor 50 mg twice a day. Continue to monitor on telemetry.  3. chronic anticoagulation Continues to Xarelto 4. Chronic diastolic dysfunction Patient has chronic leg swelling and has been taking Lasix daily. A repeat he didn't save he does not appear volume overloaded at present.  DVT Prophylaxis: Xarelto Nutrition: N.p.o. except meds  Code Status: Full  Disposition: Admitted to observation in telemetry unit.  Author: Berle Mull, MD [facility] Pager: [phone] 11/15/2013, 5:33 AM    If 7PM-7AM, please contact night-coverage [email or web address] Password [email or web address]

## 2013-11-17 ENCOUNTER — Telehealth: Payer: Self-pay | Admitting: Cardiology

## 2013-11-17 NOTE — Telephone Encounter (Signed)
Please let patient know that he has moderate OSA and set up for CPAP titration in the lab

## 2013-11-19 NOTE — Telephone Encounter (Signed)
Pt is aware. Form filled out and gave to [place].

## 2013-11-20 ENCOUNTER — Ambulatory Visit (INDEPENDENT_AMBULATORY_CARE_PROVIDER_SITE_OTHER): Payer: Medicare Other

## 2013-11-20 VITALS — BP 150/80 | HR 81 | Ht 70.0 in | Wt 207.2 lb

## 2013-11-20 DIAGNOSIS — D510 Vitamin B12 deficiency anemia due to intrinsic factor deficiency: Secondary | ICD-10-CM | POA: Diagnosis not present

## 2013-11-20 DIAGNOSIS — I4891 Unspecified atrial fibrillation: Secondary | ICD-10-CM

## 2013-11-20 NOTE — Progress Notes (Signed)
Patient came to office for a EKG.EKG was done, revealed atrial fib rate 81 beats/min.EKG was shown to DOD Dr.Klein.Advised to continue same medications.

## 2013-12-03 ENCOUNTER — Encounter: Payer: Self-pay | Admitting: Cardiology

## 2013-12-03 DIAGNOSIS — G4733 Obstructive sleep apnea (adult) (pediatric): Secondary | ICD-10-CM | POA: Diagnosis not present

## 2013-12-08 ENCOUNTER — Encounter: Payer: Self-pay | Admitting: Cardiology

## 2013-12-16 ENCOUNTER — Telehealth: Payer: Self-pay | Admitting: Cardiology

## 2013-12-16 NOTE — Telephone Encounter (Signed)
Please let patient know that he had successful CPAP titration and set up OV with me in 10 weeks

## 2013-12-17 NOTE — Telephone Encounter (Signed)
Pt is aware and set up for 10 week sleep f.u made for pt.

## 2013-12-18 DIAGNOSIS — D510 Vitamin B12 deficiency anemia due to intrinsic factor deficiency: Secondary | ICD-10-CM | POA: Diagnosis not present

## 2013-12-24 ENCOUNTER — Telehealth: Payer: Self-pay | Admitting: General Surgery

## 2013-12-24 NOTE — Telephone Encounter (Signed)
Sent to betsy S.

## 2013-12-24 NOTE — Telephone Encounter (Signed)
See OV note from 11/11/2013

## 2013-12-24 NOTE — Telephone Encounter (Signed)
TO Dr Radford Pax to make aware. We referred pt when he had cardioversion and you observed apnea

## 2013-12-24 NOTE — Telephone Encounter (Signed)
Message copied by Lily Kocher on Wed Dec 24, 2013 12:24 PM ------      Message from: Gilda Crease      Created: Tue Dec 23, 2013  5:44 PM      Regarding: RE: face to face note       Patient has MCR.  This requires an office visit that states patient is going to have a sleep study.                    ----- Message -----         From: Lily Kocher, CMA         Sent: 12/23/2013  10:19 AM           To: Gilda Crease      Subject: RE: face to face note                                    Telephone encounter 10/24/13. He had observed apnea suring cardioversion.       ----- Message -----         From: Gilda Crease         Sent: 12/22/2013   5:38 PM           To: Lily Kocher, CMA      Subject: face to face note                                        I am having trouble locating the office visit note that referred this patient to have a sleep study.  I have requested what was sent to [facility] but Amy has not gotten back with me.            I see a telephone note in Epic about it, but can you help me get the office visit note that states that patient will be referred for a sleep study?            Thanks      [employer]             ------

## 2014-01-06 ENCOUNTER — Telehealth (INDEPENDENT_AMBULATORY_CARE_PROVIDER_SITE_OTHER): Payer: Self-pay | Admitting: Surgery

## 2014-01-06 ENCOUNTER — Ambulatory Visit (INDEPENDENT_AMBULATORY_CARE_PROVIDER_SITE_OTHER): Payer: Medicare Other | Admitting: Surgery

## 2014-01-06 ENCOUNTER — Encounter (INDEPENDENT_AMBULATORY_CARE_PROVIDER_SITE_OTHER): Payer: Self-pay | Admitting: Surgery

## 2014-01-06 VITALS — BP 128/76 | HR 90 | Temp 97.0°F | Ht 70.0 in | Wt 211.0 lb

## 2014-01-06 DIAGNOSIS — K4090 Unilateral inguinal hernia, without obstruction or gangrene, not specified as recurrent: Secondary | ICD-10-CM | POA: Diagnosis not present

## 2014-01-06 NOTE — Progress Notes (Signed)
General Surgery - [facility]  Chief Complaint  Patient presents with  . Follow-up    Evaluate for RIH repair - primary care is Dr. Robert Ehinger; cardiology is Dr. Steve Klein    HISTORY: Patient is a 80-year-old male well known to my practice. He underwent left inguinal hernia repair in November 2014. This was the larger and more symptomatic of his bilateral hernias. He now returns to consider right inguinal hernia repair.  Patient is noted at the right inguinal hernia has become larger over the past several months. He denies any signs or symptoms of obstruction. He has minor intermittent discomfort. He has minor intermittent discomfort in the left groin as well.  Patient remains on Xarelto for treatment of atrial fibrillation. He is followed by cardiology.  PERTINENT REVIEW OF SYSTEMS: Denies signs or symptoms of obstruction. Intermittent discomfort. Gradual enlargement.  EXAM: HEENT: normocephalic; pupils equal and reactive; sclerae clear; dentition good; mucous membranes moist NECK:  symmetric on extension; no palpable anterior or posterior cervical lymphadenopathy; no supraclavicular masses; no tenderness CHEST: clear to auscultation bilaterally without rales, rhonchi, or wheezes CARDIAC: Controlled rate without significant murmur; peripheral pulses are full EXT:  non-tender without edema; no deformity GU:  Left groin with well-healed surgical incision; palpation with cough and Valsalva shows no sign of recurrence on the left; palpation in the right groin shows a moderate to large inguinal hernia, probably direct; this is reducible with some manipulation and is mildly tender  IMPRESSION: #1 right inguinal hernia, moderately large, mildly symptomatic #2 status post left inguinal hernia repair with mesh, no evidence of recurrence #3 atrial fibrillation on anticoagulation  PLAN: I discussed with the patient the indications for repair of his right inguinal  hernia. We have discussed this on previous occasions. He would like to proceed with surgery in the near future. We will arrange for this as an outpatient surgery to be performed in the hospital due to his cardiac issues. We will stop his anti-coagulation 2 days prior to his procedure. We discussed the risk and benefits. We discussed the use of prosthetic mesh. We discussed restrictions on his activities following the procedure. Patient understands and wishes to proceed.  The risks and benefits of the procedure have been discussed at length with the patient.  The patient understands the proposed procedure, potential alternative treatments, and the course of recovery to be expected.  All of the patient's questions have been answered at this time.  The patient wishes to proceed with surgery.  Kinda Pottle M. Mouhamad Teed, MD, FACS [facility] Office: [phone]  Visit Diagnoses: 1. Right inguinal hernia, direct       

## 2014-01-06 NOTE — Patient Instructions (Signed)
[facility]  HERNIA REPAIR POST OP INSTRUCTIONS  Always review your discharge instruction sheet given to you by the facility where your surgery was performed.  1. A  prescription for pain medication may be given to you upon discharge.  Take your pain medication as prescribed.  If narcotic pain medicine is not needed, then you may take acetaminophen (Tylenol) or ibuprofen (Advil) as needed.  2. Take your usually prescribed medications unless otherwise directed.  3. If you need a refill on your pain medication, please contact your pharmacy.  They will contact our office to request authorization. Prescriptions will not be filled after 5 pm daily or on weekends.  4. You should follow a light diet the first 24 hours after arrival home, such as soup and crackers or toast.  Be sure to include plenty of fluids daily.  Resume your normal diet the day after surgery.  5. Most patients will experience some swelling and bruising around the surgical site.  Ice packs and reclining will help.  Swelling and bruising can take several days to resolve.   6. It is common to experience some constipation if taking pain medication after surgery.  Increasing fluid intake and taking a stool softener (such as Colace) will usually help or prevent this problem from occurring.  A mild laxative (Milk of Magnesia or Miralax) should be taken according to package directions if there are no bowel movements after 48 hours.  7. Unless discharge instructions indicate otherwise, you may remove your bandages 24-48 hours after surgery, and you may shower at that time.  You may have steri-strips (small skin tapes) in place directly over the incision.  These strips should be left on the skin for 7-10 days.  If your surgeon used skin glue on the incision, you may shower in 24 hours.  The glue will flake off over the next 2-3 weeks.  Any sutures or staples will be removed at the office during your follow-up  visit.  8. ACTIVITIES:  You may resume regular (light) daily activities beginning the next day-such as daily self-care, walking, climbing stairs-gradually increasing activities as tolerated.  You may have sexual intercourse when it is comfortable.  Refrain from any heavy lifting or straining until approved by your doctor.  You may drive when you are no longer taking prescription pain medication, you can comfortably wear a seatbelt, and you can safely maneuver your car and apply brakes.  9. You should see your doctor in the office for a follow-up appointment approximately 2-3 weeks after your surgery.  Make sure that you call for this appointment within a day or two after you arrive home to insure a convenient appointment time. 10.   WHEN TO CALL YOUR DOCTOR: 1. Fever greater than 101.0 2. Inability to urinate 3. Persistent nausea and/or vomiting 4. Extreme swelling or bruising 5. Continued bleeding from incision 6. Increased pain, redness, or drainage from the incision  The clinic staff is available to answer your questions during regular business hours.  Please don't hesitate to call and ask to speak to one of the nurses for clinical concerns.  If you have a medical emergency, go to the nearest emergency room or call 911.  A surgeon from [facility] is always on call for the hospital.   [facility] [address], [address], [place], [place]  [address]  [phone] ? [phone] ? FAX [phone]  [email or web address]   

## 2014-01-06 NOTE — Telephone Encounter (Signed)
Patient met with surgery scheduling and will call back to schedule once he finds out from his son what days will work best.

## 2014-01-15 ENCOUNTER — Encounter (HOSPITAL_COMMUNITY): Payer: Self-pay | Admitting: Pharmacy Technician

## 2014-01-19 DIAGNOSIS — D510 Vitamin B12 deficiency anemia due to intrinsic factor deficiency: Secondary | ICD-10-CM | POA: Diagnosis not present

## 2014-01-21 NOTE — Patient Instructions (Addendum)
[facility]  01/21/2014   Your procedure is scheduled on: Thursday April 30th, 2015  Report to [facility] at 530  AM.  Call this number if you have problems the morning of surgery [phone]   Remember: BRING CPAP MASK AND TUBING   Do not eat food or drink liquids :After Midnight.     Take these medicines the morning of surgery with A SIP OF WATER: gabapentin, metoprolol                            You may not have any metal on your body including hair pins and piercings  Do not wear jewelry, make-up, lotions, powders, or deodorant.   Men may shave face and neck.  Do not bring valuables to the hospital. [place].  Contacts, dentures or bridgework may not be worn into surgery.  .   Patients discharged the day of surgery will not be allowed to drive home.  Name and phone number of your driver: Jenny Reichmann (son) [phone]    [facility] - Preparing for Surgery Before surgery, you can play an important role.  Because skin is not sterile, your skin needs to be as free of germs as possible.  You can reduce the number of germs on your skin by washing with CHG (chlorahexidine gluconate) soap before surgery.  CHG is an antiseptic cleaner which kills germs and bonds with the skin to continue killing germs even after washing. Please DO NOT use if you have an allergy to CHG or antibacterial soaps.  If your skin becomes reddened/irritated stop using the CHG and inform your nurse when you arrive at Short Stay. Do not shave (including legs and underarms) for at least 48 hours prior to the first CHG shower.  You may shave your face. Please follow these instructions carefully:  1.  Shower with CHG Soap the night before surgery and the  morning of Surgery.  2.  If you choose to wash your hair, wash your hair first as usual with your  normal  shampoo.  3.  After you shampoo, rinse your hair and body thoroughly to remove the  shampoo.                              4.  Use CHG as you would any other liquid soap.  You can apply chg directly  to the skin and wash                       Gently with a scrungie or clean washcloth.  5.  Apply the CHG Soap to your body ONLY FROM THE NECK DOWN.   Do not use on open                           Wound or open sores. Avoid contact with eyes, ears mouth and genitals (private parts).                        Genitals (private parts) with your normal soap.             6.  Wash thoroughly, paying special attention to the area where your surgery  will be performed.  7.  Thoroughly rinse your body with warm water from the  neck down.  8.  DO NOT shower/wash with your normal soap after using and rinsing off  the CHG Soap.                9.  Pat yourself dry with a clean towel.            10.  Wear clean pajamas.            11.  Place clean sheets on your bed the night of your first shower and do not  sleep with pets. Day of Surgery : Do not apply any lotions/deodorants the morning of surgery.  Please wear clean clothes to the hospital/surgery center.  FAILURE TO FOLLOW THESE INSTRUCTIONS MAY RESULT IN THE CANCELLATION OF YOUR SURGERY PATIENT SIGNATURE_________________________________  NURSE SIGNATURE__________________________________

## 2014-01-21 NOTE — Progress Notes (Signed)
[facility] 11-11-13 EPIC CHEST 1 VIEW XRAY 11-15-13 EPIC SLEEP STUDY 11-19-13 EPIC EKG 11-20-13 EPIC

## 2014-01-22 ENCOUNTER — Encounter (HOSPITAL_COMMUNITY)
Admission: RE | Admit: 2014-01-22 | Discharge: 2014-01-22 | Disposition: A | Discharge status: Home / Self Care | Payer: Medicare Other | Source: Ambulatory Visit | Attending: Surgery | Admitting: Surgery

## 2014-01-22 ENCOUNTER — Encounter (HOSPITAL_COMMUNITY): Payer: Self-pay

## 2014-01-22 DIAGNOSIS — Z01812 Encounter for preprocedural laboratory examination: Secondary | ICD-10-CM | POA: Diagnosis not present

## 2014-01-22 DIAGNOSIS — K4090 Unilateral inguinal hernia, without obstruction or gangrene, not specified as recurrent: Secondary | ICD-10-CM | POA: Insufficient documentation

## 2014-01-22 HISTORY — DX: Low back pain, unspecified: M54.50

## 2014-01-22 HISTORY — DX: Edema, unspecified: R60.9

## 2014-01-22 HISTORY — DX: Low back pain: M54.5

## 2014-01-22 LAB — BASIC METABOLIC PANEL
BUN: 13 mg/dL (ref 6–23)
CHLORIDE: 98 meq/L (ref 96–112)
CO2: 28 meq/L (ref 19–32)
Calcium: 9.5 mg/dL (ref 8.4–10.5)
Creatinine, Ser: 1.02 mg/dL (ref 0.50–1.35)
GFR calc non Af Amer: 69 mL/min — ABNORMAL LOW (ref >90)
GFR, EST AFRICAN AMERICAN: 80 mL/min — AB (ref >90)
Glucose, Bld: 149 mg/dL — ABNORMAL HIGH (ref 70–99)
POTASSIUM: 4 meq/L (ref 3.7–5.3)
Sodium: 140 mEq/L (ref 137–147)

## 2014-01-22 LAB — CBC
HEMATOCRIT: 43.4 % (ref 39.0–52.0)
HEMOGLOBIN: 14.4 g/dL (ref 13.0–17.0)
MCH: 27.6 pg (ref 26.0–34.0)
MCHC: 33.2 g/dL (ref 30.0–36.0)
MCV: 83.3 fL (ref 78.0–100.0)
Platelets: 149 10*3/uL — ABNORMAL LOW (ref 150–400)
RBC: 5.21 MIL/uL (ref 4.22–5.81)
RDW: 15.1 % (ref 11.5–15.5)
WBC: 7.3 10*3/uL (ref 4.0–10.5)

## 2014-01-22 NOTE — Progress Notes (Signed)
Quick Note:  These results are acceptable for scheduled surgery.  Severina Sykora M. Sherman Donaldson, MD, FACS [facility] Office: [phone]   ______ 

## 2014-01-22 NOTE — Progress Notes (Signed)
Quick Note:  These results are acceptable for scheduled surgery.  Curby Carswell M. Nusaiba Guallpa, MD, FACS [facility] Office: [phone]   ______ 

## 2014-01-26 ENCOUNTER — Telehealth (INDEPENDENT_AMBULATORY_CARE_PROVIDER_SITE_OTHER): Payer: Self-pay

## 2014-01-26 ENCOUNTER — Other Ambulatory Visit (INDEPENDENT_AMBULATORY_CARE_PROVIDER_SITE_OTHER): Payer: Self-pay

## 2014-01-26 DIAGNOSIS — G8918 Other acute postprocedural pain: Secondary | ICD-10-CM

## 2014-01-26 MED ORDER — HYDROCODONE-ACETAMINOPHEN 5-325 MG PO TABS: 1.0000 | ORAL_TABLET | Freq: Four times a day (QID) | ORAL | Status: DC | PRN

## 2014-01-26 NOTE — Telephone Encounter (Signed)
Called and left patient a message to make aware his prescription for Norco is at the front desk for pick up.

## 2014-01-27 ENCOUNTER — Other Ambulatory Visit: Payer: Self-pay | Admitting: Cardiology

## 2014-01-29 ENCOUNTER — Ambulatory Visit (HOSPITAL_COMMUNITY)
Admission: RE | Admit: 2014-01-29 | Discharge: 2014-01-29 | Disposition: A | Discharge status: Home / Self Care | Payer: Medicare Other | Source: Ambulatory Visit | Attending: Surgery | Admitting: Surgery

## 2014-01-29 ENCOUNTER — Encounter (HOSPITAL_COMMUNITY): Payer: Medicare Other | Admitting: Anesthesiology

## 2014-01-29 ENCOUNTER — Encounter (HOSPITAL_COMMUNITY): Payer: Self-pay | Admitting: *Deleted

## 2014-01-29 ENCOUNTER — Encounter (HOSPITAL_COMMUNITY)
Admission: RE | Disposition: A | Discharge status: Home / Self Care | Payer: Self-pay | Source: Ambulatory Visit | Attending: Surgery

## 2014-01-29 ENCOUNTER — Ambulatory Visit (HOSPITAL_COMMUNITY): Payer: Medicare Other | Admitting: Anesthesiology

## 2014-01-29 DIAGNOSIS — I4891 Unspecified atrial fibrillation: Secondary | ICD-10-CM | POA: Insufficient documentation

## 2014-01-29 DIAGNOSIS — K4090 Unilateral inguinal hernia, without obstruction or gangrene, not specified as recurrent: Secondary | ICD-10-CM | POA: Diagnosis not present

## 2014-01-29 DIAGNOSIS — D649 Anemia, unspecified: Secondary | ICD-10-CM | POA: Diagnosis not present

## 2014-01-29 DIAGNOSIS — Z7901 Long term (current) use of anticoagulants: Secondary | ICD-10-CM | POA: Insufficient documentation

## 2014-01-29 DIAGNOSIS — I10 Essential (primary) hypertension: Secondary | ICD-10-CM | POA: Diagnosis not present

## 2014-01-29 HISTORY — PX: INGUINAL HERNIA REPAIR: SHX194

## 2014-01-29 SURGERY — REPAIR, HERNIA, INGUINAL, ADULT
Anesthesia: General | Site: Groin | Laterality: Right

## 2014-01-29 MED ORDER — DEXAMETHASONE SODIUM PHOSPHATE 4 MG/ML IJ SOLN
INTRAMUSCULAR | Status: DC | PRN
  Administered 2014-01-29: 10 mg via INTRAVENOUS

## 2014-01-29 MED ORDER — LIDOCAINE HCL (CARDIAC) 20 MG/ML IV SOLN
INTRAVENOUS | Status: DC | PRN
  Administered 2014-01-29: 30 mg via INTRAVENOUS

## 2014-01-29 MED ORDER — LACTATED RINGERS IV SOLN
INTRAVENOUS | Status: DC | PRN
  Administered 2014-01-29 (×2): via INTRAVENOUS

## 2014-01-29 MED ORDER — SUCCINYLCHOLINE CHLORIDE 20 MG/ML IJ SOLN
INTRAMUSCULAR | Status: DC | PRN
  Administered 2014-01-29: 140 mg via INTRAVENOUS

## 2014-01-29 MED ORDER — PROPOFOL 10 MG/ML IV BOLUS
INTRAVENOUS | Status: AC
  Filled 2014-01-29: qty 20

## 2014-01-29 MED ORDER — ONDANSETRON HCL 4 MG/2ML IJ SOLN
INTRAMUSCULAR | Status: AC
  Filled 2014-01-29: qty 2

## 2014-01-29 MED ORDER — BUPIVACAINE HCL (PF) 0.5 % IJ SOLN
INTRAMUSCULAR | Status: DC | PRN
  Administered 2014-01-29: 20 mL

## 2014-01-29 MED ORDER — CEFAZOLIN SODIUM-DEXTROSE 2-3 GM-% IV SOLR
INTRAVENOUS | Status: AC
  Filled 2014-01-29: qty 50

## 2014-01-29 MED ORDER — LACTATED RINGERS IV SOLN: INTRAVENOUS | Status: DC

## 2014-01-29 MED ORDER — NEOSTIGMINE METHYLSULFATE 1 MG/ML IJ SOLN
INTRAMUSCULAR | Status: DC | PRN
  Administered 2014-01-29: 3 mg via INTRAVENOUS

## 2014-01-29 MED ORDER — SODIUM CHLORIDE 0.9 % IJ SOLN
INTRAMUSCULAR | Status: AC
  Filled 2014-01-29: qty 10

## 2014-01-29 MED ORDER — GLYCOPYRROLATE 0.2 MG/ML IJ SOLN
INTRAMUSCULAR | Status: DC | PRN
  Administered 2014-01-29: 0.4 mg via INTRAVENOUS

## 2014-01-29 MED ORDER — PROPOFOL 10 MG/ML IV BOLUS
INTRAVENOUS | Status: DC | PRN
  Administered 2014-01-29: 150 mg via INTRAVENOUS

## 2014-01-29 MED ORDER — NEOSTIGMINE METHYLSULFATE 10 MG/10ML IV SOLN
INTRAVENOUS | Status: AC
  Filled 2014-01-29: qty 1

## 2014-01-29 MED ORDER — CISATRACURIUM BESYLATE (PF) 10 MG/5ML IV SOLN
INTRAVENOUS | Status: DC | PRN
  Administered 2014-01-29: 10 mg via INTRAVENOUS

## 2014-01-29 MED ORDER — BUPIVACAINE HCL (PF) 0.5 % IJ SOLN
INTRAMUSCULAR | Status: AC
  Filled 2014-01-29: qty 30

## 2014-01-29 MED ORDER — EPHEDRINE SULFATE 50 MG/ML IJ SOLN
INTRAMUSCULAR | Status: DC | PRN
  Administered 2014-01-29: 10 mg via INTRAVENOUS

## 2014-01-29 MED ORDER — FENTANYL CITRATE 0.05 MG/ML IJ SOLN
INTRAMUSCULAR | Status: AC
  Filled 2014-01-29: qty 2

## 2014-01-29 MED ORDER — FENTANYL CITRATE 0.05 MG/ML IJ SOLN
INTRAMUSCULAR | Status: DC | PRN
  Administered 2014-01-29: 25 ug via INTRAVENOUS
  Administered 2014-01-29: 75 ug via INTRAVENOUS

## 2014-01-29 MED ORDER — HYDROMORPHONE HCL PF 1 MG/ML IJ SOLN
INTRAMUSCULAR | Status: DC | PRN
  Administered 2014-01-29 (×3): 0.5 mg via INTRAVENOUS

## 2014-01-29 MED ORDER — CEFAZOLIN SODIUM-DEXTROSE 2-3 GM-% IV SOLR
2.0000 g | INTRAVENOUS | Status: AC
  Administered 2014-01-29: 2 g via INTRAVENOUS

## 2014-01-29 MED ORDER — HYDROMORPHONE HCL PF 2 MG/ML IJ SOLN
INTRAMUSCULAR | Status: AC
  Filled 2014-01-29: qty 1

## 2014-01-29 MED ORDER — HYDROMORPHONE HCL PF 1 MG/ML IJ SOLN: 0.2500 mg | INTRAMUSCULAR | Status: DC | PRN

## 2014-01-29 MED ORDER — CISATRACURIUM BESYLATE 20 MG/10ML IV SOLN
INTRAVENOUS | Status: AC
  Filled 2014-01-29: qty 10

## 2014-01-29 MED ORDER — HYDROCODONE-ACETAMINOPHEN 5-325 MG PO TABS: 1.0000 | ORAL_TABLET | ORAL | Status: DC | PRN

## 2014-01-29 MED ORDER — EPHEDRINE SULFATE 50 MG/ML IJ SOLN
INTRAMUSCULAR | Status: AC
  Filled 2014-01-29: qty 1

## 2014-01-29 MED ORDER — ONDANSETRON HCL 4 MG/2ML IJ SOLN
INTRAMUSCULAR | Status: DC | PRN
  Administered 2014-01-29 (×2): 2 mg via INTRAVENOUS

## 2014-01-29 MED ORDER — PROMETHAZINE HCL 25 MG/ML IJ SOLN: 6.2500 mg | INTRAMUSCULAR | Status: DC | PRN

## 2014-01-29 MED ORDER — LIDOCAINE HCL (CARDIAC) 20 MG/ML IV SOLN
INTRAVENOUS | Status: AC
  Filled 2014-01-29: qty 10

## 2014-01-29 MED ORDER — GLYCOPYRROLATE 0.2 MG/ML IJ SOLN
INTRAMUSCULAR | Status: AC
  Filled 2014-01-29: qty 2

## 2014-01-29 MED ORDER — DEXAMETHASONE SODIUM PHOSPHATE 10 MG/ML IJ SOLN
INTRAMUSCULAR | Status: AC
  Filled 2014-01-29: qty 1

## 2014-01-29 SURGICAL SUPPLY — 38 items
APPLICATOR COTTON TIP 6IN STRL (MISCELLANEOUS) ×3 IMPLANT
BENZOIN TINCTURE PRP APPL 2/3 (GAUZE/BANDAGES/DRESSINGS) ×3 IMPLANT
BLADE HEX COATED 2.75 (ELECTRODE) ×3 IMPLANT
BLADE SURG 15 STRL LF DISP TIS (BLADE) ×1 IMPLANT
BLADE SURG 15 STRL SS (BLADE) ×2
BLADE SURG SZ10 CARB STEEL (BLADE) ×3 IMPLANT
CANISTER SUCTION 2500CC (MISCELLANEOUS) ×3 IMPLANT
CLOSURE WOUND 1/2 X4 (GAUZE/BANDAGES/DRESSINGS) ×1
DECANTER SPIKE VIAL GLASS SM (MISCELLANEOUS) ×3 IMPLANT
DERMABOND ADVANCED (GAUZE/BANDAGES/DRESSINGS) ×2
DERMABOND ADVANCED .7 DNX12 (GAUZE/BANDAGES/DRESSINGS) ×1 IMPLANT
DRAIN PENROSE 18X1/2 LTX STRL (DRAIN) ×3 IMPLANT
DRAPE LAPAROTOMY TRNSV 102X78 (DRAPE) ×3 IMPLANT
ELECT REM PT RETURN 9FT ADLT (ELECTROSURGICAL) ×3
ELECTRODE REM PT RTRN 9FT ADLT (ELECTROSURGICAL) ×1 IMPLANT
GLOVE BIOGEL PI IND STRL 7.0 (GLOVE) ×1 IMPLANT
GLOVE BIOGEL PI INDICATOR 7.0 (GLOVE) ×2
GLOVE SURG ORTHO 8.0 STRL STRW (GLOVE) ×3 IMPLANT
GOWN STRL REUS W/TWL LRG LVL3 (GOWN DISPOSABLE) ×3 IMPLANT
GOWN STRL REUS W/TWL XL LVL3 (GOWN DISPOSABLE) ×9 IMPLANT
KIT BASIN OR (CUSTOM PROCEDURE TRAY) ×3 IMPLANT
MESH ULTRAPRO 3X6 7.6X15CM (Mesh General) ×3 IMPLANT
NEEDLE HYPO 25X1 1.5 SAFETY (NEEDLE) ×3 IMPLANT
NS IRRIG 1000ML POUR BTL (IV SOLUTION) ×3 IMPLANT
PACK BASIC VI WITH GOWN DISP (CUSTOM PROCEDURE TRAY) ×3 IMPLANT
PENCIL BUTTON HOLSTER BLD 10FT (ELECTRODE) ×3 IMPLANT
SPONGE GAUZE 4X4 12PLY (GAUZE/BANDAGES/DRESSINGS) ×3 IMPLANT
SPONGE LAP 4X18 X RAY DECT (DISPOSABLE) ×9 IMPLANT
STRIP CLOSURE SKIN 1/2X4 (GAUZE/BANDAGES/DRESSINGS) ×2 IMPLANT
SUT MNCRL AB 4-0 PS2 18 (SUTURE) ×3 IMPLANT
SUT NOVA 0 T19/GS 22DT (SUTURE) ×3 IMPLANT
SUT NOVA NAB GS-22 2 0 T19 (SUTURE) ×6 IMPLANT
SUT SILK 2 0 SH (SUTURE) ×3 IMPLANT
SUT VIC AB 3-0 SH 18 (SUTURE) ×3 IMPLANT
SYR BULB IRRIGATION 50ML (SYRINGE) ×3 IMPLANT
SYR CONTROL 10ML LL (SYRINGE) ×3 IMPLANT
TOWEL OR 17X26 10 PK STRL BLUE (TOWEL DISPOSABLE) ×3 IMPLANT
YANKAUER SUCT BULB TIP 10FT TU (MISCELLANEOUS) ×3 IMPLANT

## 2014-01-29 NOTE — H&P (View-Only) (Signed)
General Surgery [facility]  Chief Complaint  Patient presents with  . Follow-up    Evaluate for [facility] repair - primary care is Dr. Gaynelle Arabian; cardiology is Dr. Jolyn Nap    HISTORY: Patient is a 80 year old male well known to my practice. He underwent left inguinal hernia repair in November 2014. This was the larger and more symptomatic of his bilateral hernias. He now returns to consider right inguinal hernia repair.  Patient is noted at the right inguinal hernia has become larger over the past several months. He denies any signs or symptoms of obstruction. He has minor intermittent discomfort. He has minor intermittent discomfort in the left groin as well.  Patient remains on Xarelto for treatment of atrial fibrillation. He is followed by cardiology.  PERTINENT REVIEW OF SYSTEMS: Denies signs or symptoms of obstruction. Intermittent discomfort. Gradual enlargement.  EXAM: HEENT: normocephalic; pupils equal and reactive; sclerae clear; dentition good; mucous membranes moist NECK:  symmetric on extension; no palpable anterior or posterior cervical lymphadenopathy; no supraclavicular masses; no tenderness CHEST: clear to auscultation bilaterally without rales, rhonchi, or wheezes CARDIAC: Controlled rate without significant murmur; peripheral pulses are full EXT:  non-tender without edema; no deformity GU:  Left groin with well-healed surgical incision; palpation with cough and Valsalva shows no sign of recurrence on the left; palpation in the right groin shows a moderate to large inguinal hernia, probably direct; this is reducible with some manipulation and is mildly tender  IMPRESSION: #1 right inguinal hernia, moderately large, mildly symptomatic #2 status post left inguinal hernia repair with mesh, no evidence of recurrence #3 atrial fibrillation on anticoagulation  PLAN: I discussed with the patient the indications for repair of his right inguinal  hernia. We have discussed this on previous occasions. He would like to proceed with surgery in the near future. We will arrange for this as an outpatient surgery to be performed in the hospital due to his cardiac issues. We will stop his anti-coagulation 2 days prior to his procedure. We discussed the risk and benefits. We discussed the use of prosthetic mesh. We discussed restrictions on his activities following the procedure. Patient understands and wishes to proceed.  The risks and benefits of the procedure have been discussed at length with the patient.  The patient understands the proposed procedure, potential alternative treatments, and the course of recovery to be expected.  All of the patient's questions have been answered at this time.  The patient wishes to proceed with surgery.  Earnstine Regal, MD, [facility] Office: [phone]  Visit Diagnoses: 1. Right inguinal hernia, direct

## 2014-01-29 NOTE — Anesthesia Preprocedure Evaluation (Addendum)
Anesthesia Evaluation  Patient identified by MRN, date of birth, ID band Patient awake    Reviewed: Allergy & Precautions, H&P , NPO status , Patient's Chart, lab work & pertinent test results, reviewed documented beta blocker date and time   Airway Mallampati: II TM Distance: >3 FB Neck ROM: full    Dental  (+) Edentulous Upper, Edentulous Lower   Pulmonary neg pulmonary ROS, former smoker,  breath sounds clear to auscultation  Pulmonary exam normal       Cardiovascular Exercise Tolerance: Good hypertension, Pt. on home beta blockers and Pt. on medications + dysrhythmias Atrial Fibrillation Rhythm:regular Rate:Normal  History asymptomatic bradycardia. ECG AF   Neuro/Psych Peripheral neuropahty  Neuromuscular disease negative neurological ROS  negative psych ROS   GI/Hepatic negative GI ROS, Neg liver ROS,   Endo/Other  negative endocrine ROS  Renal/GU negative Renal ROS  negative genitourinary   Musculoskeletal   Abdominal   Peds  Hematology negative hematology ROS (+) anemia ,   Anesthesia Other Findings   Reproductive/Obstetrics                         Anesthesia Physical Anesthesia Plan  ASA: III  Anesthesia Plan: General   Post-op Pain Management:    Induction: Intravenous  Airway Management Planned: Oral ETT  Additional Equipment:   Intra-op Plan:   Post-operative Plan: Extubation in OR  Informed Consent: I have reviewed the patients History and Physical, chart, labs and discussed the procedure including the risks, benefits and alternatives for the proposed anesthesia with the patient or authorized representative who has indicated his/her understanding and acceptance.   Dental advisory given  Plan Discussed with: CRNA  Anesthesia Plan Comments:         Anesthesia Quick Evaluation

## 2014-01-29 NOTE — Anesthesia Postprocedure Evaluation (Signed)
  Anesthesia Post-op Note Anesthesia Post Note  Patient: Cameron [name]  Procedure(s) Performed: Procedure(s) (LRB):  REPAIR RIGHT INGUNIAL HERNIA WITH MESH (Right)  Anesthesia type: General  Patient location: PACU  Post pain: Pain level controlled  Post assessment: Post-op Vital signs reviewed  Last Vitals:  Filed Vitals:   01/29/14 1122  BP: 143/83  Pulse: 87  Temp: 36.6 C  Resp: 18    Post vital signs: Reviewed  Level of consciousness: sedated  Complications: No apparent anesthesia complications

## 2014-01-29 NOTE — Interval H&P Note (Signed)
History and Physical Interval Note:  01/29/2014 7:17 AM  Cameron [name]  has presented today for surgery, with the diagnosis of inguinal hernia.  The various methods of treatment have been discussed with the patient and family. After consideration of risks, benefits and other options for treatment, the patient has consented to    Procedure(s):  REPAIR RIGHT INGUNIAL HERNIA WITH MESH (Right) as a surgical intervention .    The patient's history has been reviewed, patient examined, no change in status, stable for surgery.  I have reviewed the patient's chart and labs.  Questions were answered to the patient's satisfaction.    Earnstine Regal, MD, [facility] Office: [place]

## 2014-01-29 NOTE — Op Note (Signed)
Inguinal Hernia, Open, Procedure Note  Pre-operative Diagnosis:  Right inguinal hernia, direct  Post-operative Diagnosis: same  Surgeon:  Earnstine Regal, MD, FACS  Anesthesia:  General  Preparation:  Chlora-prep  Estimated Blood Loss: Minimal  Complications:  none  Indications: The patient presented with a right, reducible hernia.    Procedure Details  The patient was evaluated in the holding area. All of the patient's questions were answered and the proposed procedure was confirmed. The site of the procedure was properly marked. The patient was taken to the Operating Room, identified by name, and the procedure verified as inguinal hernia repair.  The patient was placed in the supine position and underwent induction of anesthesia. A "Time Out" was performed per routine. The lower abdomen and groin were prepped and draped in the usual aseptic fashion.  After ascertaining that an adequate level of anesthesia had been obtained, an incision was made in the groin with a #10 blade.  Dissection was carried through the subcutaneous tissues and hemostasis obtained with the electrocautery.  A Gelpi retractor was placed for exposure.  The external oblique fascia was incised in line with it's fibers and extended through the external inguinal ring.  The cord structures were dissected out of the inguinal canal and encircled with a Penrose drain.  The floor of the inguinal canal was dissected out.  There was a large direct inguinal hernia.  This was dissected out and reduced.  The floor was closed with interrupted 0-Novofil sutures.  The cord was explored and a small indirect hernia sac was dissected out.  A high ligation was performed with 0-Novofil suture.  The sac was excised and discarded.  The floor of the inguinal canal was reconstructed with Ethicon Ultrapro mesh cut to the appropriate dimensions.  It was secured to the pubic tubercle with a 2-0 Novafil suture and along the inguinal ligament with a  running 2-0 Novafil suture.  Mesh was split to accommodate the cord structures.  The superior margin of the mesh was secured to the transversalis and internal oblique musculature with interrupted 2-0 Novafil sutures.  The tails of the mesh were overlapped lateral to the cord structures and secured to the inguinal ligament with interrupted 2-0 Novafil sutures to recreate the internal inguinal ring.  Cord structures were returned to the inguinal canal.  Local anesthetic was infiltrated throughout the field.  External oblique fascia was closed with interrupted 3-0 Vicryl sutures.  Subcutaneous tissues were closed with interrupted 3-0 Vicryl sutures.  Skin was anesthetized with local anesthetic, and the skin edges were re-approximated with a running 4-0 Monocryl suture.  Wound was washed and dried and Dermabond was applied as dressing.  Instrument, sponge, and needle counts were correct prior to closure and at the conclusion of the case.  The patient tolerated the procedure well.  The patient was awakened from anesthesia and brought to the recovery room in stable condition.  Earnstine Regal, MD, [facility] Office: [phone]

## 2014-01-29 NOTE — Transfer of Care (Signed)
Immediate Anesthesia Transfer of Care Note  Patient: Cameron [name]  Procedure(s) Performed: Procedure(s):  REPAIR RIGHT INGUNIAL HERNIA WITH MESH (Right)  Patient Location: PACU  Anesthesia Type:General  Level of Consciousness: awake and patient cooperative  Airway & Oxygen Therapy: Patient Spontanous Breathing and Patient connected to face mask oxygen  Post-op Assessment: Report given to PACU RN and Post -op Vital signs reviewed and stable  Post vital signs: stable  Complications: No apparent anesthesia complications

## 2014-01-29 NOTE — Progress Notes (Signed)
Pt left C-Pap, and tubing  at home

## 2014-01-30 ENCOUNTER — Encounter (HOSPITAL_COMMUNITY): Payer: Self-pay | Admitting: Surgery

## 2014-02-07 ENCOUNTER — Encounter: Payer: Self-pay | Admitting: Cardiology

## 2014-02-13 ENCOUNTER — Encounter: Payer: Self-pay | Admitting: General Surgery

## 2014-02-16 ENCOUNTER — Ambulatory Visit (INDEPENDENT_AMBULATORY_CARE_PROVIDER_SITE_OTHER): Payer: Medicare Other | Admitting: Cardiology

## 2014-02-16 ENCOUNTER — Encounter: Payer: Self-pay | Admitting: Cardiology

## 2014-02-16 VITALS — BP 142/78 | HR 87

## 2014-02-16 DIAGNOSIS — G4733 Obstructive sleep apnea (adult) (pediatric): Secondary | ICD-10-CM | POA: Insufficient documentation

## 2014-02-16 DIAGNOSIS — I10 Essential (primary) hypertension: Secondary | ICD-10-CM

## 2014-02-16 DIAGNOSIS — I482 Chronic atrial fibrillation, unspecified: Secondary | ICD-10-CM

## 2014-02-16 DIAGNOSIS — I5032 Chronic diastolic (congestive) heart failure: Secondary | ICD-10-CM | POA: Diagnosis not present

## 2014-02-16 DIAGNOSIS — I4891 Unspecified atrial fibrillation: Secondary | ICD-10-CM | POA: Diagnosis not present

## 2014-02-16 NOTE — Progress Notes (Signed)
[place], [address] [place], [place]  [address] Phone: [phone] Fax:  [phone]  Date:  02/16/2014   ID:  ADIAN JABLONOWSKI, DOB November 15, 1933, MRN [id number]  PCP:  Simona Huh, MD  Cardiologist:  Fransico Him, MD     History of Present Illness: Cameron [name] is a 80 y.o. male with chronic atrial fibrillation, asymptomatic bradycardia, systemic anticoagulation and newly diagnosed OSA who presents today for followup.  He recently underwent PSG showing moderate OSA with an AHI of 17/hr and had a successful CPAP titration to 7cm H2O.  He is doing well with his device.  He feels rested in the am and has no daytime sleepiness.  He tolerates his nasal pillow mask with chin strap without any problems and feels the pressure is adequate.  His most recent download showed an AHI of 4.8/hr and 93% compliance in using more than 4 hours nightly.   Wt Readings from Last 3 Encounters:  01/22/14 208 lb (94.348 kg)  01/06/14 211 lb (95.709 kg)  11/20/13 207 lb 4 oz (94.008 kg)     Past Medical History  Diagnosis Date  . Rosacea   . Gout     in 70's, "not in toe but everywhere else"  . Vitamin B12 deficiency   . Arthritis   . Neuromuscular disorder   . Peripheral neuropathy     both feet, can feel pain  . Hypertension   . Chronic anticoagulation   . Low back pain     when lying flat  . Anemia     hx of  . Swelling     both legs sometimes  . OSA (obstructive sleep apnea)     moderate with AHI 17.53/hr now on CPAP at 7cm H2O  . Chronic atrial fibrillation     Current Outpatient Prescriptions  Medication Sig Dispense Refill  . bimatoprost (LUMIGAN) 0.01 % SOLN Place 1 drop into both eyes at bedtime.       . cyanocobalamin (,VITAMIN B-12,) 1000 MCG/ML injection Inject 1,000 mcg into the muscle every 30 (thirty) days.       Marland Kitchen doxazosin (CARDURA) 4 MG tablet Take 4 mg by mouth every evening.       . fish oil-omega-3 fatty acids 1000 MG capsule Take 1 g by mouth 2 (two) times  daily.       . folic acid (FOLVITE) 1 MG tablet Take 1 mg by mouth daily.       . furosemide (LASIX) 40 MG tablet Take 40 mg by mouth every evening.      . furosemide (LASIX) 40 MG tablet TAKE 1 TABLET BY MOUTH EVERY DAY AND EXTRA AS DIRECTED  100 tablet  0  . gabapentin (NEURONTIN) 300 MG capsule Take 300 mg by mouth 3 (three) times daily as needed (pain).       Marland Kitchen HYDROcodone-acetaminophen (NORCO/VICODIN) 5-325 MG per tablet Take 1-2 tablets by mouth every 4 (four) hours as needed for moderate pain.  30 tablet  0  . meloxicam (MOBIC) 15 MG tablet Take 15 mg by mouth daily as needed for pain.       . metoprolol succinate (TOPROL-XL) 25 MG 24 hr tablet Take 25 mg by mouth every morning.      . rivaroxaban (XARELTO) 20 MG TABS tablet Take 20 mg by mouth daily with supper.      . traMADol (ULTRAM) 50 MG tablet Take 50 mg by mouth every 4 (four) hours as needed for moderate  pain or severe pain. Maximum dose= 8 tablets per day       No current facility-administered medications for this visit.    Allergies:    Allergies  Allergen Reactions  . Ace Inhibitors     Lips inflated to about "20 pound"  . Clonidine Derivatives Rash    Social History:  The patient  reports that he quit smoking about 39 years ago. His smoking use included Cigarettes. He smoked 2.00 packs per day. He has never used smokeless tobacco. He reports that he drinks alcohol. He reports that he does not use illicit drugs.   Family History:  The patient's family history includes Cancer in his father; Dementia in his sister; Stroke (age of onset: 53) in his mother.   ROS:  Please see the history of present illness.      All other systems reviewed and negative.   PHYSICAL EXAM: VS:  BP 142/78  Pulse 87  SpO2 98% Well nourished, well developed, in no acute distress HEENT: normal Neck: no JVD Cardiac:  normal S1, S2; RRR; no murmur Lungs:  clear to auscultation bilaterally, no wheezing, rhonchi or rales Abd: soft, nontender,  no hepatomegaly Ext: no edema Skin: warm and dry Neuro:  CNs 2-12 intact, no focal abnormalities noted  ASSESSMENT AND PLAN:  1.  Chronic atrial fibrillation/flutter  - continue Xarelto/Toprol  2.  Asymptomatic bradycardia - resolved 3.  Moderate OSA with AHI 17.53/hr now on CPAP at 7cm H2O study.  His last download showed an AHI <5 on CPAP and good compliance. 4.  HTN - well controlled - continue Doxazosin  5. Chronic LE edema - controlled on diuretics 6. Chronic diastolic CHF - appears euvolemic  Followup with me in 6 months      Signed, Fransico Him, MD 02/16/2014 9:05 AM

## 2014-02-16 NOTE — Patient Instructions (Signed)
Your physician recommends that you continue on your current medications as directed. Please refer to the Current Medication list given to you today.  Your physician wants you to follow-up in: 6 months with Dr Turner You will receive a reminder letter in the mail two months in advance. If you don't receive a letter, please call our office to schedule the follow-up appointment.  

## 2014-02-18 DIAGNOSIS — D510 Vitamin B12 deficiency anemia due to intrinsic factor deficiency: Secondary | ICD-10-CM | POA: Diagnosis not present

## 2014-02-24 ENCOUNTER — Encounter (INDEPENDENT_AMBULATORY_CARE_PROVIDER_SITE_OTHER): Payer: Self-pay | Admitting: Surgery

## 2014-02-24 ENCOUNTER — Ambulatory Visit (INDEPENDENT_AMBULATORY_CARE_PROVIDER_SITE_OTHER): Payer: Medicare Other | Admitting: Surgery

## 2014-02-24 VITALS — BP 148/82 | HR 80 | Temp 97.9°F | Resp 16 | Ht 70.0 in | Wt 213.4 lb

## 2014-02-24 DIAGNOSIS — K4090 Unilateral inguinal hernia, without obstruction or gangrene, not specified as recurrent: Secondary | ICD-10-CM

## 2014-02-24 NOTE — Progress Notes (Signed)
General Surgery [facility]  Chief Complaint  Patient presents with  . Routine Post Op    RIH repair with mesh 01/29/2014    HISTORY: Patient is a 80 year old male who underwent right inguinal hernia repair with mesh on 01/29/2014. Postoperative course has been uneventful.  EXAM: Surgical wound is healing nicely. No sign or symptom of infection. Palpation in the right inguinal canal with cough and Valsalva shows no sign of recurrence. There is moderate soft tissue edema.  IMPRESSION: Status post right inguinal hernia repair with mesh  PLAN: Patient will return in 6 weeks for final wound check.  Earnstine Regal, MD, [facility]   Visit Diagnoses: 1. Right inguinal hernia, direct

## 2014-02-24 NOTE — Patient Instructions (Signed)
  CARE OF INCISION   Apply cocoa butter/vitamin E cream (Palmer's brand) to your incision 2 - 3 times daily.  Massage cream into incision for one minute with each application.  Use sunscreen (50 SPF or higher) for first 6 months after surgery if area is exposed to sun.  You may alternate Mederma or other scar reducing cream with cocoa butter cream if desired.       Abryanna Musolino M. Yehya Brendle, MD, FACS      [facility]      Office: [phone]    

## 2014-03-23 DIAGNOSIS — D510 Vitamin B12 deficiency anemia due to intrinsic factor deficiency: Secondary | ICD-10-CM | POA: Diagnosis not present

## 2014-04-08 ENCOUNTER — Encounter (INDEPENDENT_AMBULATORY_CARE_PROVIDER_SITE_OTHER): Payer: Medicare Other | Admitting: Surgery

## 2014-04-14 ENCOUNTER — Ambulatory Visit (INDEPENDENT_AMBULATORY_CARE_PROVIDER_SITE_OTHER): Payer: Medicare Other | Admitting: Surgery

## 2014-04-14 ENCOUNTER — Encounter (INDEPENDENT_AMBULATORY_CARE_PROVIDER_SITE_OTHER): Payer: Self-pay | Admitting: Surgery

## 2014-04-14 VITALS — BP 130/74 | HR 72 | Temp 98.0°F | Ht 70.0 in | Wt 210.0 lb

## 2014-04-14 DIAGNOSIS — K4090 Unilateral inguinal hernia, without obstruction or gangrene, not specified as recurrent: Secondary | ICD-10-CM

## 2014-04-14 NOTE — Progress Notes (Signed)
General Surgery [facility]  Chief Complaint  Patient presents with  . Routine Post Op    RIH repair 01/29/2014    HISTORY: Patient is a 80 year old male who underwent right inguinal hernia repair with mesh in April. He returns for a final wound check. Overall he is doing well. His activity level is good. He denies any pain.  EXAM: Surgical wounds are well healed. No sign of infection. Palpation in the inguinal canals bilaterally showed no sign of recurrence.  IMPRESSION: Status post right and left inguinal hernia repairs with mesh  PLAN: Patient is released to full activity without restriction.  Patient will return for surgical care as needed.  Earnstine Regal, MD, [facility]   Visit Diagnoses: 1. Right inguinal hernia, direct

## 2014-04-19 ENCOUNTER — Other Ambulatory Visit: Payer: Self-pay | Admitting: Cardiology

## 2014-04-20 DIAGNOSIS — D510 Vitamin B12 deficiency anemia due to intrinsic factor deficiency: Secondary | ICD-10-CM | POA: Diagnosis not present

## 2014-04-20 DIAGNOSIS — I4891 Unspecified atrial fibrillation: Secondary | ICD-10-CM | POA: Diagnosis not present

## 2014-04-28 DIAGNOSIS — H4011X0 Primary open-angle glaucoma, stage unspecified: Secondary | ICD-10-CM | POA: Diagnosis not present

## 2014-04-28 DIAGNOSIS — Z961 Presence of intraocular lens: Secondary | ICD-10-CM | POA: Diagnosis not present

## 2014-04-28 DIAGNOSIS — H409 Unspecified glaucoma: Secondary | ICD-10-CM | POA: Diagnosis not present

## 2014-04-28 DIAGNOSIS — H18599 Other hereditary corneal dystrophies, unspecified eye: Secondary | ICD-10-CM | POA: Diagnosis not present

## 2014-05-18 DIAGNOSIS — D510 Vitamin B12 deficiency anemia due to intrinsic factor deficiency: Secondary | ICD-10-CM | POA: Diagnosis not present

## 2014-06-15 DIAGNOSIS — Z23 Encounter for immunization: Secondary | ICD-10-CM | POA: Diagnosis not present

## 2014-06-15 DIAGNOSIS — D510 Vitamin B12 deficiency anemia due to intrinsic factor deficiency: Secondary | ICD-10-CM | POA: Diagnosis not present

## 2014-07-13 DIAGNOSIS — D510 Vitamin B12 deficiency anemia due to intrinsic factor deficiency: Secondary | ICD-10-CM | POA: Diagnosis not present

## 2014-07-20 ENCOUNTER — Encounter: Payer: Self-pay | Admitting: Cardiology

## 2014-07-26 ENCOUNTER — Encounter: Payer: Self-pay | Admitting: Cardiology

## 2014-08-10 DIAGNOSIS — E538 Deficiency of other specified B group vitamins: Secondary | ICD-10-CM | POA: Diagnosis not present

## 2014-08-11 DIAGNOSIS — H4011X2 Primary open-angle glaucoma, moderate stage: Secondary | ICD-10-CM | POA: Diagnosis not present

## 2014-08-12 ENCOUNTER — Telehealth: Payer: Self-pay | Admitting: Cardiology

## 2014-08-17 NOTE — Telephone Encounter (Signed)
Received request from Nurse fax box:   [name] Fax number: [phone]

## 2014-08-19 ENCOUNTER — Encounter: Payer: Self-pay | Admitting: Cardiology

## 2014-08-19 ENCOUNTER — Ambulatory Visit (INDEPENDENT_AMBULATORY_CARE_PROVIDER_SITE_OTHER): Payer: Medicare Other | Admitting: Cardiology

## 2014-08-19 VITALS — BP 132/70 | HR 62 | Ht 70.0 in | Wt 213.0 lb

## 2014-08-19 DIAGNOSIS — G4733 Obstructive sleep apnea (adult) (pediatric): Secondary | ICD-10-CM

## 2014-08-19 DIAGNOSIS — I10 Essential (primary) hypertension: Secondary | ICD-10-CM | POA: Diagnosis not present

## 2014-08-19 DIAGNOSIS — I5032 Chronic diastolic (congestive) heart failure: Secondary | ICD-10-CM

## 2014-08-19 DIAGNOSIS — I482 Chronic atrial fibrillation, unspecified: Secondary | ICD-10-CM

## 2014-08-19 DIAGNOSIS — R0602 Shortness of breath: Secondary | ICD-10-CM

## 2014-08-19 LAB — BRAIN NATRIURETIC PEPTIDE: Pro B Natriuretic peptide (BNP): 26 pg/mL (ref 0.0–100.0)

## 2014-08-19 NOTE — Progress Notes (Signed)
[name], [name], [name] Phone: [phone] Fax:  [phone]  Date:  08/19/2014   ID:  DILLIAN FEIG, DOB [birth year], MRN [id number]  PCP:  Simona Huh, MD  Cardiologist: Fransico Him, MD    History of Present Illness: Cameron [name] is a 80 y.o. male with chronic atrial fibrillation, asymptomatic bradycardia, systemic anticoagulation and newly diagnosed OSA who presents today for followup.  He is doing well with his device. He feels rested in the am and has no daytime sleepiness. He tolerates his nasal pillow mask with chin strap without any problems and feels the pressure is adequate. He denies any chest pain.  He has noticed that he is getting SOB and seems to be getting worse when he takes his garbage cans out.  He has occasional LLE edema at the ankle.  He denies any palpitations.    Wt Readings from Last 3 Encounters:  08/19/14 213 lb (96.616 kg)  04/14/14 210 lb (95.255 kg)  02/24/14 213 lb 6.4 oz (96.798 kg)     Past Medical History  Diagnosis Date  . Rosacea   . Gout     in 70's, "not in toe but everywhere else"  . Vitamin B12 deficiency   . Arthritis   . Neuromuscular disorder   . Peripheral neuropathy     both feet, can feel pain  . Hypertension   . Chronic anticoagulation   . Low back pain     when lying flat  . Anemia     hx of  . Swelling     both legs sometimes  . OSA (obstructive sleep apnea)     moderate with AHI 17.53/hr now on CPAP at 7cm H2O  . Chronic atrial fibrillation     Current Outpatient Prescriptions  Medication Sig Dispense Refill  . bimatoprost (LUMIGAN) 0.01 % SOLN Place 1 drop into both eyes at bedtime.     . cyanocobalamin (,VITAMIN B-12,) 1000 MCG/ML injection Inject 1,000 mcg into the muscle every 30 (thirty) days.     Marland Kitchen doxazosin (CARDURA) 4 MG tablet TAKE 1 TABLET BY MOUTH EVERY DAY 90 tablet 1  . fish oil-omega-3 fatty acids 1000 MG capsule Take 1 g by mouth 2 (two) times daily.     .  folic acid (FOLVITE) 1 MG tablet Take 1 mg by mouth daily.     . furosemide (LASIX) 40 MG tablet TAKE 1 TABLET BY MOUTH EVERY DAY AND EXTRA AS DIRECTED 100 tablet 1  . meloxicam (MOBIC) 15 MG tablet Take 15 mg by mouth daily as needed for pain.     . metoprolol succinate (TOPROL-XL) 25 MG 24 hr tablet Take 25 mg by mouth every morning.    . rivaroxaban (XARELTO) 20 MG TABS tablet Take 20 mg by mouth daily with supper.    . traMADol (ULTRAM) 50 MG tablet Take 50 mg by mouth every 4 (four) hours as needed for moderate pain or severe pain. Maximum dose= 8 tablets per day     No current facility-administered medications for this visit.    Allergies:    Allergies  Allergen Reactions  . Ace Inhibitors     Lips inflated to about "20 pound"  . Clonidine Derivatives Rash    Social History:  The patient  reports that he quit smoking about 39 years ago. His smoking use included Cigarettes. He smoked 2.00 packs per day. He has never used smokeless tobacco. He reports that he drinks  alcohol. He reports that he does not use illicit drugs.   Family History:  The patient's family history includes Cancer in his father; Dementia in his sister; Stroke (age of onset: 59) in his mother.   ROS:  Please see the history of present illness.      All other systems reviewed and negative.   PHYSICAL EXAM: VS:  BP 132/70 mmHg  Pulse 62  Ht 5\' 10"  (1.778 m)  Wt 213 lb (96.616 kg)  BMI 30.56 kg/m2 Well nourished, well developed, in no acute distress HEENT: normal Neck: no JVD Cardiac:  normal S1, S2; RRR; no murmur Lungs:  clear to auscultation bilaterally, no wheezing, rhonchi or rales Abd: soft, nontender, no hepatomegaly Ext: no edema Skin: warm and dry Neuro:  CNs 2-12 intact, no focal abnormalities noted   ASSESSMENT AND PLAN:  1. Chronic atrial fibrillation/flutter - rate controlled - continue Xarelto/Toprol  2. Asymptomatic bradycardia - resolved 3. Moderate OSA  - I will get a d/l from his  DME 4. HTN - well controlled - continue Doxazosin/Metoprolol 5. Chronic LE edema - controlled on diuretics - check BMET 6. Chronic diastolic CHF - appear 7.  SOB ? Whether this is related to his afib or chronic CHF.  Also need to rule out ischemia.   - check BNP - check Lexiscan myoveiw  Followup with me in 6 months Signed, Fransico Him, MD [facility] 08/19/2014 8:15 AM

## 2014-08-19 NOTE — Patient Instructions (Signed)
Your physician recommends that you have lab work TODAY (BNP).  Your physician has requested that you have a lexiscan myoview. For further information please visit [email or web address]. Please follow instruction sheet, as given.  Your physician recommends that you continue on your current medications as directed. Please refer to the Current Medication list given to you today.  Your physician wants you to follow-up in: 6 months with Dr. Radford Pax. You will receive a reminder letter in the mail two months in advance. If you don't receive a letter, please call our office to schedule the follow-up appointment.

## 2014-08-24 ENCOUNTER — Ambulatory Visit (HOSPITAL_COMMUNITY): Payer: Medicare Other | Attending: Cardiology | Admitting: Radiology

## 2014-08-24 DIAGNOSIS — R002 Palpitations: Secondary | ICD-10-CM | POA: Insufficient documentation

## 2014-08-24 DIAGNOSIS — I5032 Chronic diastolic (congestive) heart failure: Secondary | ICD-10-CM

## 2014-08-24 DIAGNOSIS — I10 Essential (primary) hypertension: Secondary | ICD-10-CM | POA: Diagnosis not present

## 2014-08-24 DIAGNOSIS — R079 Chest pain, unspecified: Secondary | ICD-10-CM | POA: Diagnosis not present

## 2014-08-24 DIAGNOSIS — I482 Chronic atrial fibrillation: Secondary | ICD-10-CM

## 2014-08-24 DIAGNOSIS — R0609 Other forms of dyspnea: Secondary | ICD-10-CM | POA: Diagnosis not present

## 2014-08-24 DIAGNOSIS — I4891 Unspecified atrial fibrillation: Secondary | ICD-10-CM | POA: Insufficient documentation

## 2014-08-24 DIAGNOSIS — R0602 Shortness of breath: Secondary | ICD-10-CM | POA: Diagnosis not present

## 2014-08-24 MED ORDER — TECHNETIUM TC 99M SESTAMIBI GENERIC - CARDIOLITE
10.0000 | Freq: Once | INTRAVENOUS | Status: AC | PRN
  Administered 2014-08-24: 10 via INTRAVENOUS

## 2014-08-24 MED ORDER — REGADENOSON 0.4 MG/5ML IV SOLN
0.4000 mg | Freq: Once | INTRAVENOUS | Status: AC
  Administered 2014-08-24: 0.4 mg via INTRAVENOUS

## 2014-08-24 MED ORDER — TECHNETIUM TC 99M SESTAMIBI GENERIC - CARDIOLITE
30.0000 | Freq: Once | INTRAVENOUS | Status: AC | PRN
  Administered 2014-08-24: 30 via INTRAVENOUS

## 2014-08-24 NOTE — Progress Notes (Signed)
[facility] [address] [place],  [address] [phone]    Cardiology Nuclear Med Study  Cameron [name] is a 80 y.o. male     MRN : [id number]     DOB: September 16, 1934  Procedure Date: 08/24/2014  Nuclear Med Background Indication for Stress Test:  Evaluation for Ischemia History:  AFIB,'13 MPI: EF: 71% NL  Cardiac Risk Factors: Hypertension and Atrial Fib  Symptoms:  Chest Pain, DOE, Palpitations and SOB   Nuclear Pre-Procedure Caffeine/Decaff Intake:  None NPO After: 7:00pm   Lungs:  clear O2 Sat: 98% on room air. IV 0.9% NS with Angio Cath:  22g  IV Site: R Hand  IV Started by:  Matilde Haymaker, RN  Chest Size (in):  44 Cup Size: n/a  Height: 5\' 10"  (1.778 m)  Weight:  208 lb (94.348 kg)  BMI:  Body mass index is 29.84 kg/(m^2). Tech Comments:  No Toprol x 12 hrs    Nuclear Med Study 1 or 2 day study: 1 day  Stress Test Type:  Lexiscan  Reading MD: n/a  Order Authorizing Provider:  Tressia Miners Turner,MD  Resting Radionuclide: Technetium 40m Sestamibi  Resting Radionuclide Dose: 11.0 mCi   Stress Radionuclide:  Technetium 46m Sestamibi  Stress Radionuclide Dose: 33.0 mCi           Stress Protocol Rest HR: 81 Stress HR: 91  Rest BP: 158/89 Stress BP: 151/76  Exercise Time (min): n/a METS: n/a   Predicted Max HR: 142 bpm % Max HR: 64.08 bpm Rate Pressure Product: 14378   Dose of Adenosine (mg):  n/a Dose of Lexiscan: 0.4 mg  Dose of Atropine (mg): n/a Dose of Dobutamine: n/a mcg/kg/min (at max HR)  Stress Test Technologist: Perrin Maltese, EMT-P  Nuclear Technologist:  Vedia Pereyra, CNMT     Rest Procedure:  Myocardial perfusion imaging was performed at rest 45 minutes following the intravenous administration of Technetium 25m Sestamibi. Rest ECG: Atrial fibrillation with controlled ventricular rate. No significant ST changes.  Stress Procedure:  The patient received IV Lexiscan 0.4 mg over 15-seconds.  Technetium 52m  Sestamibi injected at 30-seconds. This patient was sob with the Lexiscan injection. Quantitative spect images were obtained after a 45 minute delay. Stress ECG: No significant change from baseline ECG  QPS Raw Data Images:  Normal; no motion artifact; normal heart/lung ratio. Stress Images:  There are a few scattered areas of slight decreased uptake. These areas are fixed. They do not represent a significant finding. Rest Images:  The rest images are the same as the stress images. Subtraction (SDS):  No evidence of ischemia. Transient Ischemic Dilatation (Normal <1.22):  0.94 Lung/Heart Ratio (Normal <0.45):  0.34  Quantitative Gated Spect Images QGS EDV:  98 ml QGS ESV:  43 ml  Impression Exercise Capacity:  Lexiscan with no exercise. BP Response:  Normal blood pressure response. Clinical Symptoms:  Shortness of breath ECG Impression:  No significant ST segment change suggestive of ischemia. Comparison with Prior Nuclear Study: The study is compared to the report of the study from January, 2013  Overall Impression:  Normal stress nuclear study.  There is no scar or ischemia. This is a low risk scan. There are no significant abnormalities. There is no significant change since the report of the study from January, 2013.  LV Ejection Fraction: 56%.  LV Wall Motion:  Normal Wall Motion.  Dola Argyle, MD

## 2014-08-25 ENCOUNTER — Telehealth: Payer: Self-pay

## 2014-08-25 DIAGNOSIS — R0602 Shortness of breath: Secondary | ICD-10-CM

## 2014-08-25 NOTE — Telephone Encounter (Signed)
Informed patient of stress test results. Instructed patient that Dr. Radford Pax recommends PFTs and he will be contacted by scheduling to make an appointment.   Patient agrees with treatment plan.

## 2014-08-25 NOTE — Telephone Encounter (Signed)
-----   Message from Sueanne Margarita, MD sent at 08/25/2014  8:09 AM EST ----- Since BNP and stress test were normal please order PFTs with [facility]

## 2014-09-01 ENCOUNTER — Other Ambulatory Visit: Payer: Self-pay | Admitting: Gastroenterology

## 2014-09-01 ENCOUNTER — Telehealth: Payer: Self-pay | Admitting: Cardiology

## 2014-09-01 DIAGNOSIS — D125 Benign neoplasm of sigmoid colon: Secondary | ICD-10-CM | POA: Diagnosis not present

## 2014-09-01 DIAGNOSIS — K635 Polyp of colon: Secondary | ICD-10-CM | POA: Diagnosis not present

## 2014-09-01 DIAGNOSIS — Z86010 Personal history of colonic polyps: Secondary | ICD-10-CM | POA: Diagnosis not present

## 2014-09-01 DIAGNOSIS — Z980 Intestinal bypass and anastomosis status: Secondary | ICD-10-CM | POA: Diagnosis not present

## 2014-09-01 DIAGNOSIS — K5730 Diverticulosis of large intestine without perforation or abscess without bleeding: Secondary | ICD-10-CM | POA: Diagnosis not present

## 2014-09-01 DIAGNOSIS — Z09 Encounter for follow-up examination after completed treatment for conditions other than malignant neoplasm: Secondary | ICD-10-CM | POA: Diagnosis not present

## 2014-09-01 NOTE — Telephone Encounter (Signed)
New problem   Pt's son is calling about his father is waiting for a call back to sched a breathing study. Pt's son want someone to call pt to sched b/c he knows  his father won't call to see why test hasn't been schedule. Please call pt.

## 2014-09-01 NOTE — Telephone Encounter (Signed)
Instructed patient's son to call Thursday to schedule if he does not hear from anyone tomorrow.

## 2014-09-01 NOTE — Telephone Encounter (Signed)
To [facility] for scheduling.

## 2014-09-02 ENCOUNTER — Inpatient Hospital Stay (HOSPITAL_COMMUNITY)
Admission: EM | Admit: 2014-09-02 | Discharge: 2014-09-08 | DRG: 408 | Disposition: A | Discharge status: Home / Self Care | Payer: Medicare Other | Attending: Internal Medicine | Admitting: Internal Medicine

## 2014-09-02 ENCOUNTER — Ambulatory Visit
Admission: RE | Admit: 2014-09-02 | Discharge: 2014-09-02 | Disposition: A | Discharge status: Home / Self Care | Payer: Medicare Other | Source: Ambulatory Visit | Attending: Family Medicine | Admitting: Family Medicine

## 2014-09-02 ENCOUNTER — Other Ambulatory Visit: Payer: Self-pay | Admitting: Family Medicine

## 2014-09-02 ENCOUNTER — Emergency Department (HOSPITAL_COMMUNITY): Payer: Medicare Other

## 2014-09-02 ENCOUNTER — Encounter (HOSPITAL_COMMUNITY): Payer: Self-pay | Admitting: Emergency Medicine

## 2014-09-02 DIAGNOSIS — Z87891 Personal history of nicotine dependence: Secondary | ICD-10-CM

## 2014-09-02 DIAGNOSIS — K8020 Calculus of gallbladder without cholecystitis without obstruction: Secondary | ICD-10-CM | POA: Diagnosis not present

## 2014-09-02 DIAGNOSIS — Z79899 Other long term (current) drug therapy: Secondary | ICD-10-CM

## 2014-09-02 DIAGNOSIS — K8050 Calculus of bile duct without cholangitis or cholecystitis without obstruction: Secondary | ICD-10-CM | POA: Diagnosis present

## 2014-09-02 DIAGNOSIS — G629 Polyneuropathy, unspecified: Secondary | ICD-10-CM | POA: Diagnosis present

## 2014-09-02 DIAGNOSIS — K8062 Calculus of gallbladder and bile duct with acute cholecystitis without obstruction: Secondary | ICD-10-CM | POA: Diagnosis present

## 2014-09-02 DIAGNOSIS — J9601 Acute respiratory failure with hypoxia: Secondary | ICD-10-CM | POA: Diagnosis not present

## 2014-09-02 DIAGNOSIS — R4182 Altered mental status, unspecified: Secondary | ICD-10-CM

## 2014-09-02 DIAGNOSIS — R739 Hyperglycemia, unspecified: Secondary | ICD-10-CM | POA: Diagnosis present

## 2014-09-02 DIAGNOSIS — R17 Unspecified jaundice: Secondary | ICD-10-CM

## 2014-09-02 DIAGNOSIS — J80 Acute respiratory distress syndrome: Secondary | ICD-10-CM | POA: Diagnosis not present

## 2014-09-02 DIAGNOSIS — E860 Dehydration: Secondary | ICD-10-CM | POA: Diagnosis present

## 2014-09-02 DIAGNOSIS — E538 Deficiency of other specified B group vitamins: Secondary | ICD-10-CM | POA: Diagnosis present

## 2014-09-02 DIAGNOSIS — K810 Acute cholecystitis: Secondary | ICD-10-CM | POA: Diagnosis not present

## 2014-09-02 DIAGNOSIS — F05 Delirium due to known physiological condition: Secondary | ICD-10-CM | POA: Diagnosis not present

## 2014-09-02 DIAGNOSIS — I10 Essential (primary) hypertension: Secondary | ICD-10-CM | POA: Diagnosis present

## 2014-09-02 DIAGNOSIS — R7989 Other specified abnormal findings of blood chemistry: Secondary | ICD-10-CM | POA: Diagnosis present

## 2014-09-02 DIAGNOSIS — I5033 Acute on chronic diastolic (congestive) heart failure: Secondary | ICD-10-CM | POA: Diagnosis not present

## 2014-09-02 DIAGNOSIS — T501X5A Adverse effect of loop [high-ceiling] diuretics, initial encounter: Secondary | ICD-10-CM | POA: Diagnosis present

## 2014-09-02 DIAGNOSIS — R197 Diarrhea, unspecified: Secondary | ICD-10-CM | POA: Diagnosis present

## 2014-09-02 DIAGNOSIS — Z9049 Acquired absence of other specified parts of digestive tract: Secondary | ICD-10-CM | POA: Diagnosis present

## 2014-09-02 DIAGNOSIS — A419 Sepsis, unspecified organism: Secondary | ICD-10-CM | POA: Diagnosis not present

## 2014-09-02 DIAGNOSIS — R1013 Epigastric pain: Secondary | ICD-10-CM | POA: Diagnosis not present

## 2014-09-02 DIAGNOSIS — R799 Abnormal finding of blood chemistry, unspecified: Secondary | ICD-10-CM | POA: Diagnosis not present

## 2014-09-02 DIAGNOSIS — H409 Unspecified glaucoma: Secondary | ICD-10-CM | POA: Diagnosis present

## 2014-09-02 DIAGNOSIS — R1011 Right upper quadrant pain: Secondary | ICD-10-CM | POA: Diagnosis not present

## 2014-09-02 DIAGNOSIS — I5032 Chronic diastolic (congestive) heart failure: Secondary | ICD-10-CM | POA: Diagnosis present

## 2014-09-02 DIAGNOSIS — R748 Abnormal levels of other serum enzymes: Secondary | ICD-10-CM | POA: Diagnosis present

## 2014-09-02 DIAGNOSIS — E876 Hypokalemia: Secondary | ICD-10-CM | POA: Diagnosis present

## 2014-09-02 DIAGNOSIS — D510 Vitamin B12 deficiency anemia due to intrinsic factor deficiency: Secondary | ICD-10-CM | POA: Diagnosis not present

## 2014-09-02 DIAGNOSIS — Z1389 Encounter for screening for other disorder: Secondary | ICD-10-CM | POA: Diagnosis not present

## 2014-09-02 DIAGNOSIS — R0600 Dyspnea, unspecified: Secondary | ICD-10-CM | POA: Diagnosis not present

## 2014-09-02 DIAGNOSIS — M1990 Unspecified osteoarthritis, unspecified site: Secondary | ICD-10-CM | POA: Diagnosis present

## 2014-09-02 DIAGNOSIS — R0789 Other chest pain: Secondary | ICD-10-CM | POA: Diagnosis not present

## 2014-09-02 DIAGNOSIS — I4821 Permanent atrial fibrillation: Secondary | ICD-10-CM | POA: Diagnosis present

## 2014-09-02 DIAGNOSIS — K859 Acute pancreatitis, unspecified: Secondary | ICD-10-CM | POA: Diagnosis present

## 2014-09-02 DIAGNOSIS — J9600 Acute respiratory failure, unspecified whether with hypoxia or hypercapnia: Secondary | ICD-10-CM | POA: Diagnosis present

## 2014-09-02 DIAGNOSIS — R0602 Shortness of breath: Secondary | ICD-10-CM | POA: Diagnosis not present

## 2014-09-02 DIAGNOSIS — I4891 Unspecified atrial fibrillation: Secondary | ICD-10-CM | POA: Diagnosis not present

## 2014-09-02 DIAGNOSIS — I482 Chronic atrial fibrillation: Secondary | ICD-10-CM | POA: Diagnosis present

## 2014-09-02 DIAGNOSIS — I517 Cardiomegaly: Secondary | ICD-10-CM | POA: Diagnosis not present

## 2014-09-02 DIAGNOSIS — K819 Cholecystitis, unspecified: Secondary | ICD-10-CM | POA: Insufficient documentation

## 2014-09-02 DIAGNOSIS — R0781 Pleurodynia: Secondary | ICD-10-CM

## 2014-09-02 DIAGNOSIS — R6520 Severe sepsis without septic shock: Secondary | ICD-10-CM | POA: Diagnosis not present

## 2014-09-02 DIAGNOSIS — Z7901 Long term (current) use of anticoagulants: Secondary | ICD-10-CM | POA: Diagnosis not present

## 2014-09-02 DIAGNOSIS — M109 Gout, unspecified: Secondary | ICD-10-CM | POA: Diagnosis present

## 2014-09-02 DIAGNOSIS — R7301 Impaired fasting glucose: Secondary | ICD-10-CM | POA: Diagnosis present

## 2014-09-02 DIAGNOSIS — T50995A Adverse effect of other drugs, medicaments and biological substances, initial encounter: Secondary | ICD-10-CM | POA: Diagnosis present

## 2014-09-02 DIAGNOSIS — K822 Perforation of gallbladder: Secondary | ICD-10-CM | POA: Diagnosis not present

## 2014-09-02 DIAGNOSIS — K8063 Calculus of gallbladder and bile duct with acute cholecystitis with obstruction: Secondary | ICD-10-CM | POA: Diagnosis not present

## 2014-09-02 DIAGNOSIS — E781 Pure hyperglyceridemia: Secondary | ICD-10-CM | POA: Diagnosis not present

## 2014-09-02 DIAGNOSIS — R0603 Acute respiratory distress: Secondary | ICD-10-CM | POA: Insufficient documentation

## 2014-09-02 DIAGNOSIS — R918 Other nonspecific abnormal finding of lung field: Secondary | ICD-10-CM | POA: Diagnosis not present

## 2014-09-02 DIAGNOSIS — G4733 Obstructive sleep apnea (adult) (pediatric): Secondary | ICD-10-CM | POA: Diagnosis present

## 2014-09-02 DIAGNOSIS — J9811 Atelectasis: Secondary | ICD-10-CM | POA: Diagnosis not present

## 2014-09-02 DIAGNOSIS — Z0000 Encounter for general adult medical examination without abnormal findings: Secondary | ICD-10-CM | POA: Diagnosis not present

## 2014-09-02 DIAGNOSIS — R1084 Generalized abdominal pain: Secondary | ICD-10-CM

## 2014-09-02 DIAGNOSIS — R109 Unspecified abdominal pain: Secondary | ICD-10-CM | POA: Insufficient documentation

## 2014-09-02 HISTORY — DX: Acute cholecystitis: K81.0

## 2014-09-02 HISTORY — DX: Impaired fasting glucose: R73.01

## 2014-09-02 LAB — COMPREHENSIVE METABOLIC PANEL
ALBUMIN: 2.9 g/dL — AB (ref 3.5–5.2)
ALT: 26 U/L (ref 0–53)
ANION GAP: 20 — AB (ref 5–15)
AST: 39 U/L — AB (ref 0–37)
Alkaline Phosphatase: 94 U/L (ref 39–117)
BUN: 14 mg/dL (ref 6–23)
CALCIUM: 8.9 mg/dL (ref 8.4–10.5)
CO2: 20 mEq/L (ref 19–32)
Chloride: 92 mEq/L — ABNORMAL LOW (ref 96–112)
Creatinine, Ser: 1.07 mg/dL (ref 0.50–1.35)
GFR calc Af Amer: 75 mL/min — ABNORMAL LOW (ref >90)
GFR calc non Af Amer: 64 mL/min — ABNORMAL LOW (ref >90)
Glucose, Bld: 193 mg/dL — ABNORMAL HIGH (ref 70–99)
Potassium: 3.7 mEq/L (ref 3.7–5.3)
Sodium: 132 mEq/L — ABNORMAL LOW (ref 137–147)
Total Bilirubin: 4 mg/dL — ABNORMAL HIGH (ref 0.3–1.2)
Total Protein: 7.2 g/dL (ref 6.0–8.3)

## 2014-09-02 LAB — URINALYSIS, ROUTINE W REFLEX MICROSCOPIC
Glucose, UA: NEGATIVE mg/dL
Ketones, ur: NEGATIVE mg/dL
NITRITE: POSITIVE — AB
Protein, ur: 100 mg/dL — AB
Urobilinogen, UA: 4 mg/dL — ABNORMAL HIGH (ref 0.0–1.0)
pH: 6 (ref 5.0–8.0)

## 2014-09-02 LAB — URINE MICROSCOPIC-ADD ON

## 2014-09-02 LAB — CBC WITH DIFFERENTIAL/PLATELET
Basophils Absolute: 0 10*3/uL (ref 0.0–0.1)
Basophils Relative: 0 % (ref 0–1)
EOS ABS: 0 10*3/uL (ref 0.0–0.7)
EOS PCT: 0 % (ref 0–5)
HEMATOCRIT: 37.5 % — AB (ref 39.0–52.0)
HEMOGLOBIN: 12.6 g/dL — AB (ref 13.0–17.0)
Lymphocytes Relative: 2 % — ABNORMAL LOW (ref 12–46)
Lymphs Abs: 0.5 10*3/uL — ABNORMAL LOW (ref 0.7–4.0)
MCH: 28.6 pg (ref 26.0–34.0)
MCHC: 33.6 g/dL (ref 30.0–36.0)
MCV: 85 fL (ref 78.0–100.0)
MONO ABS: 2 10*3/uL — AB (ref 0.1–1.0)
MONOS PCT: 11 % (ref 3–12)
Neutro Abs: 16.7 10*3/uL — ABNORMAL HIGH (ref 1.7–7.7)
Neutrophils Relative %: 87 % — ABNORMAL HIGH (ref 43–77)
Platelets: 149 10*3/uL — ABNORMAL LOW (ref 150–400)
RBC: 4.41 MIL/uL (ref 4.22–5.81)
RDW: 14.2 % (ref 11.5–15.5)
WBC: 19.2 10*3/uL — ABNORMAL HIGH (ref 4.0–10.5)

## 2014-09-02 LAB — LIPASE, BLOOD: LIPASE: 145 U/L — AB (ref 11–59)

## 2014-09-02 LAB — BLOOD GAS, VENOUS
Acid-Base Excess: 1.5 mmol/L (ref 0.0–2.0)
BICARBONATE: 24.4 meq/L — AB (ref 20.0–24.0)
O2 SAT: 87.2 %
PATIENT TEMPERATURE: 98.6
PH VEN: 7.461 — AB (ref 7.250–7.300)
PO2 VEN: 50.4 mmHg — AB (ref 30.0–45.0)
TCO2: 21.2 mmol/L (ref 0–100)
pCO2, Ven: 34.7 mmHg — ABNORMAL LOW (ref 45.0–50.0)

## 2014-09-02 LAB — I-STAT TROPONIN, ED: Troponin i, poc: 0 ng/mL (ref 0.00–0.08)

## 2014-09-02 LAB — I-STAT CG4 LACTIC ACID, ED: LACTIC ACID, VENOUS: 1.85 mmol/L (ref 0.5–2.2)

## 2014-09-02 LAB — PRO B NATRIURETIC PEPTIDE: PRO B NATRI PEPTIDE: 800.4 pg/mL — AB (ref 0–450)

## 2014-09-02 MED ORDER — MORPHINE SULFATE 2 MG/ML IJ SOLN
2.0000 mg | INTRAMUSCULAR | Status: DC | PRN
  Administered 2014-09-02 – 2014-09-03 (×2): 2 mg via INTRAVENOUS
  Filled 2014-09-02 (×2): qty 1

## 2014-09-02 MED ORDER — IOHEXOL 300 MG/ML  SOLN
100.0000 mL | Freq: Once | INTRAMUSCULAR | Status: AC | PRN
  Administered 2014-09-02: 100 mL via INTRAVENOUS

## 2014-09-02 MED ORDER — HYDROMORPHONE HCL 1 MG/ML IJ SOLN
1.0000 mg | Freq: Once | INTRAMUSCULAR | Status: AC
  Administered 2014-09-02: 1 mg via INTRAVENOUS
  Filled 2014-09-02: qty 1

## 2014-09-02 MED ORDER — OXYCODONE HCL 5 MG PO TABS
5.0000 mg | ORAL_TABLET | ORAL | Status: DC | PRN
  Administered 2014-09-03 (×2): 5 mg via ORAL
  Filled 2014-09-02 (×2): qty 1

## 2014-09-02 MED ORDER — PIPERACILLIN-TAZOBACTAM 3.375 G IVPB 30 MIN
3.3750 g | Freq: Once | INTRAVENOUS | Status: AC
  Administered 2014-09-02: 3.375 g via INTRAVENOUS
  Filled 2014-09-02: qty 50

## 2014-09-02 MED ORDER — PIPERACILLIN-TAZOBACTAM 3.375 G IVPB
3.3750 g | Freq: Three times a day (TID) | INTRAVENOUS | Status: DC
  Administered 2014-09-03 – 2014-09-04 (×5): 3.375 g via INTRAVENOUS
  Filled 2014-09-02 (×7): qty 50

## 2014-09-02 MED ORDER — ACETAMINOPHEN 325 MG PO TABS
650.0000 mg | ORAL_TABLET | Freq: Four times a day (QID) | ORAL | Status: DC | PRN
  Administered 2014-09-04: 650 mg via ORAL
  Filled 2014-09-02: qty 2

## 2014-09-02 MED ORDER — ACETAMINOPHEN 650 MG RE SUPP: 650.0000 mg | Freq: Four times a day (QID) | RECTAL | Status: DC | PRN

## 2014-09-02 MED ORDER — SODIUM CHLORIDE 0.9 % IV BOLUS (SEPSIS)
1000.0000 mL | Freq: Once | INTRAVENOUS | Status: AC
  Administered 2014-09-02: 1000 mL via INTRAVENOUS

## 2014-09-02 MED ORDER — SODIUM CHLORIDE 0.9 % IV SOLN
INTRAVENOUS | Status: AC
  Administered 2014-09-03 (×2): via INTRAVENOUS

## 2014-09-02 MED ORDER — VANCOMYCIN HCL 10 G IV SOLR
2000.0000 mg | Freq: Once | INTRAVENOUS | Status: AC
  Administered 2014-09-02: 2000 mg via INTRAVENOUS
  Filled 2014-09-02: qty 2000

## 2014-09-02 MED ORDER — DOXAZOSIN MESYLATE 4 MG PO TABS
4.0000 mg | ORAL_TABLET | Freq: Every day | ORAL | Status: DC
  Administered 2014-09-03 – 2014-09-08 (×6): 4 mg via ORAL
  Filled 2014-09-02 (×6): qty 1

## 2014-09-02 MED ORDER — NALOXONE HCL 1 MG/ML IJ SOLN
INTRAMUSCULAR | Status: AC
  Filled 2014-09-02: qty 2

## 2014-09-02 MED ORDER — ONDANSETRON HCL 4 MG/2ML IJ SOLN: 4.0000 mg | Freq: Four times a day (QID) | INTRAMUSCULAR | Status: DC | PRN

## 2014-09-02 MED ORDER — METOPROLOL SUCCINATE ER 25 MG PO TB24
25.0000 mg | ORAL_TABLET | Freq: Every morning | ORAL | Status: DC
  Administered 2014-09-03: 25 mg via ORAL
  Filled 2014-09-02 (×2): qty 1

## 2014-09-02 MED ORDER — ONDANSETRON HCL 4 MG PO TABS
4.0000 mg | ORAL_TABLET | Freq: Four times a day (QID) | ORAL | Status: DC | PRN
Start: 2014-09-02 — End: 2014-09-08

## 2014-09-02 MED ORDER — ACETAMINOPHEN 650 MG RE SUPP
650.0000 mg | Freq: Once | RECTAL | Status: AC
  Administered 2014-09-02: 650 mg via RECTAL
  Filled 2014-09-02: qty 1

## 2014-09-02 MED ORDER — SODIUM CHLORIDE 0.9 % IJ SOLN
3.0000 mL | Freq: Two times a day (BID) | INTRAMUSCULAR | Status: DC
  Administered 2014-09-03 – 2014-09-07 (×8): 3 mL via INTRAVENOUS

## 2014-09-02 NOTE — ED Notes (Signed)
Pt's son states that pt had a colonoscopy yesterday.  Began having RLQ pain since he began doing the colonoscopy prep.  States that he related this to gagging from the colonoscopy prep.  Pt went to his PCP today.  Pt took labs, WBC and bilirubin was elevated.  Pt appears altered in triage.  Son states that dad is one of the most "sharp" people he knows and he is not acting normal.Pt's eyes rolling in head at times and this writer noticed brief periods of nystagmus.  C/o CP in triage.  EKG done.  Afib at rate of 145 on monitor.

## 2014-09-02 NOTE — ED Notes (Signed)
Patient transported to CT 

## 2014-09-02 NOTE — ED Provider Notes (Signed)
CSN: [id number]     Arrival date & time 09/02/14  1617 History   First MD Initiated Contact with Patient 09/02/14 1645     Chief Complaint  Patient presents with  . Abdominal Pain     (Consider location/radiation/quality/duration/timing/severity/associated sxs/prior Treatment) Patient is a 80 y.o. male presenting with abdominal pain.  Abdominal Pain Pain location:  RUQ Pain quality: aching   Pain radiates to:  Does not radiate Pain severity:  Severe Onset quality:  Gradual Duration:  1 day Timing:  Constant Progression:  Worsening Chronicity:  New Context: previous surgery (prior colonic mass sp resection)   Context: not trauma   Context comment:  Colonoscopy yesterday, pain onset prior to colonoscopy Relieved by:  Nothing Worsened by:  Palpation and movement Ineffective treatments:  None tried Associated symptoms: anorexia and chills   Associated symptoms: no fever     Past Medical History  Diagnosis Date  . Rosacea   . Gout     in 70's, "not in toe but everywhere else"  . Vitamin B12 deficiency   . Arthritis   . Neuromuscular disorder   . Peripheral neuropathy     both feet, can feel pain  . Hypertension   . Chronic anticoagulation   . Low back pain     when lying flat  . Anemia     hx of  . Swelling     both legs sometimes  . OSA (obstructive sleep apnea)     moderate with AHI 17.53/hr now on CPAP at 7cm H2O  . Chronic atrial fibrillation    Past Surgical History  Procedure Laterality Date  . Hemicolectomy  03/1996    with 3 lymph nodes  . Cardioversion      at least 5 times  . Appendectomy      1997  . Tumor removal Right 1961    back of leg  . Cataract extraction Bilateral   . Inguinal hernia repair Left 08/21/2013    Procedure:  LEFT INGUINAL HERNIA REPAIR WITH MESH;  Surgeon: Earnstine Regal, MD;  Location: [facility];  Service: General;  Laterality: Left;  . Insertion of mesh Left 08/21/2013    Procedure: INSERTION OF MESH;  Surgeon: Earnstine Regal,  MD;  Location: [facility];  Service: General;  Laterality: Left;  . Cardioversion N/A 10/24/2013    Procedure: CARDIOVERSION;  Surgeon: Sueanne Margarita, MD;  Location: [facility];  Service: Cardiovascular;  Laterality: N/A;  . Inguinal hernia repair Right 01/29/2014    Procedure:  REPAIR RIGHT INGUNIAL HERNIA WITH MESH;  Surgeon: Earnstine Regal, MD;  Location: [facility];  Service: General;  Laterality: Right;   Family History  Problem Relation Age of Onset  . Stroke Mother 26  . Dementia Sister   . Cancer Father     lung   History  Substance Use Topics  . Smoking status: Former Smoker -- 2.00 packs/day    Types: Cigarettes    Quit date: 10/02/1974  . Smokeless tobacco: Never Used  . Alcohol Use: Yes     Comment: 1-2 beers per day    Review of Systems  Constitutional: Positive for chills. Negative for fever.  Gastrointestinal: Positive for abdominal pain and anorexia.  All other systems reviewed and are negative.     Allergies  Ace inhibitors and Clonidine derivatives  Home Medications   Prior to Admission medications   Medication Sig Start Date End Date Taking? Authorizing Provider  bimatoprost (LUMIGAN) 0.01 % SOLN Place 1  drop into both eyes at bedtime.    Yes Historical Provider, MD  bisacodyl (DULCOLAX) 5 MG EC tablet Take 5 mg by mouth. Take as directed.   Yes Historical Provider, MD  cyanocobalamin (,VITAMIN B-12,) 1000 MCG/ML injection Inject 1,000 mcg into the muscle every 30 (thirty) days.    Yes Historical Provider, MD  rivaroxaban (XARELTO) 20 MG TABS tablet Take 20 mg by mouth daily with supper.   Yes Historical Provider, MD  doxazosin (CARDURA) 4 MG tablet TAKE 1 TABLET BY MOUTH EVERY DAY    Traci R Turner, MD  fish oil-omega-3 fatty acids 1000 MG capsule Take 1 g by mouth 2 (two) times daily.     Historical Provider, MD  folic acid (FOLVITE) 1 MG tablet Take 1 mg by mouth daily.     Historical Provider, MD  furosemide (LASIX) 40 MG tablet TAKE 1 TABLET BY MOUTH EVERY  DAY AND EXTRA AS DIRECTED    Sueanne Margarita, MD  meloxicam (MOBIC) 15 MG tablet Take 15 mg by mouth daily as needed for pain.     Historical Provider, MD  metoprolol succinate (TOPROL-XL) 25 MG 24 hr tablet Take 25 mg by mouth every morning.    Historical Provider, MD  traMADol (ULTRAM) 50 MG tablet Take 50 mg by mouth every 4 (four) hours as needed for moderate pain or severe pain. Maximum dose= 8 tablets per day    Historical Provider, MD   BP 124/59 mmHg  Pulse 99  Temp(Src) 99.5 F (37.5 C) (Rectal)  Resp 17  SpO2 96% Physical Exam  Constitutional: He is oriented to person, place, and time. He appears well-developed and well-nourished.  HENT:  Head: Normocephalic and atraumatic.  Eyes: Conjunctivae and EOM are normal.  Neck: Normal range of motion. Neck supple.  Cardiovascular: Normal rate, regular rhythm and normal heart sounds.   Pulmonary/Chest: Effort normal and breath sounds normal. No respiratory distress.  Abdominal: He exhibits no distension. There is tenderness in the right upper quadrant. There is positive Murphy's sign. There is no rebound and no guarding.  Musculoskeletal: Normal range of motion.  Neurological: He is alert and oriented to person, place, and time.  Skin: Skin is warm and dry.  Vitals reviewed.   ED Course  Procedures (including critical care time) Labs Review Labs Reviewed  CBC WITH DIFFERENTIAL - Abnormal; Notable for the following:    WBC 19.2 (*)    Hemoglobin 12.6 (*)    HCT 37.5 (*)    Platelets 149 (*)    Neutrophils Relative % 87 (*)    Neutro Abs 16.7 (*)    Lymphocytes Relative 2 (*)    Lymphs Abs 0.5 (*)    Monocytes Absolute 2.0 (*)    All other components within normal limits  COMPREHENSIVE METABOLIC PANEL - Abnormal; Notable for the following:    Sodium 132 (*)    Chloride 92 (*)    Glucose, Bld 193 (*)    Albumin 2.9 (*)    AST 39 (*)    Total Bilirubin 4.0 (*)    GFR calc non Af Amer 64 (*)    GFR calc Af Amer 75 (*)     Anion gap 20 (*)    All other components within normal limits  LIPASE, BLOOD - Abnormal; Notable for the following:    Lipase 145 (*)    All other components within normal limits  URINALYSIS, ROUTINE W REFLEX MICROSCOPIC - Abnormal; Notable for the following:  Color, Urine ORANGE (*)    APPearance CLOUDY (*)    Specific Gravity, Urine >1.046 (*)    Hgb urine dipstick LARGE (*)    Bilirubin Urine LARGE (*)    Protein, ur 100 (*)    Urobilinogen, UA 4.0 (*)    Nitrite POSITIVE (*)    Leukocytes, UA SMALL (*)    All other components within normal limits  PRO B NATRIURETIC PEPTIDE - Abnormal; Notable for the following:    Pro B Natriuretic peptide (BNP) 800.4 (*)    All other components within normal limits  BLOOD GAS, VENOUS - Abnormal; Notable for the following:    pH, Ven 7.461 (*)    pCO2, Ven 34.7 (*)    pO2, Ven 50.4 (*)    Bicarbonate 24.4 (*)    All other components within normal limits  URINE MICROSCOPIC-ADD ON - Abnormal; Notable for the following:    Bacteria, UA MANY (*)    All other components within normal limits  CULTURE, BLOOD (ROUTINE X 2)  CULTURE, BLOOD (ROUTINE X 2)  MRSA PCR SCREENING  I-STAT TROPOININ, ED  I-STAT CG4 LACTIC ACID, ED    Imaging Review Dg Chest 2 View  09/02/2014   CLINICAL DATA:  Shortness of breath 1 day after colonoscopy. Right anterior chest pain and right lower rib pain.  EXAM: CHEST  2 VIEW  COMPARISON:  11/15/2013  FINDINGS: The heart is enlarged but stable. There is tortuosity, ectasia and calcification of the thoracic aorta. There are chronic bronchitic lung changes but no acute pulmonary findings. Low lung volumes with vascular crowding and streaky basilar atelectasis. No pleural effusion or pneumothorax. No free air is identified under the hemidiaphragms. The bony thorax is intact.  IMPRESSION: 1. Mild stable cardiac enlargement. 2. Low lung volumes with vascular crowding and streaky bibasilar atelectasis. 3. No pneumothorax or free  intra-abdominal air.   Electronically Signed   By: Kalman Jewels M.D.   On: 09/02/2014 12:09   Dg Ribs Unilateral Right  09/02/2014   CLINICAL DATA:  Shortness of breath for 1 day, RIGHT anterior chest and RIGHT lower rib pain post colonoscopy  EXAM: RIGHT RIBS - 2 VIEW  COMPARISON:  11/15/2013 chest radiograph  FINDINGS: BB placed at site of symptoms at lower lateral RIGHT chest.  Osseous mineralization normal.  No rib fracture or bone destruction.  No focal osseous abnormalities identified at site of clinical concern.  Scattered endplate spur formation thoracic spine.  IMPRESSION: No RIGHT rib abnormalities are radiographically identified.   Electronically Signed   By: Lavonia Dana M.D.   On: 09/02/2014 12:05   Ct Head Wo Contrast  09/02/2014   CLINICAL DATA:  Altered mental status after colonoscopy 1 day ago, possibly related to anesthesia, now worsening.  EXAM: CT HEAD WITHOUT CONTRAST  TECHNIQUE: Contiguous axial images were obtained from the base of the skull through the vertex without intravenous contrast.  COMPARISON:  CT of the head June 16, 2009  FINDINGS: The ventricles and sulci are normal for age. No intraparenchymal hemorrhage, mass effect nor midline shift. Patchy supratentorial white matter hypodensities are less than expected for patient's age and though non-specific suggest sequelae of chronic small vessel ischemic disease. No acute large vascular territory infarcts.  No abnormal extra-axial fluid collections. Basal cisterns are patent. Mild calcific atherosclerosis of the carotid siphons.  No skull fracture. The included ocular globes and orbital contents are non-suspicious. Status post bilateral ocular lens implants. The mastoid aircells and included paranasal sinuses are  well-aerated. Patient is edentulous. Mild temporomandibular osteoarthrosis.  IMPRESSION: No acute intracranial process ; normal noncontrast CT of the head for age.   Electronically Signed   By: Elon Alas    On: 09/02/2014 21:00   Ct Abdomen Pelvis W Contrast  09/02/2014   CLINICAL DATA:  80 year old male with right abdominal and pelvic pain following colonoscopy yesterday. History of right hemicolectomy for polyp. Initial encounter.  EXAM: CT ABDOMEN AND PELVIS WITH CONTRAST  TECHNIQUE: Multidetector CT imaging of the abdomen and pelvis was performed using the standard protocol following bolus administration of intravenous contrast.  CONTRAST:  125mL OMNIPAQUE IOHEXOL 300 MG/ML  SOLN  COMPARISON:  None.  FINDINGS: Mild bibasilar atelectasis and cardiomegaly identified.  The gallbladder is distended with wall thickening and adjacent inflammation compatible with acute cholecystitis. A possible stone in the cystic duct is identified.  The liver, spleen, adrenal glands, kidneys, and pancreas are unremarkable.  There is no evidence of enlarged lymph nodes, biliary dilatation or abdominal aortic aneurysm.  A small amount of free fluid in the pelvis is present.  Evidence of right hemicolectomy noted. The bowel is otherwise unremarkable. There is no evidence of bowel obstruction, abscess or pneumoperitoneum.  The bladder is unremarkable.  No acute or suspicious bony abnormalities are identified.  Degenerative changes in the lumbar spine again identified.  IMPRESSION: Acute cholecystitis without biliary dilatation. Equivocal stone in the cystic duct identified.  Small amount of free pelvic fluid.  Cardiomegaly and mild bibasilar atelectasis.   Electronically Signed   By: Hassan Rowan M.D.   On: 09/02/2014 21:04   Dg Abd Acute W/chest  09/02/2014   CLINICAL DATA:  Acute epigastric and right-sided abdominal pain today. Shortness of breath.  EXAM: ACUTE ABDOMEN SERIES (ABDOMEN 2 VIEW & CHEST 1 VIEW)  COMPARISON:  Chest x-ray 09/02/2014  FINDINGS: The upright chest x-ray demonstrates stable cardiac enlargement. The mediastinal and hilar contours are stable. Low lung volumes with vascular crowding and atelectasis. Mild vascular  congestion without overt pulmonary edema.  Two views of the abdomen demonstrate scattered air in the colon. There are air-filled small bowel loops air-fluid levels but no significant distention. Could not exclude an early or partial small bowel obstruction. No free air. The soft tissue shadows are maintained. The bony structures are intact.  IMPRESSION: 1. Stable cardiac enlargement. 2. Low lung volumes with vascular crowding, vascular congestion and bibasilar atelectasis. 3. Possible early or partial small bowel obstruction.   Electronically Signed   By: Kalman Jewels M.D.   On: 09/02/2014 18:00     EKG Interpretation   Date/Time:  Wednesday September 02 2014 16:35:23 EST Ventricular Rate:  141 PR Interval:    QRS Duration: 81 QT Interval:  298 QTC Calculation: 456 R Axis:   6 Text Interpretation:  Atrial fibrillation ST depression, probably rate  related Baseline wander in lead(s) I III aVL No significant change since  last tracing Confirmed by Debby Freiberg [phone]) on 09/02/2014 4:45:47 PM      CRITICAL CARE Performed by: Debby Freiberg   Total critical care time: 35 minutes  Critical care time was exclusive of separately billable procedures and treating other patients.  Critical care was necessary to treat or prevent imminent or life-threatening deterioration.  Critical care was time spent personally by me on the following activities: development of treatment plan with patient and/or surrogate as well as nursing, discussions with consultants, evaluation of patient's response to treatment, examination of patient, obtaining history from patient or surrogate, ordering  and performing treatments and interventions, ordering and review of laboratory studies, ordering and review of radiographic studies, pulse oximetry and re-evaluation of patient's condition.  MDM   Final diagnoses:  Abdominal pain  Altered mental status    80 y.o. male with pertinent PMH of afib, OSA, anemia,  colonoscopy yesterday presents with chest and abdominal pain since yesterday. Pain began while patient was taking prep after a gagging episode. He was able to hide the symptoms enough that colonoscopy continued. He presents today as his symptoms have worsened and his son feels that the patient is been intermittently confused. Patient describes sharp chest pain on the right side substernally. He also has right upper quadrant abdominal pain. CT scan as above demonstrated acute cholecystitis.  Pt given zosyn. The patient was given diltiazem for A. fib with RVR.  Consulted surgery who requested medical admission.  Admitted to stepdown.    1. Abdominal pain   2. Altered mental status         Debby Freiberg, MD 09/03/14 [phone]

## 2014-09-02 NOTE — Progress Notes (Signed)
Patient ID: Cameron [name], male   DOB: 09-Jul-1934, 80 y.o.   MRN: [id number]   We were consulted to see this patient while I was in the operating room with an emergency case.  After the surgery, I found out that the ED was on lockdown and I could not enter the ED.  I reviewed his chart in [facility].  He has signs of acute cholecystitis with probable common bile duct obstruction (bilirubin 4).  He is also on Xarelto.  The [facility] team will leave a formal consult tomorrow after we are able to evaluate him, but recommend NPO, IV abx, repeat LFT's in AM.  Consult GI if bilirubin still elevated.  Hold Xarelto.  It will be a few days before he is able to undergo surgery because of anticoagulation.  Imogene Burn. Georgette Dover, MD, [facility] Surgery  General/ Trauma Surgery  09/02/2014 11:02 PM

## 2014-09-02 NOTE — Progress Notes (Signed)
ANTIBIOTIC CONSULT NOTE - INITIAL  Pharmacy Consult for Zosyn Indication: Intra-abdominal infection  Allergies  Allergen Reactions  . Ace Inhibitors     Lips inflated to about "20 pound"  . Clonidine Derivatives Rash    Patient Measurements:   Wt=94 kg  Vital Signs: Temp: 99.5 F (37.5 C) (12/02 2055) Temp Source: Rectal (12/02 2055) BP: 121/72 mmHg (12/02 2230) Pulse Rate: 113 (12/02 2215) Intake/Output from previous day:   Intake/Output from this shift:    Labs:  Recent Labs  09/02/14 1706  WBC 19.2*  HGB 12.6*  PLT 149*  CREATININE 1.07   Estimated Creatinine Clearance: 65.6 mL/min (by C-G formula based on Cr of 1.07). No results for input(s): VANCOTROUGH, VANCOPEAK, VANCORANDOM, GENTTROUGH, GENTPEAK, GENTRANDOM, TOBRATROUGH, TOBRAPEAK, TOBRARND, AMIKACINPEAK, AMIKACINTROU, AMIKACIN in the last 72 hours.   Microbiology: No results found for this or any previous visit (from the past 720 hour(s)).  Medical History: Past Medical History  Diagnosis Date  . Rosacea   . Gout     in 70's, "not in toe but everywhere else"  . Vitamin B12 deficiency   . Arthritis   . Neuromuscular disorder   . Peripheral neuropathy     both feet, can feel pain  . Hypertension   . Chronic anticoagulation   . Low back pain     when lying flat  . Anemia     hx of  . Swelling     both legs sometimes  . OSA (obstructive sleep apnea)     moderate with AHI 17.53/hr now on CPAP at 7cm H2O  . Chronic atrial fibrillation     Medications:  Scheduled:  . [START ON 09/03/2014] doxazosin  4 mg Oral Daily  . [START ON 09/03/2014] metoprolol succinate  25 mg Oral q morning - 10a  . sodium chloride  3 mL Intravenous Q12H   Infusions:  . sodium chloride     Assessment: 65 yoM with acute cholecystitis.  Zosyn per Rx.   Goal of Therapy:  Treat infection  Plan:   Zosyn 3.375 Gm IV q8h EI  F/u SCr/cultures as needed  Lawana Pai R 09/02/2014,11:24 PM

## 2014-09-03 ENCOUNTER — Inpatient Hospital Stay (HOSPITAL_COMMUNITY): Payer: Medicare Other

## 2014-09-03 ENCOUNTER — Encounter (HOSPITAL_COMMUNITY): Payer: Self-pay | Admitting: *Deleted

## 2014-09-03 DIAGNOSIS — R799 Abnormal finding of blood chemistry, unspecified: Secondary | ICD-10-CM

## 2014-09-03 DIAGNOSIS — K8050 Calculus of bile duct without cholangitis or cholecystitis without obstruction: Secondary | ICD-10-CM | POA: Diagnosis present

## 2014-09-03 DIAGNOSIS — K8020 Calculus of gallbladder without cholecystitis without obstruction: Secondary | ICD-10-CM | POA: Diagnosis present

## 2014-09-03 DIAGNOSIS — R739 Hyperglycemia, unspecified: Secondary | ICD-10-CM | POA: Diagnosis present

## 2014-09-03 DIAGNOSIS — R7989 Other specified abnormal findings of blood chemistry: Secondary | ICD-10-CM | POA: Diagnosis present

## 2014-09-03 DIAGNOSIS — F05 Delirium due to known physiological condition: Secondary | ICD-10-CM

## 2014-09-03 DIAGNOSIS — R748 Abnormal levels of other serum enzymes: Secondary | ICD-10-CM | POA: Diagnosis present

## 2014-09-03 DIAGNOSIS — R17 Unspecified jaundice: Secondary | ICD-10-CM

## 2014-09-03 LAB — COMPREHENSIVE METABOLIC PANEL
ALBUMIN: 2.7 g/dL — AB (ref 3.5–5.2)
ALT: 26 U/L (ref 0–53)
AST: 32 U/L (ref 0–37)
Alkaline Phosphatase: 92 U/L (ref 39–117)
Anion gap: 17 — ABNORMAL HIGH (ref 5–15)
BILIRUBIN TOTAL: 4 mg/dL — AB (ref 0.3–1.2)
BUN: 18 mg/dL (ref 6–23)
CO2: 22 mEq/L (ref 19–32)
CREATININE: 1.17 mg/dL (ref 0.50–1.35)
Calcium: 8.9 mg/dL (ref 8.4–10.5)
Chloride: 96 mEq/L (ref 96–112)
GFR calc Af Amer: 67 mL/min — ABNORMAL LOW (ref >90)
GFR calc non Af Amer: 58 mL/min — ABNORMAL LOW (ref >90)
Glucose, Bld: 135 mg/dL — ABNORMAL HIGH (ref 70–99)
Potassium: 4 mEq/L (ref 3.7–5.3)
Sodium: 135 mEq/L — ABNORMAL LOW (ref 137–147)
TOTAL PROTEIN: 7 g/dL (ref 6.0–8.3)

## 2014-09-03 LAB — HEMOGLOBIN A1C
Hgb A1c MFr Bld: 6 % — ABNORMAL HIGH (ref <5.7)
MEAN PLASMA GLUCOSE: 126 mg/dL — AB (ref <117)

## 2014-09-03 LAB — CBC
HCT: 36.7 % — ABNORMAL LOW (ref 39.0–52.0)
Hemoglobin: 12.6 g/dL — ABNORMAL LOW (ref 13.0–17.0)
MCH: 29.2 pg (ref 26.0–34.0)
MCHC: 34.3 g/dL (ref 30.0–36.0)
MCV: 85 fL (ref 78.0–100.0)
PLATELETS: 147 10*3/uL — AB (ref 150–400)
RBC: 4.32 MIL/uL (ref 4.22–5.81)
RDW: 14.3 % (ref 11.5–15.5)
WBC: 21 10*3/uL — ABNORMAL HIGH (ref 4.0–10.5)

## 2014-09-03 LAB — APTT: APTT: 48 s — AB (ref 24–37)

## 2014-09-03 LAB — PROTIME-INR
INR: 2.85 — ABNORMAL HIGH (ref 0.00–1.49)
PROTHROMBIN TIME: 30.1 s — AB (ref 11.6–15.2)

## 2014-09-03 LAB — LIPASE, BLOOD: LIPASE: 40 U/L (ref 11–59)

## 2014-09-03 LAB — MRSA PCR SCREENING: MRSA BY PCR: INVALID — AB

## 2014-09-03 MED ORDER — HYDROMORPHONE HCL 1 MG/ML IJ SOLN
0.5000 mg | INTRAMUSCULAR | Status: DC | PRN
  Administered 2014-09-03: 0.5 mg via INTRAVENOUS
  Filled 2014-09-03: qty 1

## 2014-09-03 MED ORDER — DIPHENHYDRAMINE HCL 50 MG/ML IJ SOLN
12.5000 mg | Freq: Four times a day (QID) | INTRAMUSCULAR | Status: DC | PRN
  Administered 2014-09-03 (×2): 12.5 mg via INTRAVENOUS
  Filled 2014-09-03 (×2): qty 1

## 2014-09-03 MED ORDER — HYDROMORPHONE HCL 1 MG/ML IJ SOLN
1.0000 mg | Freq: Once | INTRAMUSCULAR | Status: AC
  Administered 2014-09-03: 1 mg via INTRAVENOUS

## 2014-09-03 MED ORDER — HYDROMORPHONE HCL 1 MG/ML IJ SOLN
0.5000 mg | INTRAMUSCULAR | Status: DC | PRN
Start: 2014-09-03 — End: 2014-09-04
  Administered 2014-09-03 – 2014-09-04 (×3): 1 mg via INTRAVENOUS
  Filled 2014-09-03 (×4): qty 1

## 2014-09-03 MED ORDER — HYDROMORPHONE HCL 1 MG/ML IJ SOLN
INTRAMUSCULAR | Status: AC
  Filled 2014-09-03: qty 1

## 2014-09-03 MED ORDER — ACETAMINOPHEN 10 MG/ML IV SOLN
1000.0000 mg | Freq: Four times a day (QID) | INTRAVENOUS | Status: AC
  Administered 2014-09-03 – 2014-09-04 (×4): 1000 mg via INTRAVENOUS
  Filled 2014-09-03 (×4): qty 100

## 2014-09-03 MED ORDER — METOPROLOL TARTRATE 1 MG/ML IV SOLN
2.5000 mg | INTRAVENOUS | Status: DC | PRN
  Administered 2014-09-03 – 2014-09-04 (×2): 2.5 mg via INTRAVENOUS
  Filled 2014-09-03 (×3): qty 5

## 2014-09-03 MED ORDER — LEVALBUTEROL HCL 0.63 MG/3ML IN NEBU: 0.6300 mg | INHALATION_SOLUTION | Freq: Four times a day (QID) | RESPIRATORY_TRACT | Status: DC | PRN

## 2014-09-03 NOTE — Progress Notes (Signed)
Pt c/o abd pain. Pt next pain medication not due until 547am. Pt has afib and hr when in pain goes up to 150's. Paged midlevel awaiting call back.

## 2014-09-03 NOTE — Plan of Care (Signed)
Problem: Phase I Progression Outcomes Goal: Voiding-avoid urinary catheter unless indicated Outcome: Completed/Met Date Met:  09/03/14

## 2014-09-03 NOTE — Consult Note (Signed)
Reason for Consult:cholecystitis/choledocholithiasis Referring Physician: Dr. Berle Mull GI:  Dr. Clarene Essex Cardiology:  Dr. Fransico Him  Cameron [name] is an 80 y.o. male.  HPI: 81 y/o admitted thru the ED with RUQ and confusion,  pain which started after starting colonoscopy prep.  He had the colonoscopy yesterday but continued to have pain after the procedure, with worsening pain and confusion.  He cannot remember any of the timings of his symptoms this Am, but it sounds like he has been sick since last weekend. ED evaluation shows temp of 102, with tachycardia and hypertension on admit.  Total  bilirubin of 4.0.  Lipase of 145, WBC of 19.2, BNP of 800, possible UTI.  3 way film shows possible SBO, no free air. Ct of abdomen/pelvis shows the gallbladder is distended with wall thickening and adjacent inflammation compatible with acute cholecystitis. A possible stone in the cystic duct is identified.  The liver, spleen, adrenal glands, kidneys, and pancreas are unremarkable. There is no evidence of enlarged lymph nodes, biliary dilatation.  We were ask to see last PM, but Dr. Gershon Crane could not get in because of a lock down.  He is admitted by Medicine and we are ask to see, he has been started on Zosyn. CT head was unremarkable.  Past Medical History  Diagnosis Date  Chronic atrial fibrillation on chronic anitcoagulation   Chronic diastolic heart failure   OSA (obstructive sleep apnea)    moderate with AHI 17.53/hr now on CPAP at 7cm H2O     Hypertension   Peripheral neuropathy    both feet, can feel pain     Neuromuscular disorder   Gout    in 70's, "not in toe but everywhere else"  Vitamin B12 deficiency   Arthritis   Neuromuscular disorder   Low back pain    when lying flat  Anemia    hx of  Swelling    both legs sometimes  Rosacea     Past Surgical History  Procedure Laterality Date  . Hemicolectomy  03/1996    with 3 lymph nodes  . Cardioversion      at least 5 times   . Appendectomy      1997  . Tumor removal Right 1961    back of leg  . Cataract extraction Bilateral   . Inguinal hernia repair Left 08/21/2013    Procedure:  LEFT INGUINAL HERNIA REPAIR WITH MESH;  Surgeon: Earnstine Regal, MD;  Location: [facility];  Service: General;  Laterality: Left;  . Insertion of mesh Left 08/21/2013    Procedure: INSERTION OF MESH;  Surgeon: Earnstine Regal, MD;  Location: [facility];  Service: General;  Laterality: Left;  . Cardioversion N/A 10/24/2013    Procedure: CARDIOVERSION;  Surgeon: Sueanne Margarita, MD;  Location: [facility];  Service: Cardiovascular;  Laterality: N/A;  . Inguinal hernia repair Right 01/29/2014    Procedure:  REPAIR RIGHT INGUNIAL HERNIA WITH MESH;  Surgeon: Earnstine Regal, MD;  Location: [facility];  Service: General;  Laterality: Right;    Family History  Problem Relation Age of Onset  . Stroke Mother 101  . Dementia Sister   . Cancer Father     lung    Social History:  reports that he quit smoking about 39 years ago. His smoking use included Cigarettes. He smoked 2.00 packs per day. He has never used smokeless tobacco. He reports that he drinks alcohol. He reports that he does not use illicit drugs.  Allergies:  Allergies  Allergen Reactions  . Ace Inhibitors     Lips inflated to about "20 pound"  . Clonidine Derivatives Rash    Medications:  Prior to Admission:  Prescriptions prior to admission  Medication Sig Dispense Refill Last Dose  . bimatoprost (LUMIGAN) 0.01 % SOLN Place 1 drop into both eyes at bedtime.    09/01/2014 at Unknown time  . bisacodyl (DULCOLAX) 5 MG EC tablet Take 5 mg by mouth. Take as directed.     . cyanocobalamin (,VITAMIN B-12,) 1000 MCG/ML injection Inject 1,000 mcg into the muscle every 30 (thirty) days.    Past Month at Unknown time  . rivaroxaban (XARELTO) 20 MG TABS tablet Take 20 mg by mouth daily with supper.   09/02/2014 at 2400  . doxazosin (CARDURA) 4 MG tablet TAKE 1 TABLET BY MOUTH EVERY DAY 90 tablet 1  Taking  . fish oil-omega-3 fatty acids 1000 MG capsule Take 1 g by mouth 2 (two) times daily.    Taking  . folic acid (FOLVITE) 1 MG tablet Take 1 mg by mouth daily.    Taking  . furosemide (LASIX) 40 MG tablet TAKE 1 TABLET BY MOUTH EVERY DAY AND EXTRA AS DIRECTED 100 tablet 1 Taking  . meloxicam (MOBIC) 15 MG tablet Take 15 mg by mouth daily as needed for pain.    Taking  . metoprolol succinate (TOPROL-XL) 25 MG 24 hr tablet Take 25 mg by mouth every morning.   Taking  . traMADol (ULTRAM) 50 MG tablet Take 50 mg by mouth every 4 (four) hours as needed for moderate pain or severe pain. Maximum dose= 8 tablets per day   Not Taking   Scheduled: . doxazosin  4 mg Oral Daily  . metoprolol succinate  25 mg Oral q morning - 10a  . naloxone      . piperacillin-tazobactam (ZOSYN)  IV  3.375 g Intravenous Q8H  . sodium chloride  3 mL Intravenous Q12H   Continuous: . sodium chloride 50 mL/hr at 09/03/14 0026   QIH:[id number] **OR** acetaminophen, HYDROmorphone (DILAUDID) injection, metoprolol, ondansetron **OR** ondansetron (ZOFRAN) IV, oxyCODONE Anti-infectives    Start     Dose/Rate Route Frequency Ordered Stop   09/03/14 0200  piperacillin-tazobactam (ZOSYN) IVPB 3.375 g     3.375 g12.5 mL/hr over 240 Minutes Intravenous Every 8 hours 09/02/14 2327     09/02/14 1700  vancomycin (VANCOCIN) 2,000 mg in sodium chloride 0.9 % 500 mL IVPB     2,000 mg250 mL/hr over 120 Minutes Intravenous  Once 09/02/14 1658 09/02/14 2002   09/02/14 1700  piperacillin-tazobactam (ZOSYN) IVPB 3.375 g     3.375 g100 mL/hr over 30 Minutes Intravenous  Once 09/02/14 1658 09/02/14 1803      Results for orders placed or performed during the hospital encounter of 09/02/14 (from the past 48 hour(s))  CBC with Differential     Status: Abnormal   Collection Time: 09/02/14  5:06 PM  Result Value Ref Range   WBC 19.2 (H) 4.0 - 10.5 K/uL   RBC 4.41 4.22 - 5.81 MIL/uL   Hemoglobin 12.6 (L) 13.0 - 17.0 g/dL   HCT 37.5  (L) 39.0 - 52.0 %   MCV 85.0 78.0 - 100.0 fL   MCH 28.6 26.0 - 34.0 pg   MCHC 33.6 30.0 - 36.0 g/dL   RDW 14.2 11.5 - 15.5 %   Platelets 149 (L) 150 - 400 K/uL   Neutrophils Relative % 87 (H) 43 - 77 %  Neutro Abs 16.7 (H) 1.7 - 7.7 K/uL   Lymphocytes Relative 2 (L) 12 - 46 %   Lymphs Abs 0.5 (L) 0.7 - 4.0 K/uL   Monocytes Relative 11 3 - 12 %   Monocytes Absolute 2.0 (H) 0.1 - 1.0 K/uL   Eosinophils Relative 0 0 - 5 %   Eosinophils Absolute 0.0 0.0 - 0.7 K/uL   Basophils Relative 0 0 - 1 %   Basophils Absolute 0.0 0.0 - 0.1 K/uL  Comprehensive metabolic panel     Status: Abnormal   Collection Time: 09/02/14  5:06 PM  Result Value Ref Range   Sodium 132 (L) 137 - 147 mEq/L   Potassium 3.7 3.7 - 5.3 mEq/L   Chloride 92 (L) 96 - 112 mEq/L   CO2 20 19 - 32 mEq/L   Glucose, Bld 193 (H) 70 - 99 mg/dL   BUN 14 6 - 23 mg/dL   Creatinine, Ser 1.07 0.50 - 1.35 mg/dL   Calcium 8.9 8.4 - 10.5 mg/dL   Total Protein 7.2 6.0 - 8.3 g/dL   Albumin 2.9 (L) 3.5 - 5.2 g/dL   AST 39 (H) 0 - 37 U/L   ALT 26 0 - 53 U/L   Alkaline Phosphatase 94 39 - 117 U/L   Total Bilirubin 4.0 (H) 0.3 - 1.2 mg/dL   GFR calc non Af Amer 64 (L) >90 mL/min   GFR calc Af Amer 75 (L) >90 mL/min    Comment: (NOTE) The eGFR has been calculated using the CKD EPI equation. This calculation has not been validated in all clinical situations. eGFR's persistently <90 mL/min signify possible Chronic Kidney Disease.    Anion gap 20 (H) 5 - 15  Lipase, blood     Status: Abnormal   Collection Time: 09/02/14  5:06 PM  Result Value Ref Range   Lipase 145 (H) 11 - 59 U/L  Pro b natriuretic peptide (BNP)     Status: Abnormal   Collection Time: 09/02/14  5:06 PM  Result Value Ref Range   Pro B Natriuretic peptide (BNP) 800.4 (H) 0 - 450 pg/mL  Blood gas, venous     Status: Abnormal   Collection Time: 09/02/14  5:06 PM  Result Value Ref Range   pH, Ven 7.461 (H) 7.250 - 7.300   pCO2, Ven 34.7 (L) 45.0 - 50.0 mmHg   pO2,  Ven 50.4 (H) 30.0 - 45.0 mmHg   Bicarbonate 24.4 (H) 20.0 - 24.0 mEq/L   TCO2 21.2 0 - 100 mmol/L   Acid-Base Excess 1.5 0.0 - 2.0 mmol/L   O2 Saturation 87.2 %   Patient temperature 98.6    Collection site COLLECTED BY NURSE    Drawn by COLLECTED BY NURSE    Sample type VEIN   I-stat troponin, ED     Status: None   Collection Time: 09/02/14  5:14 PM  Result Value Ref Range   Troponin i, poc 0.00 0.00 - 0.08 ng/mL   Comment 3            Comment: Due to the release kinetics of cTnI, a negative result within the first hours of the onset of symptoms does not rule out myocardial infarction with certainty. If myocardial infarction is still suspected, repeat the test at appropriate intervals.   I-Stat CG4 Lactic Acid, ED     Status: None   Collection Time: 09/02/14  5:17 PM  Result Value Ref Range   Lactic Acid, Venous 1.85 0.5 - 2.2 mmol/L  Urinalysis, Routine w reflex microscopic     Status: Abnormal   Collection Time: 09/02/14  9:00 PM  Result Value Ref Range   Color, Urine ORANGE (A) YELLOW    Comment: BIOCHEMICALS MAY BE AFFECTED BY COLOR   APPearance CLOUDY (A) CLEAR   Specific Gravity, Urine >1.046 (H) 1.005 - 1.030   pH 6.0 5.0 - 8.0   Glucose, UA NEGATIVE NEGATIVE mg/dL   Hgb urine dipstick LARGE (A) NEGATIVE   Bilirubin Urine LARGE (A) NEGATIVE   Ketones, ur NEGATIVE NEGATIVE mg/dL   Protein, ur 100 (A) NEGATIVE mg/dL   Urobilinogen, UA 4.0 (H) 0.0 - 1.0 mg/dL   Nitrite POSITIVE (A) NEGATIVE   Leukocytes, UA SMALL (A) NEGATIVE  Urine microscopic-add on     Status: Abnormal   Collection Time: 09/02/14  9:00 PM  Result Value Ref Range   Squamous Epithelial / LPF RARE RARE   WBC, UA 3-6 <3 WBC/hpf   RBC / HPF 3-6 <3 RBC/hpf   Bacteria, UA MANY (A) RARE   Urine-Other AMORPHOUS URATES/PHOSPHATES   MRSA PCR Screening     Status: Abnormal   Collection Time: 09/03/14 12:27 AM  Result Value Ref Range   MRSA by PCR INVALID RESULTS, SPECIMEN SENT FOR CULTURE (A) NEGATIVE     Comment: SPOKE WITH CROFPS,S RN [phone] [place],N        The GeneXpert MRSA Assay (FDA approved for NASAL specimens only), is one component of a comprehensive MRSA colonization surveillance program. It is not intended to diagnose MRSA infection nor to guide or monitor treatment for MRSA infections.     Dg Chest 2 View  09/02/2014   CLINICAL DATA:  Shortness of breath 1 day after colonoscopy. Right anterior chest pain and right lower rib pain.  EXAM: CHEST  2 VIEW  COMPARISON:  11/15/2013  FINDINGS: The heart is enlarged but stable. There is tortuosity, ectasia and calcification of the thoracic aorta. There are chronic bronchitic lung changes but no acute pulmonary findings. Low lung volumes with vascular crowding and streaky basilar atelectasis. No pleural effusion or pneumothorax. No free air is identified under the hemidiaphragms. The bony thorax is intact.  IMPRESSION: 1. Mild stable cardiac enlargement. 2. Low lung volumes with vascular crowding and streaky bibasilar atelectasis. 3. No pneumothorax or free intra-abdominal air.   Electronically Signed   By: Kalman Jewels M.D.   On: 09/02/2014 12:09   Dg Ribs Unilateral Right  09/02/2014   CLINICAL DATA:  Shortness of breath for 1 day, RIGHT anterior chest and RIGHT lower rib pain post colonoscopy  EXAM: RIGHT RIBS - 2 VIEW  COMPARISON:  11/15/2013 chest radiograph  FINDINGS: BB placed at site of symptoms at lower lateral RIGHT chest.  Osseous mineralization normal.  No rib fracture or bone destruction.  No focal osseous abnormalities identified at site of clinical concern.  Scattered endplate spur formation thoracic spine.  IMPRESSION: No RIGHT rib abnormalities are radiographically identified.   Electronically Signed   By: Lavonia Dana M.D.   On: 09/02/2014 12:05   Ct Head Wo Contrast  09/02/2014   CLINICAL DATA:  Altered mental status after colonoscopy 1 day ago, possibly related to anesthesia, now worsening.  EXAM: CT HEAD  WITHOUT CONTRAST  TECHNIQUE: Contiguous axial images were obtained from the base of the skull through the vertex without intravenous contrast.  COMPARISON:  CT of the head June 16, 2009  FINDINGS: The ventricles and sulci are normal for age. No intraparenchymal hemorrhage, mass effect  nor midline shift. Patchy supratentorial white matter hypodensities are less than expected for patient's age and though non-specific suggest sequelae of chronic small vessel ischemic disease. No acute large vascular territory infarcts.  No abnormal extra-axial fluid collections. Basal cisterns are patent. Mild calcific atherosclerosis of the carotid siphons.  No skull fracture. The included ocular globes and orbital contents are non-suspicious. Status post bilateral ocular lens implants. The mastoid aircells and included paranasal sinuses are well-aerated. Patient is edentulous. Mild temporomandibular osteoarthrosis.  IMPRESSION: No acute intracranial process ; normal noncontrast CT of the head for age.   Electronically Signed   By: Elon Alas   On: 09/02/2014 21:00   Ct Abdomen Pelvis W Contrast  09/02/2014   CLINICAL DATA:  80 year old male with right abdominal and pelvic pain following colonoscopy yesterday. History of right hemicolectomy for polyp. Initial encounter.  EXAM: CT ABDOMEN AND PELVIS WITH CONTRAST  TECHNIQUE: Multidetector CT imaging of the abdomen and pelvis was performed using the standard protocol following bolus administration of intravenous contrast.  CONTRAST:  115m OMNIPAQUE IOHEXOL 300 MG/ML  SOLN  COMPARISON:  None.  FINDINGS: Mild bibasilar atelectasis and cardiomegaly identified.  The gallbladder is distended with wall thickening and adjacent inflammation compatible with acute cholecystitis. A possible stone in the cystic duct is identified.  The liver, spleen, adrenal glands, kidneys, and pancreas are unremarkable.  There is no evidence of enlarged lymph nodes, biliary dilatation or abdominal  aortic aneurysm.  A small amount of free fluid in the pelvis is present.  Evidence of right hemicolectomy noted. The bowel is otherwise unremarkable. There is no evidence of bowel obstruction, abscess or pneumoperitoneum.  The bladder is unremarkable.  No acute or suspicious bony abnormalities are identified.  Degenerative changes in the lumbar spine again identified.  IMPRESSION: Acute cholecystitis without biliary dilatation. Equivocal stone in the cystic duct identified.  Small amount of free pelvic fluid.  Cardiomegaly and mild bibasilar atelectasis.   Electronically Signed   By: JHassan RowanM.D.   On: 09/02/2014 21:04   Dg Abd Acute W/chest  09/02/2014   CLINICAL DATA:  Acute epigastric and right-sided abdominal pain today. Shortness of breath.  EXAM: ACUTE ABDOMEN SERIES (ABDOMEN 2 VIEW & CHEST 1 VIEW)  COMPARISON:  Chest x-ray 09/02/2014  FINDINGS: The upright chest x-ray demonstrates stable cardiac enlargement. The mediastinal and hilar contours are stable. Low lung volumes with vascular crowding and atelectasis. Mild vascular congestion without overt pulmonary edema.  Two views of the abdomen demonstrate scattered air in the colon. There are air-filled small bowel loops air-fluid levels but no significant distention. Could not exclude an early or partial small bowel obstruction. No free air. The soft tissue shadows are maintained. The bony structures are intact.  IMPRESSION: 1. Stable cardiac enlargement. 2. Low lung volumes with vascular crowding, vascular congestion and bibasilar atelectasis. 3. Possible early or partial small bowel obstruction.   Electronically Signed   By: MKalman JewelsM.D.   On: 09/02/2014 18:00    Review of Systems  Unable to perform ROS: mental acuity   Blood pressure 119/63, pulse 105, temperature 98.9 F (37.2 C), temperature source Oral, resp. rate 32, height 5' 10" (1.778 m), weight 93.4 kg (205 lb 14.6 oz), SpO2 91 %. Physical Exam  Constitutional: He is oriented to  person, place, and time. He appears well-developed and well-nourished. No distress.  Tachycardic having some pain RUQ.  HENT:  Head: Normocephalic and atraumatic.  Nose: Nose normal.  Eyes: Conjunctivae and EOM are  normal. Right eye exhibits no discharge. Left eye exhibits no discharge. No scleral icterus.  Neck: Normal range of motion. Neck supple. No JVD present. No tracheal deviation present. No thyromegaly present.  Cardiovascular: Normal heart sounds and intact distal pulses.   No murmur heard. Tachycardic with AF  Respiratory: Effort normal and breath sounds normal. No respiratory distress. He has no wheezes. He has no rales. He exhibits no tenderness.  GI: Soft. He exhibits no distension and no mass. There is tenderness (Pain RUQ). There is no rebound and no guarding.  Decreased BS Midline abdominal and RLQ incisions well healed.  Musculoskeletal: He exhibits no edema or tenderness.  Lymphadenopathy:    He has no cervical adenopathy.  Neurological: He is alert and oriented to person, place, and time. No cranial nerve deficit.  Skin: Skin is warm and dry. No rash noted. He is not diaphoretic. No erythema. No pallor.  Psychiatric:  He feels terrible, and is confused, he is unsure of dates when things happened.  He has had to many people asking the same questions.    Assessment/Plan: 1.  Cholecystitis, cholelithiasis and probable choledocholithiasis. 2.  Elevated LFT's/mild pancreatitis 3.   Chronic atrial fibrillation on chronic anitcoagulation  He is unsure of the last dose, may have been yesterday. 09/02/14.   Chronic diastolic heart failure   OSA (obstructive sleep apnea)    moderate with AHI 17.53/hr now on CPAP at 7cm H2O     Hypertension   Peripheral neuropathy    both feet, can feel pain     Neuromuscular disorder   Gout    in 70's, "not in toe but everywhere else"  Vitamin B12 deficiency   Arthritis   Neuromuscular disorder   Low  back pain    when lying flat  Anemia    hx of  Swelling    both legs sometimes  Glaucoma   Rosacea    Plan:  Agree with medical management now,  he needs to be seen by GI and decide if he needs an ERCP.  I have ordered AM labs.  He will need medical clearance and we will need to decide if he can have  surgery or if we will need to place a drain, with plans for a later cholecystectomy.  Currently it is to early to make that decision.  I have paged GI. He is having allot of pain, I have increased his dilaudid, and stopped the oxycodone IR.    Cameron [name],Cameron [name] 09/03/2014, 7:20 AM

## 2014-09-03 NOTE — Plan of Care (Signed)
Problem: Phase I Progression Outcomes Goal: Pain controlled with appropriate interventions Outcome: Progressing     

## 2014-09-03 NOTE — H&P (Signed)
Reason for Consult: Acute cholecystitis  Chief Complaint: Chief Complaint  Patient presents with  . Abdominal Pain   Referring Physician(s): CCS  History of Present Illness: Cameron [name] is a 80 y.o. male who presented 12/2 with RUQ pain going on for a couple days, elevated labs- bilirubin and wbc from PCP office. The son provides history as patient is sleepy after benadryl and off and on confusion just recently. The son states no fever or chills were documented, given the persistent RUQ pain, confusion and abnormal labs they presented to the ED. Imaging studies, labs and physical exam revealed acute cholecystitis. CCS and GI have seen the patient and requested IR consult for management.   Past Medical History  Diagnosis Date  . Rosacea   . Gout     in 70's, "not in toe but everywhere else"  . Vitamin B12 deficiency   . Arthritis   . Neuromuscular disorder   . Peripheral neuropathy     both feet, can feel pain  . Hypertension   . Chronic anticoagulation   . Low back pain     when lying flat  . Anemia     hx of  . Swelling     both legs sometimes  . OSA (obstructive sleep apnea)     moderate with AHI 17.53/hr now on CPAP at 7cm H2O  . Chronic atrial fibrillation   . Acute cholecystitis 09/02/2014    Past Surgical History  Procedure Laterality Date  . Hemicolectomy  03/1996    with 3 lymph nodes  . Cardioversion      at least 5 times  . Appendectomy      1997  . Tumor removal Right 1961    back of leg  . Cataract extraction Bilateral   . Inguinal hernia repair Left 08/21/2013    Procedure:  LEFT INGUINAL HERNIA REPAIR WITH MESH;  Surgeon: Earnstine Regal, MD;  Location: [facility];  Service: General;  Laterality: Left;  . Insertion of mesh Left 08/21/2013    Procedure: INSERTION OF MESH;  Surgeon: Earnstine Regal, MD;  Location: [facility];  Service: General;  Laterality: Left;  . Cardioversion N/A 10/24/2013    Procedure: CARDIOVERSION;  Surgeon: Sueanne Margarita, MD;   Location: [facility];  Service: Cardiovascular;  Laterality: N/A;  . Inguinal hernia repair Right 01/29/2014    Procedure:  REPAIR RIGHT INGUNIAL HERNIA WITH MESH;  Surgeon: Earnstine Regal, MD;  Location: [facility];  Service: General;  Laterality: Right;    Allergies: Ace inhibitors and Clonidine derivatives  Medications: Prior to Admission medications   Medication Sig Start Date End Date Taking? Authorizing Provider  bimatoprost (LUMIGAN) 0.01 % SOLN Place 1 drop into both eyes at bedtime.    Yes Historical Provider, MD  bisacodyl (DULCOLAX) 5 MG EC tablet Take 5 mg by mouth. Take as directed.   Yes Historical Provider, MD  cyanocobalamin (,VITAMIN B-12,) 1000 MCG/ML injection Inject 1,000 mcg into the muscle every 30 (thirty) days.    Yes Historical Provider, MD  rivaroxaban (XARELTO) 20 MG TABS tablet Take 20 mg by mouth daily with supper.   Yes Historical Provider, MD  doxazosin (CARDURA) 4 MG tablet TAKE 1 TABLET BY MOUTH EVERY DAY    Traci R Turner, MD  fish oil-omega-3 fatty acids 1000 MG capsule Take 1 g by mouth 2 (two) times daily.     Historical Provider, MD  folic acid (FOLVITE) 1 MG tablet Take 1 mg by mouth daily.  Historical Provider, MD  furosemide (LASIX) 40 MG tablet TAKE 1 TABLET BY MOUTH EVERY DAY AND EXTRA AS DIRECTED    Sueanne Margarita, MD  meloxicam (MOBIC) 15 MG tablet Take 15 mg by mouth daily as needed for pain.     Historical Provider, MD  metoprolol succinate (TOPROL-XL) 25 MG 24 hr tablet Take 25 mg by mouth every morning.    Historical Provider, MD  traMADol (ULTRAM) 50 MG tablet Take 50 mg by mouth every 4 (four) hours as needed for moderate pain or severe pain. Maximum dose= 8 tablets per day    Historical Provider, MD    Family History  Problem Relation Age of Onset  . Stroke Mother 25  . Dementia Sister   . Cancer Father     lung    History   Social History  . Marital Status: Married    Spouse Name: N/A    Number of Children: N/A  . Years of  Education: N/A   Social History Main Topics  . Smoking status: Former Smoker -- 2.00 packs/day    Types: Cigarettes    Quit date: 10/02/1974  . Smokeless tobacco: Never Used  . Alcohol Use: Yes     Comment: 1-2 beers per day  . Drug Use: No  . Sexual Activity: None   Other Topics Concern  . None   Social History Narrative   Pt lives in [place] with spouse.   Retired in 1999 from the [employer].  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          Review of Systems: Unable to be obtained by patient.  Review of Systems  Vital Signs: BP 128/69 mmHg  Pulse 79  Temp(Src) 99.1 F (37.3 C) (Oral)  Resp 18  Ht 5\' 10"  (1.778 m)  Wt 205 lb 14.6 oz (93.4 kg)  BMI 29.54 kg/m2  SpO2 97%  Physical Exam General: Sleepy (just received IV benadryl), family in room Heart: Irr irregular without M/G/R Lungs: CTA B/L Abd: Soft, RUQ tenderness, (+) hypo BS  Imaging: Dg Chest 2 View  09/02/2014   CLINICAL DATA:  Shortness of breath 1 day after colonoscopy. Right anterior chest pain and right lower rib pain.  EXAM: CHEST  2 VIEW  COMPARISON:  11/15/2013  FINDINGS: The heart is enlarged but stable. There is tortuosity, ectasia and calcification of the thoracic aorta. There are chronic bronchitic lung changes but no acute pulmonary findings. Low lung volumes with vascular crowding and streaky basilar atelectasis. No pleural effusion or pneumothorax. No free air is identified under the hemidiaphragms. The bony thorax is intact.  IMPRESSION: 1. Mild stable cardiac enlargement. 2. Low lung volumes with vascular crowding and streaky bibasilar atelectasis. 3. No pneumothorax or free intra-abdominal air.   Electronically Signed   By: Kalman Jewels M.D.   On: 09/02/2014 12:09   Dg Ribs Unilateral Right  09/02/2014   CLINICAL DATA:  Shortness of breath for 1 day, RIGHT anterior chest and RIGHT lower rib pain post colonoscopy  EXAM: RIGHT RIBS - 2  VIEW  COMPARISON:  11/15/2013 chest radiograph  FINDINGS: BB placed at site of symptoms at lower lateral RIGHT chest.  Osseous mineralization normal.  No rib fracture or bone destruction.  No focal osseous abnormalities identified at site of clinical concern.  Scattered endplate spur formation thoracic spine.  IMPRESSION: No RIGHT rib abnormalities are radiographically identified.   Electronically Signed   By: Lavonia Dana  M.D.   On: 09/02/2014 12:05   Ct Head Wo Contrast  09/02/2014   CLINICAL DATA:  Altered mental status after colonoscopy 1 day ago, possibly related to anesthesia, now worsening.  EXAM: CT HEAD WITHOUT CONTRAST  TECHNIQUE: Contiguous axial images were obtained from the base of the skull through the vertex without intravenous contrast.  COMPARISON:  CT of the head June 16, 2009  FINDINGS: The ventricles and sulci are normal for age. No intraparenchymal hemorrhage, mass effect nor midline shift. Patchy supratentorial white matter hypodensities are less than expected for patient's age and though non-specific suggest sequelae of chronic small vessel ischemic disease. No acute large vascular territory infarcts.  No abnormal extra-axial fluid collections. Basal cisterns are patent. Mild calcific atherosclerosis of the carotid siphons.  No skull fracture. The included ocular globes and orbital contents are non-suspicious. Status post bilateral ocular lens implants. The mastoid aircells and included paranasal sinuses are well-aerated. Patient is edentulous. Mild temporomandibular osteoarthrosis.  IMPRESSION: No acute intracranial process ; normal noncontrast CT of the head for age.   Electronically Signed   By: Elon Alas   On: 09/02/2014 21:00   Ct Abdomen Pelvis W Contrast  09/02/2014   CLINICAL DATA:  80 year old male with right abdominal and pelvic pain following colonoscopy yesterday. History of right hemicolectomy for polyp. Initial encounter.  EXAM: CT ABDOMEN AND PELVIS WITH  CONTRAST  TECHNIQUE: Multidetector CT imaging of the abdomen and pelvis was performed using the standard protocol following bolus administration of intravenous contrast.  CONTRAST:  124mL OMNIPAQUE IOHEXOL 300 MG/ML  SOLN  COMPARISON:  None.  FINDINGS: Mild bibasilar atelectasis and cardiomegaly identified.  The gallbladder is distended with wall thickening and adjacent inflammation compatible with acute cholecystitis. A possible stone in the cystic duct is identified.  The liver, spleen, adrenal glands, kidneys, and pancreas are unremarkable.  There is no evidence of enlarged lymph nodes, biliary dilatation or abdominal aortic aneurysm.  A small amount of free fluid in the pelvis is present.  Evidence of right hemicolectomy noted. The bowel is otherwise unremarkable. There is no evidence of bowel obstruction, abscess or pneumoperitoneum.  The bladder is unremarkable.  No acute or suspicious bony abnormalities are identified.  Degenerative changes in the lumbar spine again identified.  IMPRESSION: Acute cholecystitis without biliary dilatation. Equivocal stone in the cystic duct identified.  Small amount of free pelvic fluid.  Cardiomegaly and mild bibasilar atelectasis.   Electronically Signed   By: Hassan Rowan M.D.   On: 09/02/2014 21:04   Dg Abd Acute W/chest  09/02/2014   CLINICAL DATA:  Acute epigastric and right-sided abdominal pain today. Shortness of breath.  EXAM: ACUTE ABDOMEN SERIES (ABDOMEN 2 VIEW & CHEST 1 VIEW)  COMPARISON:  Chest x-ray 09/02/2014  FINDINGS: The upright chest x-ray demonstrates stable cardiac enlargement. The mediastinal and hilar contours are stable. Low lung volumes with vascular crowding and atelectasis. Mild vascular congestion without overt pulmonary edema.  Two views of the abdomen demonstrate scattered air in the colon. There are air-filled small bowel loops air-fluid levels but no significant distention. Could not exclude an early or partial small bowel obstruction. No free  air. The soft tissue shadows are maintained. The bony structures are intact.  IMPRESSION: 1. Stable cardiac enlargement. 2. Low lung volumes with vascular crowding, vascular congestion and bibasilar atelectasis. 3. Possible early or partial small bowel obstruction.   Electronically Signed   By: Kalman Jewels M.D.   On: 09/02/2014 18:00    Labs:  CBC:  Recent Labs  11/15/13 0245 01/22/14 0910 09/02/14 1706 09/03/14 0751  WBC 6.4 7.3 19.2* 21.0*  HGB 13.8 14.4 12.6* 12.6*  HCT 41.7 43.4 37.5* 36.7*  PLT 144* 149* 149* 147*    COAGS: No results for input(s): INR, APTT in the last 8760 hours.  BMP:  Recent Labs  11/15/13 0245 01/22/14 0910 09/02/14 1706 09/03/14 0751  NA 137 140 132* 135*  K 3.8 4.0 3.7 4.0  CL 97 98 92* 96  CO2 24 28 20 22   GLUCOSE 195* 149* 193* 135*  BUN 16 13 14 18   CALCIUM 9.6 9.5 8.9 8.9  CREATININE 0.89 1.02 1.07 1.17  GFRNONAA 80* 69* 64* 58*  GFRAA >90 80* 75* 67*    LIVER FUNCTION TESTS:  Recent Labs  09/02/14 1706 09/03/14 0751  BILITOT 4.0* 4.0*  AST 39* 32  ALT 26 26  ALKPHOS 94 92  PROT 7.2 7.0  ALBUMIN 2.9* 2.7*    Assessment and Plan: Acute cholecystitis, CT 12/2, no ERCP warranted at this time per GI Leukocytosis 21 (19) on antibiotics INR 2.85 today, will recheck in am Request for percutaneous cholecystectomy tube placement with moderate sedation Patient will be NPO after midnight, last dose of Xarelto 12/2, labs reviewed, on IV antibiotics.  Risks and Benefits discussed with the patient. All of the patient's questions were answered, patient is agreeable to proceed. Consent signed and in chart. OSA wears CPAP Chronic atrial fibrillation on xarelto previously, stopped for colonoscopy-last dose 03/5 Chronic diastolic CHF   Thank you for this interesting consult.  I greatly enjoyed meeting Cameron [name] and look forward to participating in their care.   I spent a total of 40 minutes face to face in clinical  consultation, greater than 50% of which was counseling/coordinating care   Signed: Hedy Jacob 09/03/2014, 4:41 PM

## 2014-09-03 NOTE — Consult Note (Signed)
Prelim GI note:  Pt seen and examined, chart reviewed.  Full note to follow.  IMPR:  1.  Acute chlolecystitis 2.  No pancreatitis 3.  Unlikely to have CBD stone 4.  No signif improvement on overnight medical therapy   Recomm:  1.  Will discuss w/ surgery--???chlolecystectomy today? 2.  No pre-op ERCP   Cleotis Nipper, M.D. [phone]

## 2014-09-03 NOTE — Progress Notes (Signed)
Progress Note   Cameron [name] DOB: 10/12/1933 DOA: 09/02/2014 PCP: Simona Huh, MD   Brief Narrative:   Cameron [name] is an 80 y.o. male with a PMH of hypertension, OSA, neuropathy, atrial fibrillation on chronic Xarelto and chronic diastolic heart failure who was admitted 09/02/14 with a chief complaint of RUQ pain and reports of confusion. He saw his PCP and was found to have hyperbilirubinemia. A CT scan of the abdomen, done on admission, showed acute cholecystitis. Surgery has been consulted.  Assessment/Plan:   Principal Problem:   Acute cholecystitis with hyperlipasemia and hyperbilirubinemia/cholelithiasis/choledocholithiasis  CT findings as noted below. Stone noted in the cystic duct.  Nothing by mouth.  Continue empiric IV Zosyn.  Surgical consultation /GI consultations.  Will need a cholecystostomy versus cholecystectomy +/- ERCP.  Active Problems:   Hyperglycemia  Check hemoglobin A1c.    Confusion  Continue neuro checks.  CT of the head negative.    Chronic atrial fibrillation / chronic anticoagulation  Xarelto on hold.  Heart rate currently in the low 100s.  Continue metoprolol. Will add IV metoprolol when necessary heart rate greater than 100.    Hypertension  Continue Cardura and metoprolol.    Chronic diastolic heart failure / elevated BNP  The patient is currently nothing by mouth with gentle IV fluids going.  Lasix on hold.  Monitor fluid volume status closely.    OSA (obstructive sleep apnea)  Continue C Pap daily at bedtime.    DVT Prophylaxis  Deferred in anticipation of impending surgery.  Code Status: Full. Family Communication: No family at the bedside.  Son, Rajohn Henery updated by telephone. Disposition Plan: Home when stable.   IV Access:    Peripheral IV   Procedures and diagnostic studies:   Dg Chest 2 View 09/02/2014: 1. Mild stable cardiac enlargement. 2. Low lung volumes with vascular  crowding and streaky bibasilar atelectasis. 3. No pneumothorax or free intra-abdominal air.     Dg Ribs Unilateral Right 09/02/2014: No RIGHT rib abnormalities are radiographically identified.     Ct Head Wo Contrast 09/02/2014: No acute intracranial process ; normal noncontrast CT of the head for age.     Ct Abdomen Pelvis W Contrast 09/02/2014: Acute cholecystitis without biliary dilatation. Equivocal stone in the cystic duct identified.  Small amount of free pelvic fluid.  Cardiomegaly and mild bibasilar atelectasis.     Dg Abd Acute W/chest 09/02/2014: 1. Stable cardiac enlargement. 2. Low lung volumes with vascular crowding, vascular congestion and bibasilar atelectasis. 3. Possible early or partial small bowel obstruction.     Medical Consultants:    Surgery  GI  Anti-Infectives:    Vancomycin 09/02/14---> 09/02/14  Zosyn 09/02/14--->   Subjective:   Cameron [name] complains of RUQ abdominal discomfort and dyspnea this morning.  He seems weak.    Objective:    Filed Vitals:   09/03/14 0330 09/03/14 0400 09/03/14 0500 09/03/14 0600  BP:  117/57 141/74 119/63  Pulse: 101 112 102 105  Temp:  98.9 F (37.2 C)    TempSrc:  Oral    Resp: 34 25 31 32  Height:      Weight:      SpO2: 92% 91% 92% 91%    Intake/Output Summary (Last 24 hours) at 09/03/14 0710 Last data filed at 09/03/14 0600  Gross per 24 hour  Intake 329.17 ml  Output      0 ml  Net 329.17 ml    Exam: Gen:  NAD, weak Cardiovascular:  HSIR, No M/R/G Respiratory:  Lungs CTAB Gastrointestinal:  Abdomen soft, RUQ pain, + BS Extremities:  1+ edema   Data Reviewed:    Labs: Basic Metabolic Panel:  Recent Labs Lab 09/02/14 1706  NA 132*  K 3.7  CL 92*  CO2 20  GLUCOSE 193*  BUN 14  CREATININE 1.07  CALCIUM 8.9   GFR Estimated Creatinine Clearance: 65.3 mL/min (by C-G formula based on Cr of 1.07). Liver Function Tests:  Recent Labs Lab 09/02/14 1706  AST 39*  ALT 26  ALKPHOS 94    BILITOT 4.0*  PROT 7.2  ALBUMIN 2.9*    Recent Labs Lab 09/02/14 1706  LIPASE 145*   CBC:  Recent Labs Lab 09/02/14 1706  WBC 19.2*  NEUTROABS 16.7*  HGB 12.6*  HCT 37.5*  MCV 85.0  PLT 149*   BNP (last 3 results)  Recent Labs  08/19/14 0851 09/02/14 1706  PROBNP 26.0 800.4*   Sepsis Labs:  Recent Labs Lab 09/02/14 1706 09/02/14 1717  WBC 19.2*  --   LATICACIDVEN  --  1.85   Microbiology Recent Results (from the past 240 hour(s))  MRSA PCR Screening     Status: Abnormal   Collection Time: 09/03/14 12:27 AM  Result Value Ref Range Status   MRSA by PCR INVALID RESULTS, SPECIMEN SENT FOR CULTURE (A) NEGATIVE Final    Comment: SPOKE WITH CROFPS,S RN [phone] [place],N        The GeneXpert MRSA Assay (FDA approved for NASAL specimens only), is one component of a comprehensive MRSA colonization surveillance program. It is not intended to diagnose MRSA infection nor to guide or monitor treatment for MRSA infections.      Medications:   . doxazosin  4 mg Oral Daily  . metoprolol succinate  25 mg Oral q morning - 10a  . naloxone      . piperacillin-tazobactam (ZOSYN)  IV  3.375 g Intravenous Q8H  . sodium chloride  3 mL Intravenous Q12H   Continuous Infusions: . sodium chloride 50 mL/hr at 09/03/14 0026    Time spent: 35 minutes with > 50% of time discussing current diagnostic test results, clinical impression and plan of care with the patient, his son, and the surgical consultant, Will.    LOS: 1 day   RAMA,CHRISTINA  [facility] Pager [phone]. If unable to reach me by pager, please call my cell phone at [phone].  *Please refer to [email or web address], password [email or web address] to get updated schedule on who will round on this patient, as hospitalists switch teams weekly. If 7PM-7AM, please contact night-coverage at [email or web address], password [email or web address] for any overnight needs.  09/03/2014, 7:10 AM

## 2014-09-03 NOTE — Consult Note (Signed)
Referring Provider:  Dr. Star Age Primary Care Physician:  Simona Huh, MD Primary Gastroenterologist:  Dr. Watt Climes  Reason for Consultation:  Possible choledocholithiasis  HPI: Cameron [name] is a 80 y.o. male, admitted to the hospital yesterday with lethargy and malaise and upper abdominal discomfort in the right upper quadrant.   Just 2 days earlier, he had had an unremarkable colonoscopy, with a small sigmoid polyp cold biopsied and removed.   The next day, which is the day prior to admission, the patient was seen in the office of his primary physician for a routine visit, and at that time was noted to be somewhat lethargic but alert and talkative . He was thought possibly to have some residual sedative affect from his medications.   At that time, a temperature was not checked but his blood work came back showing a white count of 18,800 and a bilirubin of 3.8, with other liver chemistries normal.   Then, yesterday, the patient reported to the emergency room where he was apparently quite lethargic and a CT scan showed a gallbladder full of stones with gallbladder wall thickening, mild elevation of lipase but no evidence of pancreatic inflammation on CT scanning, and a normal caliber duct with a possible stone in the cystic duct.   Overnight, the patient has been on antibiotics with an essentially stable but persistent significant elevation of his white count, intermittent fevers by rectal temp, marginal improvement in mental status, and resolution of his mild  hyperlipasemia that was present on admission, with stable elevation of bilirubin and other liver chemistries remained normal.   I was asked by the surgical service to see this patient with respect to possible need for ERCP.  Past Medical History  Diagnosis Date  . Rosacea   . Gout     in 70's, "not in toe but everywhere else"  . Vitamin B12 deficiency   . Arthritis   . Neuromuscular disorder   . Peripheral neuropathy      both feet, can feel pain  . Hypertension   . Chronic anticoagulation   . Low back pain     when lying flat  . Anemia     hx of  . Swelling     both legs sometimes  . OSA (obstructive sleep apnea)     moderate with AHI 17.53/hr now on CPAP at 7cm H2O  . Chronic atrial fibrillation   . Acute cholecystitis 09/02/2014    Past Surgical History  Procedure Laterality Date  . Hemicolectomy  03/1996    with 3 lymph nodes  . Cardioversion      at least 5 times  . Appendectomy      1997  . Tumor removal Right 1961    back of leg  . Cataract extraction Bilateral   . Inguinal hernia repair Left 08/21/2013    Procedure:  LEFT INGUINAL HERNIA REPAIR WITH MESH;  Surgeon: Earnstine Regal, MD;  Location: [facility];  Service: General;  Laterality: Left;  . Insertion of mesh Left 08/21/2013    Procedure: INSERTION OF MESH;  Surgeon: Earnstine Regal, MD;  Location: [facility];  Service: General;  Laterality: Left;  . Cardioversion N/A 10/24/2013    Procedure: CARDIOVERSION;  Surgeon: Sueanne Margarita, MD;  Location: [facility];  Service: Cardiovascular;  Laterality: N/A;  . Inguinal hernia repair Right 01/29/2014    Procedure:  REPAIR RIGHT INGUNIAL HERNIA WITH MESH;  Surgeon: Earnstine Regal, MD;  Location: [facility];  Service:  General;  Laterality: Right;    Prior to Admission medications   Medication Sig Start Date End Date Taking? Authorizing Provider  bimatoprost (LUMIGAN) 0.01 % SOLN Place 1 drop into both eyes at bedtime.    Yes Historical Provider, MD  bisacodyl (DULCOLAX) 5 MG EC tablet Take 5 mg by mouth. Take as directed.   Yes Historical Provider, MD  cyanocobalamin (,VITAMIN B-12,) 1000 MCG/ML injection Inject 1,000 mcg into the muscle every 30 (thirty) days.    Yes Historical Provider, MD  rivaroxaban (XARELTO) 20 MG TABS tablet Take 20 mg by mouth daily with supper.   Yes Historical Provider, MD  doxazosin (CARDURA) 4 MG tablet TAKE 1 TABLET BY MOUTH EVERY DAY    Traci R Turner, MD  fish oil-omega-3  fatty acids 1000 MG capsule Take 1 g by mouth 2 (two) times daily.     Historical Provider, MD  folic acid (FOLVITE) 1 MG tablet Take 1 mg by mouth daily.     Historical Provider, MD  furosemide (LASIX) 40 MG tablet TAKE 1 TABLET BY MOUTH EVERY DAY AND EXTRA AS DIRECTED    Sueanne Margarita, MD  meloxicam (MOBIC) 15 MG tablet Take 15 mg by mouth daily as needed for pain.     Historical Provider, MD  metoprolol succinate (TOPROL-XL) 25 MG 24 hr tablet Take 25 mg by mouth every morning.    Historical Provider, MD  traMADol (ULTRAM) 50 MG tablet Take 50 mg by mouth every 4 (four) hours as needed for moderate pain or severe pain. Maximum dose= 8 tablets per day    Historical Provider, MD    Current Facility-Administered Medications  Medication Dose Route Frequency Provider Last Rate Last Dose  . acetaminophen (OFIRMEV) IV 1,000 mg  1,000 mg Intravenous 4 times per day Earnstine Regal, PA-C   1,000 mg at 09/03/14 0946  . acetaminophen (TYLENOL) tablet 650 mg  650 mg Oral Q6H PRN Berle Mull, MD       Or  . acetaminophen (TYLENOL) suppository 650 mg  650 mg Rectal Q6H PRN Berle Mull, MD      . diphenhydrAMINE (BENADRYL) injection 12.5 mg  12.5 mg Intravenous Q6H PRN Venetia Maxon Rama, MD   12.5 mg at 09/03/14 1259  . doxazosin (CARDURA) tablet 4 mg  4 mg Oral Daily Berle Mull, MD   4 mg at 09/03/14 0919  . HYDROmorphone (DILAUDID) injection 0.5-1 mg  0.5-1 mg Intravenous Q2H PRN Earnstine Regal, PA-C   1 mg at 09/03/14 1127  . metoprolol (LOPRESSOR) injection 2.5 mg  2.5 mg Intravenous Q4H PRN Venetia Maxon Rama, MD      . metoprolol succinate (TOPROL-XL) 24 hr tablet 25 mg  25 mg Oral q morning - 10a Berle Mull, MD   25 mg at 09/03/14 0919  . ondansetron (ZOFRAN) tablet 4 mg  4 mg Oral Q6H PRN Berle Mull, MD       Or  . ondansetron (ZOFRAN) injection 4 mg  4 mg Intravenous Q6H PRN Berle Mull, MD      . piperacillin-tazobactam (ZOSYN) IVPB 3.375 g  3.375 g Intravenous Q8H Dorrene German,  RPH   3.375 g at 09/03/14 0919  . sodium chloride 0.9 % injection 3 mL  3 mL Intravenous Q12H Berle Mull, MD   3 mL at 09/03/14 1000    Allergies as of 09/02/2014 - Review Complete 09/02/2014  Allergen Reaction Noted  . Ace inhibitors  08/22/2011  . Clonidine derivatives Rash 08/22/2011  Family History  Problem Relation Age of Onset  . Stroke Mother 10  . Dementia Sister   . Cancer Father     lung    History   Social History  . Marital Status: Married    Spouse Name: N/A    Number of Children: N/A  . Years of Education: N/A   Occupational History  . Not on file.   Social History Main Topics  . Smoking status: Former Smoker -- 2.00 packs/day    Types: Cigarettes    Quit date: 10/02/1974  . Smokeless tobacco: Never Used  . Alcohol Use: Yes     Comment: 1-2 beers per day  . Drug Use: No  . Sexual Activity: Not on file   Other Topics Concern  . Not on file   Social History Narrative   Pt lives in [place] with spouse.   Retired in 1999 from the [employer].  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          Review of Systems:  Not obtained  Physical Exam: Vital signs in last 24 hours: Temp:  [97.8 F (36.6 C)-102.8 F (39.3 C)] 99.1 F (37.3 C) (12/03 0735) Pulse Rate:  [61-145] 79 (12/03 1100) Resp:  [15-35] 18 (12/03 1100) BP: (104-150)/(55-109) 128/69 mmHg (12/03 1100) SpO2:  [87 %-100 %] 97 % (12/03 1100) Weight:  [93.4 kg (205 lb 14.6 oz)] 93.4 kg (205 lb 14.6 oz) (12/03 0100)   Generally healthy-appearing gentleman who is rather quiet but responds briefly to questions, not frankly lethargic, no acute distress, no focal neurologic deficits, anicteric and without pallor, skin warm and dry. Chest is clear anteriorly, without obvious rales, and he is in no rest or distress. Heart rate is elevated around 120, no murmurs appreciated. Abdomen has quiet bowel sounds, is slightly adipose, and really is  without any discernible guarding, tenderness, or mass effect.  Intake/Output from previous day: 12/02 0701 - 12/03 0700 In: 329.2 [I.V.:279.2; IV Piggyback:50] Out: -  Intake/Output this shift: Total I/O In: 3 [I.V.:3] Out: -   Lab Results:  Recent Labs  09/02/14 1706 09/03/14 0751  WBC 19.2* 21.0*  HGB 12.6* 12.6*  HCT 37.5* 36.7*  PLT 149* 147*   BMET  Recent Labs  09/02/14 1706 09/03/14 0751  NA 132* 135*  K 3.7 4.0  CL 92* 96  CO2 20 22  GLUCOSE 193* 135*  BUN 14 18  CREATININE 1.07 1.17  CALCIUM 8.9 8.9   LFT  Recent Labs  09/03/14 0751  PROT 7.0  ALBUMIN 2.7*  AST 32  ALT 26  ALKPHOS 92  BILITOT 4.0*   PT/INR No results for input(s): LABPROT, INR in the last 72 hours.  Studies/Results: Dg Chest 2 View  09/02/2014   CLINICAL DATA:  Shortness of breath 1 day after colonoscopy. Right anterior chest pain and right lower rib pain.  EXAM: CHEST  2 VIEW  COMPARISON:  11/15/2013  FINDINGS: The heart is enlarged but stable. There is tortuosity, ectasia and calcification of the thoracic aorta. There are chronic bronchitic lung changes but no acute pulmonary findings. Low lung volumes with vascular crowding and streaky basilar atelectasis. No pleural effusion or pneumothorax. No free air is identified under the hemidiaphragms. The bony thorax is intact.  IMPRESSION: 1. Mild stable cardiac enlargement. 2. Low lung volumes with vascular crowding and streaky bibasilar atelectasis. 3. No pneumothorax or free intra-abdominal air.   Electronically Signed   By: Elta Guadeloupe  Gallerani M.D.   On: 09/02/2014 12:09   Dg Ribs Unilateral Right  09/02/2014   CLINICAL DATA:  Shortness of breath for 1 day, RIGHT anterior chest and RIGHT lower rib pain post colonoscopy  EXAM: RIGHT RIBS - 2 VIEW  COMPARISON:  11/15/2013 chest radiograph  FINDINGS: BB placed at site of symptoms at lower lateral RIGHT chest.  Osseous mineralization normal.  No rib fracture or bone destruction.  No focal  osseous abnormalities identified at site of clinical concern.  Scattered endplate spur formation thoracic spine.  IMPRESSION: No RIGHT rib abnormalities are radiographically identified.   Electronically Signed   By: Lavonia Dana M.D.   On: 09/02/2014 12:05   Ct Head Wo Contrast  09/02/2014   CLINICAL DATA:  Altered mental status after colonoscopy 1 day ago, possibly related to anesthesia, now worsening.  EXAM: CT HEAD WITHOUT CONTRAST  TECHNIQUE: Contiguous axial images were obtained from the base of the skull through the vertex without intravenous contrast.  COMPARISON:  CT of the head June 16, 2009  FINDINGS: The ventricles and sulci are normal for age. No intraparenchymal hemorrhage, mass effect nor midline shift. Patchy supratentorial white matter hypodensities are less than expected for patient's age and though non-specific suggest sequelae of chronic small vessel ischemic disease. No acute large vascular territory infarcts.  No abnormal extra-axial fluid collections. Basal cisterns are patent. Mild calcific atherosclerosis of the carotid siphons.  No skull fracture. The included ocular globes and orbital contents are non-suspicious. Status post bilateral ocular lens implants. The mastoid aircells and included paranasal sinuses are well-aerated. Patient is edentulous. Mild temporomandibular osteoarthrosis.  IMPRESSION: No acute intracranial process ; normal noncontrast CT of the head for age.   Electronically Signed   By: Elon Alas   On: 09/02/2014 21:00   Ct Abdomen Pelvis W Contrast  09/02/2014   CLINICAL DATA:  80 year old male with right abdominal and pelvic pain following colonoscopy yesterday. History of right hemicolectomy for polyp. Initial encounter.  EXAM: CT ABDOMEN AND PELVIS WITH CONTRAST  TECHNIQUE: Multidetector CT imaging of the abdomen and pelvis was performed using the standard protocol following bolus administration of intravenous contrast.  CONTRAST:  154mL OMNIPAQUE  IOHEXOL 300 MG/ML  SOLN  COMPARISON:  None.  FINDINGS: Mild bibasilar atelectasis and cardiomegaly identified.  The gallbladder is distended with wall thickening and adjacent inflammation compatible with acute cholecystitis. A possible stone in the cystic duct is identified.  The liver, spleen, adrenal glands, kidneys, and pancreas are unremarkable.  There is no evidence of enlarged lymph nodes, biliary dilatation or abdominal aortic aneurysm.  A small amount of free fluid in the pelvis is present.  Evidence of right hemicolectomy noted. The bowel is otherwise unremarkable. There is no evidence of bowel obstruction, abscess or pneumoperitoneum.  The bladder is unremarkable.  No acute or suspicious bony abnormalities are identified.  Degenerative changes in the lumbar spine again identified.  IMPRESSION: Acute cholecystitis without biliary dilatation. Equivocal stone in the cystic duct identified.  Small amount of free pelvic fluid.  Cardiomegaly and mild bibasilar atelectasis.   Electronically Signed   By: Hassan Rowan M.D.   On: 09/02/2014 21:04   Dg Abd Acute W/chest  09/02/2014   CLINICAL DATA:  Acute epigastric and right-sided abdominal pain today. Shortness of breath.  EXAM: ACUTE ABDOMEN SERIES (ABDOMEN 2 VIEW & CHEST 1 VIEW)  COMPARISON:  Chest x-ray 09/02/2014  FINDINGS: The upright chest x-ray demonstrates stable cardiac enlargement. The mediastinal and hilar contours are stable.  Low lung volumes with vascular crowding and atelectasis. Mild vascular congestion without overt pulmonary edema.  Two views of the abdomen demonstrate scattered air in the colon. There are air-filled small bowel loops air-fluid levels but no significant distention. Could not exclude an early or partial small bowel obstruction. No free air. The soft tissue shadows are maintained. The bony structures are intact.  IMPRESSION: 1. Stable cardiac enlargement. 2. Low lung volumes with vascular crowding, vascular congestion and bibasilar  atelectasis. 3. Possible early or partial small bowel obstruction.   Electronically Signed   By: Kalman Jewels M.D.   On: 09/02/2014 18:00    Impression: I think the overall picture is most compatible with acute cholecystitis, in a background of medical illnesses including atrial fibrillation, chronic anticoagulation, and low-grade heart failure. His hyperbilirubinemia could be due to Gilbert's syndrome or reactive hepatopathy or Mirrizzi's syndrome; I doubt it is rising from a common duct stone, given his normal transaminases and the absence of any progression of rise over time, plus the absence of biliary ductal dilatation. In other words, I do not think this patient has septic cholangitis.  Plan:  I have discussed the case with Mr. Cameron [name] and Dr. Star Age of the surgical service. I am quite sure this patient does not have choledocholithiasis and I do not feel that he needs, or should have, and ERCP at this juncture. The risk would be too high, and the likely benefit would be to low, to justify it. On the other hand, the patient might be a bit too sick, taking into account his underlying medical illnesses, to favor doing surgery at this time, so consideration is being given to a percutaneous cholecystostomy and I think that this is a reasonable option. It does not appear, so far, but the patient is "cooling down" on medical therapy with antibiotics, so I do feel that some sort of intervention is needed.  I will obtain a direct bilirubin to look for evidence of Rosanna Randy syndrome, and a bedside ultrasound to help confirm the absence of biliary ductal dilatation or choledocholithiasis (doubt), while plans are made to move forward most likely with a cholecystostomy.   LOS: 1 day   Tell Rozelle V  09/03/2014, 3:00 PM

## 2014-09-03 NOTE — H&P (Signed)
[facility] History and Physical  Patient: Cameron [name]  IZT:[id number]  DOB: 05/27/1934  DOS: the patient was seen and examined on 09/02/2014 PCP: Simona Huh, MD  Chief Complaint: Right upper quadrant pain  HPI: Cameron [name] is a 80 y.o. male with Past medical history of hypertension, neuropathy, A. fib, chronic diastolic heart failure, obstructive sleep apnea. The patient is presenting with complaints of abdominal pain as well as confusion. Patient has undergone routine colonoscopy yesterday. Patient has been complaining of some right-sided upper quadrant pain after started to drink colonoscopy prep material. He continued to have this pain after the procedure and as per the son he also continues to have progressively worsening on and off confusion as. There was no fever or chills reported by patient did have complaints of nausea and has not been eating and drinking since the procedure. No bowel movement reported since the procedure. Patient didn't take Xarelto last night. No report of similar symptoms of abdominal pain prior to procedure in the past. No chest pain shortness of breath.  The patient is coming from home. And at his baseline independent for most of his ADL.  Review of Systems: as mentioned in the history of present illness.  A Comprehensive review of the other systems is negative.  Past Medical History  Diagnosis Date  . Rosacea   . Gout     in 70's, "not in toe but everywhere else"  . Vitamin B12 deficiency   . Arthritis   . Neuromuscular disorder   . Peripheral neuropathy     both feet, can feel pain  . Hypertension   . Chronic anticoagulation   . Low back pain     when lying flat  . Anemia     hx of  . Swelling     both legs sometimes  . OSA (obstructive sleep apnea)     moderate with AHI 17.53/hr now on CPAP at 7cm H2O  . Chronic atrial fibrillation    Past Surgical History  Procedure Laterality Date  . Hemicolectomy  03/1996    with 3  lymph nodes  . Cardioversion      at least 5 times  . Appendectomy      1997  . Tumor removal Right 1961    back of leg  . Cataract extraction Bilateral   . Inguinal hernia repair Left 08/21/2013    Procedure:  LEFT INGUINAL HERNIA REPAIR WITH MESH;  Surgeon: Earnstine Regal, MD;  Location: [facility];  Service: General;  Laterality: Left;  . Insertion of mesh Left 08/21/2013    Procedure: INSERTION OF MESH;  Surgeon: Earnstine Regal, MD;  Location: [facility];  Service: General;  Laterality: Left;  . Cardioversion N/A 10/24/2013    Procedure: CARDIOVERSION;  Surgeon: Sueanne Margarita, MD;  Location: [facility];  Service: Cardiovascular;  Laterality: N/A;  . Inguinal hernia repair Right 01/29/2014    Procedure:  REPAIR RIGHT INGUNIAL HERNIA WITH MESH;  Surgeon: Earnstine Regal, MD;  Location: [facility];  Service: General;  Laterality: Right;   Social History:  reports that he quit smoking about 39 years ago. His smoking use included Cigarettes. He smoked 2.00 packs per day. He has never used smokeless tobacco. He reports that he drinks alcohol. He reports that he does not use illicit drugs.  Allergies  Allergen Reactions  . Ace Inhibitors     Lips inflated to about "20 pound"  . Clonidine Derivatives Rash    Family History  Problem Relation Age of Onset  . Stroke Mother 1  . Dementia Sister   . Cancer Father     lung    Prior to Admission medications   Medication Sig Start Date End Date Taking? Authorizing Provider  bimatoprost (LUMIGAN) 0.01 % SOLN Place 1 drop into both eyes at bedtime.    Yes Historical Provider, MD  bisacodyl (DULCOLAX) 5 MG EC tablet Take 5 mg by mouth. Take as directed.   Yes Historical Provider, MD  cyanocobalamin (,VITAMIN B-12,) 1000 MCG/ML injection Inject 1,000 mcg into the muscle every 30 (thirty) days.    Yes Historical Provider, MD  rivaroxaban (XARELTO) 20 MG TABS tablet Take 20 mg by mouth daily with supper.   Yes Historical Provider, MD  doxazosin (CARDURA) 4  MG tablet TAKE 1 TABLET BY MOUTH EVERY DAY    Traci R Turner, MD  fish oil-omega-3 fatty acids 1000 MG capsule Take 1 g by mouth 2 (two) times daily.     Historical Provider, MD  folic acid (FOLVITE) 1 MG tablet Take 1 mg by mouth daily.     Historical Provider, MD  furosemide (LASIX) 40 MG tablet TAKE 1 TABLET BY MOUTH EVERY DAY AND EXTRA AS DIRECTED    Sueanne Margarita, MD  meloxicam (MOBIC) 15 MG tablet Take 15 mg by mouth daily as needed for pain.     Historical Provider, MD  metoprolol succinate (TOPROL-XL) 25 MG 24 hr tablet Take 25 mg by mouth every morning.    Historical Provider, MD  traMADol (ULTRAM) 50 MG tablet Take 50 mg by mouth every 4 (four) hours as needed for moderate pain or severe pain. Maximum dose= 8 tablets per day    Historical Provider, MD    Physical Exam: Filed Vitals:   09/02/14 2300 09/02/14 2330 09/03/14 0043 09/03/14 0100  BP: 123/68 124/59  121/71  Pulse:  98 99 107  Temp:    98.6 F (37 C)  TempSrc:    Oral  Resp:  19 17 17   Height:    5\' 10"  (1.778 m)  Weight:    93.4 kg (205 lb 14.6 oz)  SpO2:  94% 96% 95%    General: Alert, Awake and Oriented to Time, Place and Person. Appear in mild distress Eyes: PERRL ENT: Oral Mucosa clear moist. Neck: no JVD Cardiovascular: S1 and S2 Present, no Murmur, Peripheral Pulses Present Respiratory: Bilateral Air entry equal and Decreased, Clear to Auscultation, nmoCrackles, no wheezes Abdomen: Bowel Sound  present , Soft anright upper quadrant  tender Skin: no Rash Extremities: no Pedal edema, o calf tenderness Neurologic: Grossly no focal neuro deficit.  Labs on Admission:  CBC:  Recent Labs Lab 09/02/14 1706  WBC 19.2*  NEUTROABS 16.7*  HGB 12.6*  HCT 37.5*  MCV 85.0  PLT 149*    CMP     Component Value Date/Time   NA 132* 09/02/2014 1706   K 3.7 09/02/2014 1706   CL 92* 09/02/2014 1706   CO2 20 09/02/2014 1706   GLUCOSE 193* 09/02/2014 1706   BUN 14 09/02/2014 1706   CREATININE 1.07  09/02/2014 1706   CALCIUM 8.9 09/02/2014 1706   PROT 7.2 09/02/2014 1706   ALBUMIN 2.9* 09/02/2014 1706   AST 39* 09/02/2014 1706   ALT 26 09/02/2014 1706   ALKPHOS 94 09/02/2014 1706   BILITOT 4.0* 09/02/2014 1706   GFRNONAA 64* 09/02/2014 1706   GFRAA 75* 09/02/2014 1706     Recent Labs Lab 09/02/14 1706  LIPASE 145*   No results for input(s): AMMONIA in the last 168 hours.  No results for input(s): CKTOTAL, CKMB, CKMBINDEX, TROPONINI in the last 168 hours. BNP (last 3 results)  Recent Labs  08/19/14 0851 09/02/14 1706  PROBNP 26.0 800.4*    Radiological Exams on Admission: Dg Chest 2 View  09/02/2014   CLINICAL DATA:  Shortness of breath 1 day after colonoscopy. Right anterior chest pain and right lower rib pain.  EXAM: CHEST  2 VIEW  COMPARISON:  11/15/2013  FINDINGS: The heart is enlarged but stable. There is tortuosity, ectasia and calcification of the thoracic aorta. There are chronic bronchitic lung changes but no acute pulmonary findings. Low lung volumes with vascular crowding and streaky basilar atelectasis. No pleural effusion or pneumothorax. No free air is identified under the hemidiaphragms. The bony thorax is intact.  IMPRESSION: 1. Mild stable cardiac enlargement. 2. Low lung volumes with vascular crowding and streaky bibasilar atelectasis. 3. No pneumothorax or free intra-abdominal air.   Electronically Signed   By: Kalman Jewels M.D.   On: 09/02/2014 12:09   Dg Ribs Unilateral Right  09/02/2014   CLINICAL DATA:  Shortness of breath for 1 day, RIGHT anterior chest and RIGHT lower rib pain post colonoscopy  EXAM: RIGHT RIBS - 2 VIEW  COMPARISON:  11/15/2013 chest radiograph  FINDINGS: BB placed at site of symptoms at lower lateral RIGHT chest.  Osseous mineralization normal.  No rib fracture or bone destruction.  No focal osseous abnormalities identified at site of clinical concern.  Scattered endplate spur formation thoracic spine.  IMPRESSION: No RIGHT rib  abnormalities are radiographically identified.   Electronically Signed   By: Lavonia Dana M.D.   On: 09/02/2014 12:05   Ct Head Wo Contrast  09/02/2014   CLINICAL DATA:  Altered mental status after colonoscopy 1 day ago, possibly related to anesthesia, now worsening.  EXAM: CT HEAD WITHOUT CONTRAST  TECHNIQUE: Contiguous axial images were obtained from the base of the skull through the vertex without intravenous contrast.  COMPARISON:  CT of the head June 16, 2009  FINDINGS: The ventricles and sulci are normal for age. No intraparenchymal hemorrhage, mass effect nor midline shift. Patchy supratentorial white matter hypodensities are less than expected for patient's age and though non-specific suggest sequelae of chronic small vessel ischemic disease. No acute large vascular territory infarcts.  No abnormal extra-axial fluid collections. Basal cisterns are patent. Mild calcific atherosclerosis of the carotid siphons.  No skull fracture. The included ocular globes and orbital contents are non-suspicious. Status post bilateral ocular lens implants. The mastoid aircells and included paranasal sinuses are well-aerated. Patient is edentulous. Mild temporomandibular osteoarthrosis.  IMPRESSION: No acute intracranial process ; normal noncontrast CT of the head for age.   Electronically Signed   By: Elon Alas   On: 09/02/2014 21:00   Ct Abdomen Pelvis W Contrast  09/02/2014   CLINICAL DATA:  80 year old male with right abdominal and pelvic pain following colonoscopy yesterday. History of right hemicolectomy for polyp. Initial encounter.  EXAM: CT ABDOMEN AND PELVIS WITH CONTRAST  TECHNIQUE: Multidetector CT imaging of the abdomen and pelvis was performed using the standard protocol following bolus administration of intravenous contrast.  CONTRAST:  198mL OMNIPAQUE IOHEXOL 300 MG/ML  SOLN  COMPARISON:  None.  FINDINGS: Mild bibasilar atelectasis and cardiomegaly identified.  The gallbladder is distended with  wall thickening and adjacent inflammation compatible with acute cholecystitis. A possible stone in the cystic duct is identified.  The liver, spleen,  adrenal glands, kidneys, and pancreas are unremarkable.  There is no evidence of enlarged lymph nodes, biliary dilatation or abdominal aortic aneurysm.  A small amount of free fluid in the pelvis is present.  Evidence of right hemicolectomy noted. The bowel is otherwise unremarkable. There is no evidence of bowel obstruction, abscess or pneumoperitoneum.  The bladder is unremarkable.  No acute or suspicious bony abnormalities are identified.  Degenerative changes in the lumbar spine again identified.  IMPRESSION: Acute cholecystitis without biliary dilatation. Equivocal stone in the cystic duct identified.  Small amount of free pelvic fluid.  Cardiomegaly and mild bibasilar atelectasis.   Electronically Signed   By: Hassan Rowan M.D.   On: 09/02/2014 21:04   Dg Abd Acute W/chest  09/02/2014   CLINICAL DATA:  Acute epigastric and right-sided abdominal pain today. Shortness of breath.  EXAM: ACUTE ABDOMEN SERIES (ABDOMEN 2 VIEW & CHEST 1 VIEW)  COMPARISON:  Chest x-ray 09/02/2014  FINDINGS: The upright chest x-ray demonstrates stable cardiac enlargement. The mediastinal and hilar contours are stable. Low lung volumes with vascular crowding and atelectasis. Mild vascular congestion without overt pulmonary edema.  Two views of the abdomen demonstrate scattered air in the colon. There are air-filled small bowel loops air-fluid levels but no significant distention. Could not exclude an early or partial small bowel obstruction. No free air. The soft tissue shadows are maintained. The bony structures are intact.  IMPRESSION: 1. Stable cardiac enlargement. 2. Low lung volumes with vascular crowding, vascular congestion and bibasilar atelectasis. 3. Possible early or partial small bowel obstruction.   Electronically Signed   By: Kalman Jewels M.D.   On: 09/02/2014 18:00     Assessment/Plan Principal Problem:   Acute cholecystitis Active Problems:   Chronic atrial fibrillation   Hypertension   Chronic anticoagulation   Chronic diastolic heart failure   OSA (obstructive sleep apnea)   1. Acute cholecystitis  The patient is presenting with complaint of right upper quadrant pain associated with nausea and poor appetite with constipation. With this the patient was seen by his PCP and was found to have elevated bilirubin and was sent here for further workup. Ex line a CT of the abdomen is showing acute cholecystitis and the case was discussed with surgery. Patient will currently being admitted in the hospital under medical services. Currently he would be on IV antibiotics as per surgery with IV Zosyn. I would also gentle hydrating with IV fluids. He will remain nothing by mouth except medication.  2. chronic A. fib, chronic diastolic heart failure, elevated proBNP. Patient recently had a normal proBNP and currently his proBNP is elevated. I would gently hydrate the patient in view of his current nothing by mouth status as well as clinical dehydration. Monitor ins and outs as well as daily weight. Hold Lasix. Patient has taken Xarelto in the past. Currently I would hold it and he may require to stay off of Xarelto for 2 days prior to procedure.  3. obstructive sleep apnea. Continue C Pap.  4. history of hypertension. Continue home medications.   5. Acute and gastropathy. Workup so far is negative most likely secondary to infection. Continue serial neuro checks.   Advance goals of care discussion:  Full code   Consultgeneral surgery   DVT Prophylaxis: mechanical compression device, resume DVT prophylaxis after 2 days Nutrition: nothing by mouth  Family Communication:  family was present at bedside, opportunity was given to ask question and all questions were answered satisfactorily at the time of  interview. Disposition: Admitted to inpatient in  step-down unit.  Author: Berle Mull, MD [facility] Pager: [phone]  If 7PM-7AM, please contact night-coverage [email or web address] Password [email or web address]

## 2014-09-04 ENCOUNTER — Encounter (HOSPITAL_COMMUNITY): Payer: Self-pay | Admitting: Radiology

## 2014-09-04 ENCOUNTER — Inpatient Hospital Stay (HOSPITAL_COMMUNITY): Payer: Medicare Other

## 2014-09-04 DIAGNOSIS — K8063 Calculus of gallbladder and bile duct with acute cholecystitis with obstruction: Secondary | ICD-10-CM

## 2014-09-04 DIAGNOSIS — R739 Hyperglycemia, unspecified: Secondary | ICD-10-CM

## 2014-09-04 LAB — TROPONIN I: Troponin I: <0.3 ng/mL (ref <0.30)

## 2014-09-04 LAB — CBC
HEMATOCRIT: 34.5 % — AB (ref 39.0–52.0)
Hemoglobin: 11.7 g/dL — ABNORMAL LOW (ref 13.0–17.0)
MCH: 29 pg (ref 26.0–34.0)
MCHC: 33.9 g/dL (ref 30.0–36.0)
MCV: 85.6 fL (ref 78.0–100.0)
Platelets: 172 10*3/uL (ref 150–400)
RBC: 4.03 MIL/uL — ABNORMAL LOW (ref 4.22–5.81)
RDW: 14.7 % (ref 11.5–15.5)
WBC: 17.3 10*3/uL — AB (ref 4.0–10.5)

## 2014-09-04 LAB — COMPREHENSIVE METABOLIC PANEL
ALBUMIN: 2.4 g/dL — AB (ref 3.5–5.2)
ALT: 21 U/L (ref 0–53)
AST: 22 U/L (ref 0–37)
Alkaline Phosphatase: 87 U/L (ref 39–117)
Anion gap: 15 (ref 5–15)
BUN: 32 mg/dL — ABNORMAL HIGH (ref 6–23)
CALCIUM: 8.7 mg/dL (ref 8.4–10.5)
CO2: 22 mEq/L (ref 19–32)
Chloride: 100 mEq/L (ref 96–112)
Creatinine, Ser: 1.29 mg/dL (ref 0.50–1.35)
GFR calc non Af Amer: 51 mL/min — ABNORMAL LOW (ref >90)
GFR, EST AFRICAN AMERICAN: 60 mL/min — AB (ref >90)
Glucose, Bld: 153 mg/dL — ABNORMAL HIGH (ref 70–99)
Potassium: 3.8 mEq/L (ref 3.7–5.3)
Sodium: 137 mEq/L (ref 137–147)
Total Bilirubin: 1.8 mg/dL — ABNORMAL HIGH (ref 0.3–1.2)
Total Protein: 6.4 g/dL (ref 6.0–8.3)

## 2014-09-04 LAB — PROTIME-INR
INR: 2.36 — AB (ref 0.00–1.49)
Prothrombin Time: 26 seconds — ABNORMAL HIGH (ref 11.6–15.2)

## 2014-09-04 LAB — BILIRUBIN, DIRECT: BILIRUBIN DIRECT: 1.2 mg/dL — AB (ref 0.0–0.3)

## 2014-09-04 MED ORDER — FENTANYL CITRATE 0.05 MG/ML IJ SOLN
INTRAMUSCULAR | Status: AC
  Filled 2014-09-04: qty 2

## 2014-09-04 MED ORDER — FUROSEMIDE 10 MG/ML IJ SOLN
INTRAMUSCULAR | Status: AC
  Filled 2014-09-04: qty 4

## 2014-09-04 MED ORDER — FUROSEMIDE 10 MG/ML IJ SOLN
40.0000 mg | Freq: Once | INTRAMUSCULAR | Status: AC
  Administered 2014-09-04: 40 mg via INTRAVENOUS

## 2014-09-04 MED ORDER — MIDAZOLAM HCL 2 MG/2ML IJ SOLN
INTRAMUSCULAR | Status: AC | PRN
  Administered 2014-09-04: 0.5 mg via INTRAVENOUS
  Administered 2014-09-04: 1 mg via INTRAVENOUS
  Administered 2014-09-04: 0.5 mg via INTRAVENOUS

## 2014-09-04 MED ORDER — FENTANYL CITRATE 0.05 MG/ML IJ SOLN
INTRAMUSCULAR | Status: AC
  Filled 2014-09-04: qty 4

## 2014-09-04 MED ORDER — HYDROMORPHONE HCL 2 MG/ML IJ SOLN
INTRAMUSCULAR | Status: AC
  Filled 2014-09-04: qty 1

## 2014-09-04 MED ORDER — FENTANYL CITRATE 0.05 MG/ML IJ SOLN
25.0000 ug | INTRAMUSCULAR | Status: DC | PRN
  Administered 2014-09-04: 12.5 ug via INTRAVENOUS

## 2014-09-04 MED ORDER — FENTANYL CITRATE 0.05 MG/ML IJ SOLN
INTRAMUSCULAR | Status: AC | PRN
  Administered 2014-09-04 (×2): 25 ug via INTRAVENOUS
  Administered 2014-09-04: 50 ug via INTRAVENOUS

## 2014-09-04 MED ORDER — FENTANYL CITRATE 0.05 MG/ML IJ SOLN
INTRAMUSCULAR | Status: AC
Start: 2014-09-04 — End: 2014-09-04
  Filled 2014-09-04: qty 2

## 2014-09-04 MED ORDER — FENTANYL CITRATE 0.05 MG/ML IJ SOLN
50.0000 ug | INTRAMUSCULAR | Status: DC | PRN
  Administered 2014-09-04 (×3): 50 ug via INTRAVENOUS
  Administered 2014-09-04: 25 ug via INTRAVENOUS
  Administered 2014-09-04 (×2): 50 ug via INTRAVENOUS
  Administered 2014-09-04: 25 ug via INTRAVENOUS
  Administered 2014-09-04 – 2014-09-06 (×7): 50 ug via INTRAVENOUS
  Administered 2014-09-06 (×5): 100 ug via INTRAVENOUS
  Filled 2014-09-04 (×7): qty 2

## 2014-09-04 MED ORDER — LEVOFLOXACIN IN D5W 250 MG/50ML IV SOLN
250.0000 mg | INTRAVENOUS | Status: DC
Start: 2014-09-04 — End: 2014-09-05
  Administered 2014-09-04 – 2014-09-05 (×2): 250 mg via INTRAVENOUS
  Filled 2014-09-04 (×2): qty 50

## 2014-09-04 MED ORDER — IOHEXOL 300 MG/ML  SOLN
10.0000 mL | Freq: Once | INTRAMUSCULAR | Status: AC | PRN
  Administered 2014-09-04: 10 mL

## 2014-09-04 MED ORDER — IOHEXOL 300 MG/ML  SOLN
100.0000 mL | Freq: Once | INTRAMUSCULAR | Status: AC | PRN
  Administered 2014-09-04: 80 mL via INTRAVENOUS

## 2014-09-04 MED ORDER — METOPROLOL TARTRATE 12.5 MG HALF TABLET
12.5000 mg | ORAL_TABLET | Freq: Two times a day (BID) | ORAL | Status: DC
  Administered 2014-09-04 – 2014-09-05 (×2): 12.5 mg via ORAL

## 2014-09-04 MED ORDER — MIDAZOLAM HCL 2 MG/2ML IJ SOLN
INTRAMUSCULAR | Status: AC
  Filled 2014-09-04: qty 6

## 2014-09-04 MED ORDER — FENTANYL CITRATE 0.05 MG/ML IJ SOLN
12.5000 ug | INTRAMUSCULAR | Status: DC | PRN
  Administered 2014-09-04 (×2): 12.5 ug via INTRAVENOUS

## 2014-09-04 MED ORDER — METOPROLOL TARTRATE 12.5 MG HALF TABLET: 12.5000 mg | ORAL_TABLET | Freq: Two times a day (BID) | ORAL | Status: DC

## 2014-09-04 MED ORDER — FENTANYL CITRATE 0.05 MG/ML IJ SOLN
25.0000 ug | INTRAMUSCULAR | Status: DC | PRN
  Administered 2014-09-04: 25 ug via INTRAVENOUS

## 2014-09-04 MED ORDER — PIPERACILLIN-TAZOBACTAM 3.375 G IVPB
3.3750 g | Freq: Three times a day (TID) | INTRAVENOUS | Status: DC
  Administered 2014-09-04 – 2014-09-08 (×11): 3.375 g via INTRAVENOUS
  Filled 2014-09-04 (×10): qty 50

## 2014-09-04 MED ORDER — LIDOCAINE HCL 1 % IJ SOLN
INTRAMUSCULAR | Status: AC
  Filled 2014-09-04: qty 20

## 2014-09-04 NOTE — Sedation Documentation (Signed)
Pt became agitated, trying to sit up on the procedure table with dilation and placement of chole drain.  Pt more confused, unable to follow commands c/o severe pain at drain site.  Pt dyspneic and appearing air hungry.  Place to 100% NRB and given Dilaudid 0.5mg  IV.  Pt with bibasilar crackles and expiratory wheezing. Pt transferred to bed and taken back to ICU Room 1235 and report given to [place], [place]. Dr. Earleen Newport to get in touch with Dr. Lamonte Sakai and Dr. Rockne Menghini for plan of care.  Family updated by Dr. Earleen Newport.

## 2014-09-04 NOTE — Progress Notes (Signed)
Pt was agitated (?delerious, ?septic) earlier today.  Now is quiet and NAD but doesn't talk much. Now on CPAP(?)--had resp difficulty earlier.  WBC improved, T bili improved compared to yesterday. Other LFT's remain nl.  Remains febrile by rectal temp.  U/S shows 3.8 mm CBD w/out evid of stone.  Perc cholecystostomy placed earlier today--draining large amounts of dark bile---I don't see overt pus at present.  IMPR:  No evid of choledocholithiasis with a medically ill patient now s/p cholecystostomy  PLAN:   We will sign off, since the question of ?need for ERCP has been addressed. Call us back prn.  Pt will need close surgical f/u because if pt fails to show significant improvement over the next 24 hrs, having accomplished GB drainage, there may need to be consideration for surgical exploration if the patient's condition permits it.   Cleotis Nipper, M.D. [phone]

## 2014-09-04 NOTE — Progress Notes (Signed)
Patient ID: Cameron [name], male   DOB: Feb 21, 1934, 80 y.o.   MRN: [id number] F/u CT today post cholecystostomy revealed good placement of drain /decompressed GB; there are persistent pericholecystic inflammatory changes. There is new small amount of perihepatic fluid which is low density likely reflecting bile or simple fluid secondary to bile peritonitis. New small right pleural effusion and compressive atelectasis.Trace left pleural effusion and mild atelectasis. No pneumothorax. Findings d/w family/CCS. Will cont to monitor pt.

## 2014-09-04 NOTE — Progress Notes (Signed)
RN removed NIV mask and placed patient on 6L nasal cannula. Humidifier applied. Patient continues to appear anxious at this time, but does seem to be conversing much more nicely. VSS. Continues to complain of dryness, and states the ventilator "smothers" him.

## 2014-09-04 NOTE — Progress Notes (Signed)
He is much more comfortable, now than he was when we ordered the CT.  He is also off Bipap and sating 100% on nasal cannula.   Ct showed:   Interval percutaneous cholecystostomy tube placement with the tip of the catheter in the gallbladder neck. The gallbladder is decompressed. There are persistent pericholecystic inflammatory changes. There is new small amount of perihepatic fluid which is low density likely reflecting bile or simple fluid secondary to bile Peritonitis. Labs ordered for the AM.

## 2014-09-04 NOTE — Progress Notes (Signed)
I heard patient yell "help" at 0554 from nurses desk directly outside of the room. Myself and another nurse went into the room to assess the patients needs. The patient appeared to be confused and stated that he was "dying". After further questioning, I determined the patient was in pain and retrieved PRN Dilauded and administered 1mg  via IV at 0558 followed by a 67mL NS flush. I stayed with the patient to ensure the medication was effective. When asked if his pain was relieved, the patient became further irritated and refused to answer questions about his pain and neurological status questions. I attempted to calm the patient by speaking kindly and explaining that I had given him medication, but found this to be ineffective. I asked the patient if he had any further needs and returned to the nurses desk. The patient is currently being monitored from the nurses desk and appears to have experienced relief from his pain. Will continue to monitor pain and neurological status. Luther Parody, RN

## 2014-09-04 NOTE — Progress Notes (Signed)
Subjective: He is very confused and having a hard time voiding.  He was confused most of the night according to some.    Objective: Vital signs in last 24 hours: Temp:  [98.1 F (36.7 C)-101.3 F (38.5 C)] 98.1 F (36.7 C) (12/04 0400) Pulse Rate:  [73-137] 93 (12/04 0600) Resp:  [13-33] 21 (12/04 0600) BP: (106-146)/(56-97) 143/66 mmHg (12/04 0600) SpO2:  [90 %-100 %] 95 % (12/04 0600) Weight:  [94.4 kg (208 lb 1.8 oz)] 94.4 kg (208 lb 1.8 oz) (12/04 0400)    Fever is better, I don't see one since 7 AM yesterday HR up some, SAts OK WBC is improving creatinine is going up some Intake/Output from previous day: 12/03 0701 - 12/04 0700 In: 576 [P.O.:20; I.V.:6; IV Piggyback:550] Out: 425 [Urine:425] Intake/Output this shift:    General appearance: alert and confused, and not really understanding what is occuring, upset with hospital and staff. Resp: clear to auscultation bilaterally and anterior, no wheezing this Am GI: soft, tender Right side and going into his back  Lab Results:   Recent Labs  09/03/14 0751 09/04/14 0437  WBC 21.0* 17.3*  HGB 12.6* 11.7*  HCT 36.7* 34.5*  PLT 147* 172    BMET  Recent Labs  09/03/14 0751 09/04/14 0437  NA 135* 137  K 4.0 3.8  CL 96 100  CO2 22 22  GLUCOSE 135* 153*  BUN 18 32*  CREATININE 1.17 1.29  CALCIUM 8.9 8.7   PT/INR  Recent Labs  09/03/14 1601 09/04/14 0437  LABPROT 30.1* 26.0*  INR 2.85* 2.36*     Recent Labs Lab 09/02/14 1706 09/03/14 0751 09/04/14 0437  AST 39* 32 22  ALT 26 26 21   ALKPHOS 94 92 87  BILITOT 4.0* 4.0* 1.8*  PROT 7.2 7.0 6.4  ALBUMIN 2.9* 2.7* 2.4*     Lipase     Component Value Date/Time   LIPASE 40 09/03/2014 0751     Studies/Results: Dg Chest 2 View  09/02/2014   CLINICAL DATA:  Shortness of breath 1 day after colonoscopy. Right anterior chest pain and right lower rib pain.  EXAM: CHEST  2 VIEW  COMPARISON:  11/15/2013  FINDINGS: The heart is enlarged but stable.  There is tortuosity, ectasia and calcification of the thoracic aorta. There are chronic bronchitic lung changes but no acute pulmonary findings. Low lung volumes with vascular crowding and streaky basilar atelectasis. No pleural effusion or pneumothorax. No free air is identified under the hemidiaphragms. The bony thorax is intact.  IMPRESSION: 1. Mild stable cardiac enlargement. 2. Low lung volumes with vascular crowding and streaky bibasilar atelectasis. 3. No pneumothorax or free intra-abdominal air.   Electronically Signed   By: Kalman Jewels M.D.   On: 09/02/2014 12:09   Dg Ribs Unilateral Right  09/02/2014   CLINICAL DATA:  Shortness of breath for 1 day, RIGHT anterior chest and RIGHT lower rib pain post colonoscopy  EXAM: RIGHT RIBS - 2 VIEW  COMPARISON:  11/15/2013 chest radiograph  FINDINGS: BB placed at site of symptoms at lower lateral RIGHT chest.  Osseous mineralization normal.  No rib fracture or bone destruction.  No focal osseous abnormalities identified at site of clinical concern.  Scattered endplate spur formation thoracic spine.  IMPRESSION: No RIGHT rib abnormalities are radiographically identified.   Electronically Signed   By: Lavonia Dana M.D.   On: 09/02/2014 12:05   Ct Head Wo Contrast  09/02/2014   CLINICAL DATA:  Altered mental status after colonoscopy  1 day ago, possibly related to anesthesia, now worsening.  EXAM: CT HEAD WITHOUT CONTRAST  TECHNIQUE: Contiguous axial images were obtained from the base of the skull through the vertex without intravenous contrast.  COMPARISON:  CT of the head June 16, 2009  FINDINGS: The ventricles and sulci are normal for age. No intraparenchymal hemorrhage, mass effect nor midline shift. Patchy supratentorial white matter hypodensities are less than expected for patient's age and though non-specific suggest sequelae of chronic small vessel ischemic disease. No acute large vascular territory infarcts.  No abnormal extra-axial fluid  collections. Basal cisterns are patent. Mild calcific atherosclerosis of the carotid siphons.  No skull fracture. The included ocular globes and orbital contents are non-suspicious. Status post bilateral ocular lens implants. The mastoid aircells and included paranasal sinuses are well-aerated. Patient is edentulous. Mild temporomandibular osteoarthrosis.  IMPRESSION: No acute intracranial process ; normal noncontrast CT of the head for age.   Electronically Signed   By: Elon Alas   On: 09/02/2014 21:00   Ct Abdomen Pelvis W Contrast  09/02/2014   CLINICAL DATA:  80 year old male with right abdominal and pelvic pain following colonoscopy yesterday. History of right hemicolectomy for polyp. Initial encounter.  EXAM: CT ABDOMEN AND PELVIS WITH CONTRAST  TECHNIQUE: Multidetector CT imaging of the abdomen and pelvis was performed using the standard protocol following bolus administration of intravenous contrast.  CONTRAST:  115mL OMNIPAQUE IOHEXOL 300 MG/ML  SOLN  COMPARISON:  None.  FINDINGS: Mild bibasilar atelectasis and cardiomegaly identified.  The gallbladder is distended with wall thickening and adjacent inflammation compatible with acute cholecystitis. A possible stone in the cystic duct is identified.  The liver, spleen, adrenal glands, kidneys, and pancreas are unremarkable.  There is no evidence of enlarged lymph nodes, biliary dilatation or abdominal aortic aneurysm.  A small amount of free fluid in the pelvis is present.  Evidence of right hemicolectomy noted. The bowel is otherwise unremarkable. There is no evidence of bowel obstruction, abscess or pneumoperitoneum.  The bladder is unremarkable.  No acute or suspicious bony abnormalities are identified.  Degenerative changes in the lumbar spine again identified.  IMPRESSION: Acute cholecystitis without biliary dilatation. Equivocal stone in the cystic duct identified.  Small amount of free pelvic fluid.  Cardiomegaly and mild bibasilar  atelectasis.   Electronically Signed   By: Hassan Rowan M.D.   On: 09/02/2014 21:04   Dg Abd Acute W/chest  09/02/2014   CLINICAL DATA:  Acute epigastric and right-sided abdominal pain today. Shortness of breath.  EXAM: ACUTE ABDOMEN SERIES (ABDOMEN 2 VIEW & CHEST 1 VIEW)  COMPARISON:  Chest x-ray 09/02/2014  FINDINGS: The upright chest x-ray demonstrates stable cardiac enlargement. The mediastinal and hilar contours are stable. Low lung volumes with vascular crowding and atelectasis. Mild vascular congestion without overt pulmonary edema.  Two views of the abdomen demonstrate scattered air in the colon. There are air-filled small bowel loops air-fluid levels but no significant distention. Could not exclude an early or partial small bowel obstruction. No free air. The soft tissue shadows are maintained. The bony structures are intact.  IMPRESSION: 1. Stable cardiac enlargement. 2. Low lung volumes with vascular crowding, vascular congestion and bibasilar atelectasis. 3. Possible early or partial small bowel obstruction.   Electronically Signed   By: Kalman Jewels M.D.   On: 09/02/2014 18:00   US Abdomen Limited Ruq  09/03/2014   CLINICAL DATA:  Elevated bilirubin level.  EXAM: US ABDOMEN LIMITED - RIGHT UPPER QUADRANT  COMPARISON:  CT  scan of September 02, 2014.  FINDINGS: Gallbladder:  Small gallstone is noted in neck of gallbladder, with sludge present within gallbladder lumen. Gallbladder wall is mildly thickened at 4.7 mm without definite pericholecystic fluid. No sonographic Murphy's sign is noted.  Common bile duct:  Diameter: 3.5 mm which is within normal limits.  Liver:  No focal abnormality is noted. Increased echogenicity is noted suggesting fatty infiltration. Small amount of free fluid is seen adjacent to the liver.  IMPRESSION: Solitary gallstone is noted in the neck of the gallbladder with sludge present within gallbladder lumen. Mild gallbladder wall thickening is noted and these findings may  represent acute cholecystitis. HIDA scan may be performed for further evaluation.  Fatty infiltration of the liver is noted. Small amount of free fluid is seen adjacent to the right hepatic lobe.   Electronically Signed   By: Sabino Dick M.D.   On: 09/03/2014 21:25    Medications: . doxazosin  4 mg Oral Daily  . metoprolol succinate  25 mg Oral q morning - 10a  . piperacillin-tazobactam (ZOSYN)  IV  3.375 g Intravenous Q8H  . sodium chloride  3 mL Intravenous Q12H    Assessment/Plan 1. Cholecystitis, cholelithiasis  2. Elevated LFT's/mild pancreatitis 3.  Chronic atrial fibrillation on chronic anitcoagulation He is unsure of the last dose, may have been yesterday. 09/02/14. 4.  Chronic diastolic heart failure 5.  OSA/ moderate with AHI 17.53/hr now on CPAP at 7cm H2O  6.  hypertension 7.  Peripheral neuropathy 8.  Gout 9.  B 12 deficiency  10. Arthritis    Plan:  IR drain today, continue antibiotics.        LOS: 2 days    Cameron [name] 09/04/2014

## 2014-09-04 NOTE — Progress Notes (Signed)
Progress Note   Cameron [name] ZCH:[id number] DOB: 10/10/1933 DOA: 09/02/2014 PCP: Simona Huh, MD   Brief Narrative:   Cameron [name] is an 80 y.o. male with a PMH of hypertension, OSA, neuropathy, atrial fibrillation on chronic Xarelto and chronic diastolic heart failure who was admitted 09/02/14 with a chief complaint of RUQ pain and reports of confusion. He saw his PCP and was found to have hyperbilirubinemia. A CT scan of the abdomen, done on admission, showed acute cholecystitis. Surgery has been consulted.  Assessment/Plan:   Principal Problem:   Acute cholecystitis with hyperlipasemia and hyperbilirubinemia/cholelithiasis/choledocholithiasis  CT findings as noted below. Stone noted in the cystic duct.  Continue empiric IV Zosyn.  S/P surgical consultation /GI consultations.  Dr. Cristina Gong does not think he has choledocholithiasis and does not recommend ERCP.  S/P U/S which showed a stone in the gallbladder neck, normal bile duct caliber.  For cholecystotomy.  Active Problems:   Hyperglycemia  Hemoglobin A1c 6%, corresponding to a mean plasma glucose of 126. The patient is at risk for developing type 2 diabetes.    Confusion  Continue neuro checks.  CT of the head negative.    Chronic atrial fibrillation / chronic anticoagulation  Xarelto on hold.  Heart rate 93-104.  Continue metoprolol. Will add IV metoprolol when necessary heart rate greater than 100.    Hypertension  Continue Cardura and metoprolol.    Chronic diastolic heart failure / elevated BNP  The patient is currently nothing by mouth with gentle IV fluids going.  Lasix on hold.  Monitor fluid volume status closely.    OSA (obstructive sleep apnea)  Continue C Pap daily at bedtime.    DVT Prophylaxis  Deferred in anticipation of impending surgery.  Code Status: Full. Family Communication: Son, Cameron [name] updated. Disposition Plan: Home when stable.   IV Access:     Peripheral IV   Procedures and diagnostic studies:   Dg Chest 2 View 09/02/2014: 1. Mild stable cardiac enlargement. 2. Low lung volumes with vascular crowding and streaky bibasilar atelectasis. 3. No pneumothorax or free intra-abdominal air.     Dg Ribs Unilateral Right 09/02/2014: No RIGHT rib abnormalities are radiographically identified.     Ct Head Wo Contrast 09/02/2014: No acute intracranial process ; normal noncontrast CT of the head for age.     Ct Abdomen Pelvis W Contrast 09/02/2014: Acute cholecystitis without biliary dilatation. Equivocal stone in the cystic duct identified.  Small amount of free pelvic fluid.  Cardiomegaly and mild bibasilar atelectasis.     Dg Abd Acute W/chest 09/02/2014: 1. Stable cardiac enlargement. 2. Low lung volumes with vascular crowding, vascular congestion and bibasilar atelectasis. 3. Possible early or partial small bowel obstruction.     US Abdomen Limited Ruq 09/03/2014: Solitary gallstone is noted in the neck of the gallbladder with sludge present within gallbladder lumen. Mild gallbladder wall thickening is noted and these findings may represent acute cholecystitis. HIDA scan may be performed for further evaluation.  Fatty infiltration of the liver is noted. Small amount of free fluid is seen adjacent to the right hepatic lobe.      Medical Consultants:    Dr. Donnie Mesa, Surgery  Dr. Ronald Lobo, GI  Anti-Infectives:    Vancomycin 09/02/14---> 09/02/14  Zosyn 09/02/14--->   Subjective:   Cameron [name] was confused and disoriented through the night, having pain.  No N/V.  Objective:    Filed Vitals:   09/04/14 0300 09/04/14 0400 09/04/14 0500  09/04/14 0600  BP: 121/85 106/67 121/71 143/66  Pulse: 94 104 96 93  Temp:  98.1 F (36.7 C)    TempSrc:  Oral    Resp: 27 33 18 21  Height:      Weight:  94.4 kg (208 lb 1.8 oz)    SpO2: 97% 97% 97% 95%    Intake/Output Summary (Last 24 hours) at 09/04/14 0728 Last data  filed at 09/04/14 0554  Gross per 24 hour  Intake    576 ml  Output    425 ml  Net    151 ml    Exam: Gen:  NAD, weak Cardiovascular:  HSIR, No M/R/G Respiratory:  Lungs CTAB Gastrointestinal:  Abdomen soft, RUQ pain, + BS Extremities:  1+ edema   Data Reviewed:    Labs: Basic Metabolic Panel:  Recent Labs Lab 09/02/14 1706 09/03/14 0751 09/04/14 0437  NA 132* 135* 137  K 3.7 4.0 3.8  CL 92* 96 100  CO2 20 22 22   GLUCOSE 193* 135* 153*  BUN 14 18 32*  CREATININE 1.07 1.17 1.29  CALCIUM 8.9 8.9 8.7   GFR Estimated Creatinine Clearance: 54.5 mL/min (by C-G formula based on Cr of 1.29). Liver Function Tests:  Recent Labs Lab 09/02/14 1706 09/03/14 0751 09/04/14 0437  AST 39* 32 22  ALT 26 26 21   ALKPHOS 94 92 87  BILITOT 4.0* 4.0* 1.8*  PROT 7.2 7.0 6.4  ALBUMIN 2.9* 2.7* 2.4*    Recent Labs Lab 09/02/14 1706 09/03/14 0751  LIPASE 145* 40   CBC:  Recent Labs Lab 09/02/14 1706 09/03/14 0751 09/04/14 0437  WBC 19.2* 21.0* 17.3*  NEUTROABS 16.7*  --   --   HGB 12.6* 12.6* 11.7*  HCT 37.5* 36.7* 34.5*  MCV 85.0 85.0 85.6  PLT 149* 147* 172   BNP (last 3 results)  Recent Labs  08/19/14 0851 09/02/14 1706  PROBNP 26.0 800.4*   Sepsis Labs:  Recent Labs Lab 09/02/14 1706 09/02/14 1717 09/03/14 0751 09/04/14 0437  WBC 19.2*  --  21.0* 17.3*  LATICACIDVEN  --  1.85  --   --    Microbiology Recent Results (from the past 240 hour(s))  Culture, blood (routine x 2)     Status: None (Preliminary result)   Collection Time: 09/02/14  5:07 PM  Result Value Ref Range Status   Specimen Description BLOOD BLOOD LEFT FOREARM  Final   Special Requests BOTTLES DRAWN AEROBIC AND ANAEROBIC 5 CC  Final   Culture  Setup Time   Final    09/02/2014 23:04 Performed at [facility]    Culture   Final           BLOOD CULTURE RECEIVED NO GROWTH TO DATE CULTURE WILL BE HELD FOR 5 DAYS BEFORE ISSUING A FINAL NEGATIVE REPORT Performed at [facility]    Report Status PENDING  Incomplete  Culture, blood (routine x 2)     Status: None (Preliminary result)   Collection Time: 09/02/14  5:07 PM  Result Value Ref Range Status   Specimen Description BLOOD BLOOD RIGHT FOREARM  Final   Special Requests BOTTLES DRAWN AEROBIC AND ANAEROBIC 5 CC  Final   Culture  Setup Time   Final    09/02/2014 23:04 Performed at [facility]    Culture   Final           BLOOD CULTURE RECEIVED NO GROWTH TO DATE CULTURE WILL BE HELD FOR 5 DAYS BEFORE ISSUING  A FINAL NEGATIVE REPORT Performed at [facility]    Report Status PENDING  Incomplete  MRSA PCR Screening     Status: Abnormal   Collection Time: 09/03/14 12:27 AM  Result Value Ref Range Status   MRSA by PCR INVALID RESULTS, SPECIMEN SENT FOR CULTURE (A) NEGATIVE Final    Comment: SPOKE WITH CROFPS,S RN [id number] COVINGTON,N        The GeneXpert MRSA Assay (FDA approved for NASAL specimens only), is one component of a comprehensive MRSA colonization surveillance program. It is not intended to diagnose MRSA infection nor to guide or monitor treatment for MRSA infections.      Medications:   . doxazosin  4 mg Oral Daily  . metoprolol succinate  25 mg Oral q morning - 10a  . piperacillin-tazobactam (ZOSYN)  IV  3.375 g Intravenous Q8H  . sodium chloride  3 mL Intravenous Q12H   Continuous Infusions:    Time spent: 35 minutes with > 50% of time discussing current diagnostic test results, clinical impression and plan of care with the patient, his son, coordination of care with multiple specialists.    LOS: 2 days   RAMA,CHRISTINA  [facility] Pager [phone]. If unable to reach me by pager, please call my cell phone at [phone].  *Please refer to [email or web address], password [email or web address] to get updated schedule on who will round on this patient, as hospitalists switch teams weekly. If 7PM-7AM, please contact night-coverage at [email or web address], password [email or web address] for any  overnight needs.  09/04/2014, 7:28 AM

## 2014-09-04 NOTE — Progress Notes (Addendum)
Reassessed at bedside.  Patient taken off bipap to allow for a break.  After removal, patient placed on  O2.  He complains of dry mouth, significant R sided pain and pain in his back.  Modest relief from fentanyl IV despite increasing doses.  Drain appears patent.  Mild increase in work of breathing off bipap.  Appears delirious.  Pain appears out of proportion to procedure performed / pt uncomfortable.  VSS. RUQ tenderness on exam.   Filed Vitals:   09/04/14 1225 09/04/14 1230 09/04/14 1300 09/04/14 1400  BP:  135/88 136/59 112/78  Pulse:  88 111 133  Temp:      TempSrc:      Resp: 26 19 20 27   Height:      Weight:      SpO2:  100% 100% 99%        Plan: Increase fentanyl to 50-100 mcg Q1 hour PRN  KUB now Assess CT abd/pelvis to review drain placement, r/o perf, bleeding (xarelto hx) etc PRN bipap for increased WOB.      Noe Gens, NP-C [facility] Pgr: [phone] or [phone]

## 2014-09-04 NOTE — Progress Notes (Signed)
Patient complains of oral dryness. Removed from NIV. Patient immediately began to curse and yell stating "you're just gonna watch me die" and "I can't breath". He appears extremely anxious, rolling from left to right in the bed and asking that his son be called. He then demanded the NIV mask be placed back on his face and proceeded to call RT "stupid". Mask reapplied to patients face. Anxiety continues and he complains of pain RN aware of patient disposition and pain complaints. Constant alarms sounding from ventilator due to his intolerance and anxiety despite titration of alarm settings. RT to continue to follow.

## 2014-09-04 NOTE — Progress Notes (Addendum)
Patient ID: Cameron [name], male   DOB: 01-31-1934, 80 y.o.   MRN: [id number] . This was written on the wrong patient

## 2014-09-04 NOTE — Procedures (Signed)
Interventional Radiology Procedure Note  Procedure: Percutaneous cholecystostomy tube.  Indication:  Acute Cholecystitis.  Poor surgical candidate.  Findings: Gall bladder under pressure.  Dark bile.  No bleeding.  Complications: No technical complications. Peri-operative bacteremia/sepsis vs paradoxical reaction to sedation, with altered mental status Sample sent to lab for analysis.  Recommendations:  - Suggest CXR, possible cardiac work up - Suggest Sepsis workup - continue anti-bx  Discussed with Dr. Rockne Menghini.   Signed,  Dulcy Fanny. Earleen Newport, DO

## 2014-09-04 NOTE — Consult Note (Signed)
PULMONARY / CRITICAL CARE MEDICINE   Name: Cameron [name] MRN: [id number] DOB: [birth year]    ADMISSION DATE:  09/02/2014 CONSULTATION DATE:  09/04/14  REFERRING MD :  Dr. Rockne Menghini   CHIEF COMPLAINT:  SOB  INITIAL PRESENTATION:  80 y/o M admitted 12/2 with abdominal pain and confusion. Work up positive for acute cholecystitis and patient underwent perc drain placement on 12/4 per IR.  Patient developed decreased mental status & SOB during procedure and PCCM consulted for evaluation.    STUDIES:  12/02  CT HEAD >> no acute intracranial process 12/02  CT ABD/Pelvis >> acute cholecystitis without biliary dilation, equivocal stone in cystic duct, cardiomegaly & mild bibasilar atx  SIGNIFICANT EVENTS: 12/02  Admit with acute cholecystitis 12/04  IR perc cholecystitis drain placement.  Decompensated during procedure becoming SOB with decreased mental status   HISTORY OF PRESENT ILLNESS: 80 y/o M with a PMH of Gout, B12 Deficiency, HTN, Chronic back pain, Anemia, AFIB on Xarelto, and OSA on CPAP who presented to [facility] on 12/2 with abdominal pain and confusion. On 12/1, he underwent routine colonoscopy.  During prep for procedure he developed lower abdominal pain but continued with planned procedure.  Initial ER presentation, he complained of RLQ abd pain, decreased mental status (per son), nystagmus, SOB and chest pain.  EKG with Afib with RVR in 140's.  He was found to have fever of 102 & hypertension.  CT head was negative for acute process and CT abd/pelvis was positive for acute cholecystitis.  The patient was evaluated by CCS and conservative approach favored for management.  He underwent perc drain placement on 12/4 per IR.  During the procedure he developed decreased mental status & SOB.  He was transferred to SDU and PCCM consulted for evaluation.    PAST MEDICAL HISTORY :   has a past medical history of Rosacea; Gout; Vitamin B12 deficiency; Arthritis; Neuromuscular disorder;  Peripheral neuropathy; Hypertension; Chronic anticoagulation; Low back pain; Anemia; Swelling; OSA (obstructive sleep apnea); Chronic atrial fibrillation; and Acute cholecystitis (09/02/2014).  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   HOME MEDICATIONS: Prior to Admission medications   Medication Sig Start Date End Date Taking? Authorizing Provider  bimatoprost (LUMIGAN) 0.01 % SOLN Place 1 drop into both eyes at bedtime.    Yes Historical Provider, MD  bisacodyl (DULCOLAX) 5 MG EC tablet Take 5 mg by mouth. Take as directed.   Yes Historical Provider, MD  cyanocobalamin (,VITAMIN B-12,) 1000 MCG/ML injection Inject 1,000 mcg into the muscle every 30 (thirty) days.    Yes Historical Provider, MD  rivaroxaban (XARELTO) 20 MG TABS tablet Take 20 mg by mouth daily with supper.   Yes Historical Provider, MD  doxazosin (CARDURA) 4 MG tablet TAKE 1 TABLET BY MOUTH EVERY DAY    Traci R Turner, MD  fish oil-omega-3 fatty acids 1000 MG capsule Take 1 g by mouth 2 (two) times daily.     Historical Provider, MD  folic acid (FOLVITE) 1 MG tablet Take 1 mg by mouth daily.     Historical Provider, MD  furosemide (LASIX) 40 MG tablet TAKE 1 TABLET BY MOUTH EVERY DAY AND EXTRA AS DIRECTED    Sueanne Margarita, MD  meloxicam (MOBIC) 15 MG tablet Take 15 mg by mouth daily as needed for pain.  Historical Provider, MD  metoprolol succinate (TOPROL-XL) 25 MG 24 hr tablet Take 25 mg by mouth every morning.    Historical Provider, MD  traMADol (ULTRAM) 50 MG tablet Take 50 mg by mouth every 4 (four) hours as needed for moderate pain or severe pain. Maximum dose= 8 tablets per day    Historical Provider, MD   Allergies  Allergen Reactions  . Ace  Inhibitors     Lips inflated to about "20 pound"  . Clonidine Derivatives Rash    FAMILY HISTORY:  indicated that his mother is deceased. He indicated that his father is deceased. He indicated that only one of his two sisters is alive. He indicated that two of his three brothers are alive.    SOCIAL HISTORY:  reports that he quit smoking about 39 years ago. His smoking use included Cigarettes. He smoked 2.00 packs per day. He has never used smokeless tobacco. He reports that he drinks alcohol. He reports that he does not use illicit drugs.  REVIEW OF SYSTEMS:  Unable to complete as patient on bipap.  Information obtained from previous medical documentation and family at bedside.    SUBJECTIVE:   VITAL SIGNS: Temp:  [98.1 F (36.7 C)-101.4 F (38.6 C)] 101.4 F (38.6 C) (12/04 0800) Pulse Rate:  [73-163] 163 (12/04 1007) Resp:  [13-34] 20 (12/04 1007) BP: (106-156)/(56-101) 156/91 mmHg (12/04 0936) SpO2:  [93 %-100 %] 99 % (12/04 1007) Weight:  [208 lb 1.8 oz (94.4 kg)] 208 lb 1.8 oz (94.4 kg) (12/04 0400)   HEMODYNAMICS:   VENTILATOR SETTINGS:   INTAKE / OUTPUT:  Intake/Output Summary (Last 24 hours) at 09/04/14 1038 Last data filed at 09/04/14 1012  Gross per 24 hour  Intake    483 ml  Output    875 ml  Net   -392 ml    PHYSICAL EXAMINATION: General:  wdwn elderly male in NAD Neuro:  Awake, alert, interacts appropriately  HEENT:  Mm pink/moist, bipap mask in place Cardiovascular:  s1s2 irr irr, rate 80's-110's, no m/r/g Lungs:  resp's even/non-labored on BiPAP, lungs bilaterally diminished but clear Abdomen:  Mildly tender on R side, drain in place with brown drainage Musculoskeletal:  No acute deformities  Skin:  Warm/dry, no edema, no rashes or lesions  LABS:  CBC  Recent Labs Lab 09/02/14 1706 09/03/14 0751 09/04/14 0437  WBC 19.2* 21.0* 17.3*  HGB 12.6* 12.6* 11.7*  HCT 37.5* 36.7* 34.5*  PLT 149* 147* 172   Coag's  Recent Labs Lab  09/03/14 1601 09/03/14 1615 09/04/14 0437  APTT  --  48*  --   INR 2.85*  --  2.36*   BMET  Recent Labs Lab 09/02/14 1706 09/03/14 0751 09/04/14 0437  NA 132* 135* 137  K 3.7 4.0 3.8  CL 92* 96 100  CO2 20 22 22   BUN 14 18 32*  CREATININE 1.07 1.17 1.29  GLUCOSE 193* 135* 153*   Electrolytes  Recent Labs Lab 09/02/14 1706 09/03/14 0751 09/04/14 0437  CALCIUM 8.9 8.9 8.7   Sepsis Markers  Recent Labs Lab 09/02/14 1717  LATICACIDVEN 1.85   Liver Enzymes  Recent Labs Lab 09/02/14 1706 09/03/14 0751 09/04/14 0437  AST 39* 32 22  ALT 26 26 21   ALKPHOS 94 92 87  BILITOT 4.0* 4.0* 1.8*  ALBUMIN 2.9* 2.7* 2.4*   Cardiac Enzymes  Recent Labs Lab 09/02/14 1706  PROBNP 800.4*   Glucose No results for input(s): GLUCAP in the last 168 hours.  Imaging  US Abdomen Limited Ruq  09/03/2014   CLINICAL DATA:  Elevated bilirubin level.  EXAM: US ABDOMEN LIMITED - RIGHT UPPER QUADRANT  COMPARISON:  CT scan of September 02, 2014.  FINDINGS: Gallbladder:  Small gallstone is noted in neck of gallbladder, with sludge present within gallbladder lumen. Gallbladder wall is mildly thickened at 4.7 mm without definite pericholecystic fluid. No sonographic Murphy's sign is noted.  Common bile duct:  Diameter: 3.5 mm which is within normal limits.  Liver:  No focal abnormality is noted. Increased echogenicity is noted suggesting fatty infiltration. Small amount of free fluid is seen adjacent to the liver.  IMPRESSION: Solitary gallstone is noted in the neck of the gallbladder with sludge present within gallbladder lumen. Mild gallbladder wall thickening is noted and these findings may represent acute cholecystitis. HIDA scan may be performed for further evaluation.  Fatty infiltration of the liver is noted. Small amount of free fluid is seen adjacent to the right hepatic lobe.   Electronically Signed   By: Sabino Dick M.D.   On: 09/03/2014 21:25     ASSESSMENT /  PLAN:  PULMONARY OETT n/a A: Atelectasis - in setting of pain Respiratory Distress - brief post IR procedure  OSA - on CPAP at home P:   BiPAP PRN for increased WOB with condition of good mental status.  Transition back to nocturnal CPAP once out of acute phase Pulmonary hygiene:  IS, mobilize Now CXR to r/o pulmonary edema, infiltrate  CARDIOVASCULAR CVL n/a A:  Severe Sepsis - in setting of acute cholecystis Chest Pain - on admit, neg troponin and EKG. pBNP 800.  Hypertension  CAF - on xarelto  P:  Reduce dose of lopressor to 12.5 BID, hold for SBP <110  Monitor BP closely, may need to d/c lopressor all together Plan to resume xarelto 12/5 pm  Continue Cardura Troponin x1   RENAL A:   No acute issues  P:   Trend BMP Replace electrolytes as indicated  GASTROINTESTINAL A:   Abdominal Pain  Acute Cholecystitis  P:   Per GI, CCS See ID NPO while on bipap, ok to resume orals once off with good mental status  HEMATOLOGIC A:   Mild Anemia  Chronic Anti-coagulation  P:  Monitor CBC Hold Xarelto 12/4   INFECTIOUS A:   Severe Sepsis - acute cholecystis + possible UTI (? contaminated source given recent rectal manipulation with colonoscopy) P:   BCx2 12/2 >> BCx2 12/4 >>  Gallbladder Fluid 12/4 >>  Abx: Zosyn, start date 12/2, day 3/x Abx: Vanco, start date 12/2 x1   Abx: Levaquin 12/4 x 1  Monitor drain output Assess rectal temps, as pt's oral temps have been inaccurate  ENDOCRINE A:   No acute issues  P:   Monitor glucose on BMP  NEUROLOGIC A:   Pain  Arthritis  P:   Change to fentanyl for pain control  Minimize sedating medications as able    FAMILY  - Updates: Updated family extensively at bedside 12/4. Full code.     Noe Gens, NP-C  Pulmonary & Critical Care Pgr: [phone] or [phone] 09/04/2014, 10:38 AM  Attending Note:  I have examined the patient, reviewed the labs, studies, notes. I have discussed the case with B  Ollis and agree with data, plans as amended above. He appears more comfortable on BiPAP. Suspect that his decompensation in breathing and mental status are related to sepsis, sedating medications, underlying obstructive sleep apnea. Will support him with BiPAP, and follow.  My independent CC time is 45 minutes.   Baltazar Apo, MD, PhD 09/04/2014, 5:52 PM [place] [facility] [phone] or if no answer [phone]

## 2014-09-05 ENCOUNTER — Encounter (HOSPITAL_COMMUNITY): Payer: Self-pay | Admitting: Internal Medicine

## 2014-09-05 ENCOUNTER — Inpatient Hospital Stay (HOSPITAL_COMMUNITY): Payer: Medicare Other

## 2014-09-05 DIAGNOSIS — I5033 Acute on chronic diastolic (congestive) heart failure: Secondary | ICD-10-CM

## 2014-09-05 DIAGNOSIS — R7301 Impaired fasting glucose: Secondary | ICD-10-CM

## 2014-09-05 DIAGNOSIS — R109 Unspecified abdominal pain: Secondary | ICD-10-CM | POA: Insufficient documentation

## 2014-09-05 DIAGNOSIS — J9601 Acute respiratory failure with hypoxia: Secondary | ICD-10-CM

## 2014-09-05 DIAGNOSIS — E876 Hypokalemia: Secondary | ICD-10-CM | POA: Diagnosis present

## 2014-09-05 DIAGNOSIS — R1011 Right upper quadrant pain: Secondary | ICD-10-CM

## 2014-09-05 DIAGNOSIS — J9600 Acute respiratory failure, unspecified whether with hypoxia or hypercapnia: Secondary | ICD-10-CM | POA: Diagnosis present

## 2014-09-05 HISTORY — DX: Impaired fasting glucose: R73.01

## 2014-09-05 LAB — MRSA CULTURE

## 2014-09-05 LAB — CBC
HCT: 34.2 % — ABNORMAL LOW (ref 39.0–52.0)
HEMOGLOBIN: 11.3 g/dL — AB (ref 13.0–17.0)
MCH: 28.4 pg (ref 26.0–34.0)
MCHC: 33 g/dL (ref 30.0–36.0)
MCV: 85.9 fL (ref 78.0–100.0)
PLATELETS: 205 10*3/uL (ref 150–400)
RBC: 3.98 MIL/uL — AB (ref 4.22–5.81)
RDW: 14.8 % (ref 11.5–15.5)
WBC: 13.3 10*3/uL — AB (ref 4.0–10.5)

## 2014-09-05 LAB — COMPREHENSIVE METABOLIC PANEL
ALK PHOS: 104 U/L (ref 39–117)
ALT: 19 U/L (ref 0–53)
AST: 24 U/L (ref 0–37)
Albumin: 2.3 g/dL — ABNORMAL LOW (ref 3.5–5.2)
Anion gap: 14 (ref 5–15)
BILIRUBIN TOTAL: 1.2 mg/dL (ref 0.3–1.2)
BUN: 29 mg/dL — ABNORMAL HIGH (ref 6–23)
CALCIUM: 9 mg/dL (ref 8.4–10.5)
CHLORIDE: 100 meq/L (ref 96–112)
CO2: 26 mEq/L (ref 19–32)
Creatinine, Ser: 1.12 mg/dL (ref 0.50–1.35)
GFR calc non Af Amer: 61 mL/min — ABNORMAL LOW (ref >90)
GFR, EST AFRICAN AMERICAN: 71 mL/min — AB (ref >90)
Glucose, Bld: 136 mg/dL — ABNORMAL HIGH (ref 70–99)
POTASSIUM: 3 meq/L — AB (ref 3.7–5.3)
SODIUM: 140 meq/L (ref 137–147)
Total Protein: 6.5 g/dL (ref 6.0–8.3)

## 2014-09-05 LAB — CLOSTRIDIUM DIFFICILE BY PCR: Toxigenic C. Difficile by PCR: NEGATIVE

## 2014-09-05 MED ORDER — MENTHOL 3 MG MT LOZG
1.0000 | LOZENGE | OROMUCOSAL | Status: DC | PRN
  Administered 2014-09-05: 3 mg via ORAL
  Filled 2014-09-05: qty 9

## 2014-09-05 MED ORDER — METOPROLOL TARTRATE 25 MG PO TABS
25.0000 mg | ORAL_TABLET | Freq: Two times a day (BID) | ORAL | Status: DC
  Administered 2014-09-05 – 2014-09-08 (×6): 25 mg via ORAL
  Filled 2014-09-05 (×8): qty 1

## 2014-09-05 MED ORDER — METOPROLOL TARTRATE 12.5 MG HALF TABLET
ORAL_TABLET | ORAL | Status: AC
  Filled 2014-09-05: qty 1

## 2014-09-05 MED ORDER — POTASSIUM CHLORIDE 10 MEQ/100ML IV SOLN
10.0000 meq | INTRAVENOUS | Status: AC
  Administered 2014-09-05 (×3): 10 meq via INTRAVENOUS

## 2014-09-05 MED ORDER — FENTANYL CITRATE 0.05 MG/ML IJ SOLN
INTRAMUSCULAR | Status: AC
  Filled 2014-09-05: qty 2

## 2014-09-05 MED ORDER — POTASSIUM CHLORIDE 10 MEQ/100ML IV SOLN
INTRAVENOUS | Status: AC
  Filled 2014-09-05: qty 100

## 2014-09-05 MED ORDER — CHLORHEXIDINE GLUCONATE 0.12 % MT SOLN
OROMUCOSAL | Status: AC
  Filled 2014-09-05: qty 15

## 2014-09-05 MED ORDER — CHLORHEXIDINE GLUCONATE 0.12 % MT SOLN
OROMUCOSAL | Status: AC
  Administered 2014-09-05: 15 mL
  Filled 2014-09-05: qty 15

## 2014-09-05 MED ORDER — RIVAROXABAN 20 MG PO TABS
20.0000 mg | ORAL_TABLET | Freq: Every day | ORAL | Status: DC
  Administered 2014-09-05 – 2014-09-07 (×3): 20 mg via ORAL
  Filled 2014-09-05 (×5): qty 1

## 2014-09-05 MED ORDER — CETYLPYRIDINIUM CHLORIDE 0.05 % MT LIQD
7.0000 mL | Freq: Two times a day (BID) | OROMUCOSAL | Status: DC
  Administered 2014-09-05 – 2014-09-07 (×5): 7 mL via OROMUCOSAL

## 2014-09-05 MED ORDER — LATANOPROST 0.005 % OP SOLN
1.0000 [drp] | Freq: Every day | OPHTHALMIC | Status: DC
  Filled 2014-09-05: qty 2.5

## 2014-09-05 MED ORDER — PANTOPRAZOLE SODIUM 40 MG PO TBEC
DELAYED_RELEASE_TABLET | ORAL | Status: AC
  Filled 2014-09-05: qty 1

## 2014-09-05 MED ORDER — BIMATOPROST 0.01 % OP SOLN
1.0000 [drp] | Freq: Every day | OPHTHALMIC | Status: DC
  Administered 2014-09-05 – 2014-09-07 (×3): 1 [drp] via OPHTHALMIC
  Filled 2014-09-05: qty 2.5

## 2014-09-05 MED ORDER — FUROSEMIDE 10 MG/ML IJ SOLN
40.0000 mg | Freq: Once | INTRAMUSCULAR | Status: AC
  Administered 2014-09-05: 40 mg via INTRAVENOUS
  Filled 2014-09-05: qty 4

## 2014-09-05 MED ORDER — POTASSIUM CHLORIDE CRYS ER 20 MEQ PO TBCR
40.0000 meq | EXTENDED_RELEASE_TABLET | Freq: Three times a day (TID) | ORAL | Status: AC
  Administered 2014-09-05 (×2): 40 meq via ORAL
  Filled 2014-09-05 (×2): qty 2

## 2014-09-05 MED ORDER — CHLORHEXIDINE GLUCONATE 0.12 % MT SOLN
15.0000 mL | Freq: Two times a day (BID) | OROMUCOSAL | Status: DC
  Administered 2014-09-05 – 2014-09-08 (×7): 15 mL via OROMUCOSAL
  Filled 2014-09-05 (×8): qty 15

## 2014-09-05 MED ORDER — RIVAROXABAN 20 MG PO TABS: 20.0000 mg | ORAL_TABLET | Freq: Every day | ORAL | Status: DC

## 2014-09-05 MED ORDER — PANTOPRAZOLE SODIUM 40 MG PO TBEC
DELAYED_RELEASE_TABLET | ORAL | Status: AC
  Administered 2014-09-05: 40 mg
  Filled 2014-09-05: qty 1

## 2014-09-05 NOTE — Progress Notes (Signed)
Pt resting, son called for update.

## 2014-09-05 NOTE — Progress Notes (Signed)
Subjective: Pt feeling a bit better  Objective: Physical Exam: BP 147/73 mmHg  Pulse 128  Temp(Src) 98.2 F (36.8 C) (Oral)  Resp 20  Ht 5\' 10"  (1.778 m)  Wt 201 lb 8 oz (91.4 kg)  BMI 28.91 kg/m2  SpO2 98% RUQ drain intact, site clean. Mildly tender Output bilious, about 182mL recorded since placement    Labs: CBC  Recent Labs  09/04/14 0437 09/05/14 0330  WBC 17.3* 13.3*  HGB 11.7* 11.3*  HCT 34.5* 34.2*  PLT 172 205   BMET  Recent Labs  09/04/14 0437 09/05/14 0330  NA 137 140  K 3.8 3.0*  CL 100 100  CO2 22 26  GLUCOSE 153* 136*  BUN 32* 29*  CREATININE 1.29 1.12  CALCIUM 8.7 9.0   LFT  Recent Labs  09/03/14 0751 09/04/14 0437 09/05/14 0330  PROT 7.0 6.4 6.5  ALBUMIN 2.7* 2.4* 2.3*  AST 32 22 24  ALT 26 21 19   ALKPHOS 92 87 104  BILITOT 4.0* 1.8* 1.2  BILIDIR  --  1.2*  --   LIPASE 40  --   --    PT/INR  Recent Labs  09/03/14 1601 09/04/14 0437  LABPROT 30.1* 26.0*  INR 2.85* 2.36*     Studies/Results: Ct Abdomen Pelvis W Contrast  09/04/2014   CLINICAL DATA:  Percutaneous cholecystostomy tube placed earlier today 09/04/2014 draining large amounts of dark bile. Right side abdominal pain and shoulder pain.  EXAM: CT ABDOMEN AND PELVIS WITH CONTRAST  TECHNIQUE: Multidetector CT imaging of the abdomen and pelvis was performed using the standard protocol following bolus administration of intravenous contrast.  CONTRAST:  69mL OMNIPAQUE IOHEXOL 300 MG/ML  SOLN  COMPARISON:  09/02/2014  FINDINGS: There is a new small right pleural effusion with associated compressive atelectasis. There is a trace left pleural effusion with associated atelectasis. There is no pneumothorax.  There is a 6.4 x 4.3 cm hypodense, fluid attenuating left hepatic mass consistent with a cyst. There is no intrahepatic or extrahepatic biliary ductal dilatation.  There is interval percutaneous cholecystostomy with a pigtail catheter coiled in the neck of the gallbladder.  There is a small amount of perihepatic low density fluid which is new compared with the prior examination likely reflecting a small amount of bile and possibly resulting in Mild bile peritonitis. There are pericholecystic inflammatory changes again noted.  The spleen demonstrates no focal abnormality. The kidneys, adrenal glands and pancreas are normal. The bladder is unremarkable.  The stomach, duodenum, small intestine, and large intestine demonstrate no bowel wall thickening or dilatation. There is no pneumoperitoneum, pneumatosis, or portal venous gas. There is no lymphadenopathy.  The abdominal aorta is normal in caliber with atherosclerosis.  There are no lytic or sclerotic osseous lesions. There is lumbar spine spondylosis.  IMPRESSION: 1. Interval percutaneous cholecystostomy tube placement with the tip of the catheter in the gallbladder neck. The gallbladder is decompressed. There are persistent pericholecystic inflammatory changes. There is new small amount of perihepatic fluid which is low density likely reflecting bile or simple fluid secondary to bile peritonitis. 2. New small right pleural effusion and compressive atelectasis. Trace left pleural effusion and mild atelectasis. No pneumothorax.   Electronically Signed   By: Kathreen Devoid   On: 09/04/2014 16:38   Ir Perc Cholecystostomy  09/04/2014   INDICATION: Acute cholecystitis.  The patient has been been prescribed is a relative 0, with the last dose 09/02/2014. The family and the patient understand increased bleeding risk, given that  there has been only 48 hours off the medication. The benefit of doing the procedure on this patient hospitalized in the ICU outweighs the risk of the potential bleed.  EXAM: ULTRASOUND AND FLUOROSCOPIC-GUIDED CHOLECYSTOSTOMY TUBE PLACEMENT  COMPARISON:  None.  MEDICATIONS: Fentanyl 100 mcg IV; Versed 2 mg IV;  250 mg Levaquin IV  Procedural  ANESTHESIA/SEDATION: Total Moderate Sedation Time  Seventeen minutes   CONTRAST:  73mL OMNIPAQUE IOHEXOL 300 MG/ML  SOLN  FLUOROSCOPY TIME:  0 minutes minutes 48 seconds  COMPLICATIONS: None  PROCEDURE: Informed written consent was obtained from the patient after a discussion of the risks, benefits and alternatives to treatment. Questions regarding the procedure were encouraged and answered. A timeout was performed prior to the initiation of the procedure.  The right upper abdominal quadrant was prepped and draped in the usual sterile fashion, and a sterile drape was applied covering the operative field. Maximum barrier sterile technique with sterile gowns and gloves were used for the procedure. A timeout was performed prior to the initiation of the procedure. Local anesthesia was provided with 1% lidocaine with epinephrine.  Ultrasound scanning of the right upper quadrant demonstrates a markedly dilated gallbladder. Of note, the patient reported pain with ultrasound imaging over the gallbladder. Utilizing a transhepatic approach, a 22 gauge needle was advanced into the gallbladder under direct ultrasound guidance. An ultrasound image was saved for documentation purposes. Appropriate intraluminal puncture was confirmed with the efflux of bile and advancement of an 0.018 wire into the gallbladder lumen. The needle was exchanged for an [place] set. A small amount of contrast was injected to confirm appropriate intraluminal positioning. Over a Benson wire, a 62.2-French Cook cholecystomy tube was advanced into the gallbladder fossa, coiled and locked. Bile was aspirated and a small amount of contrast was injected as several post procedural spot radiographic images were obtained in various obliquities. The catheter was secured to the skin with suture, connected to a drainage bag and a dressing was placed. The patient tolerated the procedure well without immediate post procedural complication.  IMPRESSION: Status post percutaneous cholecystostomy.  PLAN: The patient became acutely  combative and agitated during the procedure. Hemodynamically, there was no hypotension, though the patient was tachycardic. No drop in the oxygenation. This response may have a then acute bacteremia/sepsis, or alternatively this may be related to pain and paradoxical reaction to the sedation medication.  The patient was transported directly to his bed in knee ICU, and communication of the patient's change in mental status was communicated to the ICU team in his primary physician.  Consideration may be for bacteremia/sepsis versus paradoxical reaction to sedation.  Chest x-ray and correlation with cardiac markers may be considered.  Signed,  Dulcy Fanny. Earleen Newport, DO  [facility]  [facility]   Electronically Signed   By: Corrie Mckusick D.O.   On: 09/04/2014 11:17   Dg Chest Port 1 View  09/05/2014   CLINICAL DATA:  Acute onset of respiratory distress. Subsequent encounter.  EXAM: PORTABLE CHEST - 1 VIEW  COMPARISON:  Chest radiograph performed 09/04/2014  FINDINGS: The lungs are hypoexpanded. Vascular congestion is noted. Increased interstitial markings raise concern for persistent pulmonary edema, perhaps slightly improved from the prior study. Mild bilateral atelectasis is also seen. No definite pleural effusion or pneumothorax is identified.  The cardiomediastinal silhouette is mildly enlarged. No acute osseous abnormalities are seen.  IMPRESSION: Vascular congestion and mild cardiomegaly. Increased interstitial markings raise concern for persistent pulmonary edema, perhaps slightly improved from  the prior study. Lungs hypoexpanded, with mild bilateral atelectasis.   Electronically Signed   By: Garald Balding M.D.   On: 09/05/2014 05:56   Dg Chest Port 1 View  09/04/2014   CLINICAL DATA:  Acute respiratory distress. Personal history of hypertension. Shortness of breath.  EXAM: PORTABLE CHEST - 1 VIEW  COMPARISON:  09/02/2014  FINDINGS: The heart is enlarged. There is  interstitial and early alveolar edema. The may be a small amount of pleural fluid. No consolidation or lobar collapse.  IMPRESSION: Pulmonary edema, more pronounced than on the previous study.   Electronically Signed   By: Nelson Chimes M.D.   On: 09/04/2014 12:19   Dg Abd Portable 1v  09/04/2014   CLINICAL DATA:  Abdominal pain, not otherwise specified. Previous history stated right-sided abdominal and pelvic pain following colonoscopy. Acute cholecystitis. Percutaneous cholecystostomy.  EXAM: PORTABLE ABDOMEN - 1 VIEW  COMPARISON:  09/04/2014.  09/02/2014.  FINDINGS: Drainage catheter in the right upper quadrant consistent with cholecystostomy. No sign of free air. Bowel gas pattern does not show evidence of obstruction. Chronic degenerative changes affect the spine.  IMPRESSION: Cholecystostomy tube remains in place. No complicating feature by radiography.   Electronically Signed   By: Nelson Chimes M.D.   On: 09/04/2014 15:16   US Abdomen Limited Ruq  09/03/2014   CLINICAL DATA:  Elevated bilirubin level.  EXAM: US ABDOMEN LIMITED - RIGHT UPPER QUADRANT  COMPARISON:  CT scan of September 02, 2014.  FINDINGS: Gallbladder:  Small gallstone is noted in neck of gallbladder, with sludge present within gallbladder lumen. Gallbladder wall is mildly thickened at 4.7 mm without definite pericholecystic fluid. No sonographic Murphy's sign is noted.  Common bile duct:  Diameter: 3.5 mm which is within normal limits.  Liver:  No focal abnormality is noted. Increased echogenicity is noted suggesting fatty infiltration. Small amount of free fluid is seen adjacent to the liver.  IMPRESSION: Solitary gallstone is noted in the neck of the gallbladder with sludge present within gallbladder lumen. Mild gallbladder wall thickening is noted and these findings may represent acute cholecystitis. HIDA scan may be performed for further evaluation.  Fatty infiltration of the liver is noted. Small amount of free fluid is seen adjacent  to the right hepatic lobe.   Electronically Signed   By: Sabino Dick M.D.   On: 09/03/2014 21:25    Assessment/Plan: Acute cholecystitis s/p perc chole drain WBC trending down, clinically better Plans per CCS/TR[facility]    LOS: 3 days    Ascencion Dike PA-C 09/05/2014 12:36 PM

## 2014-09-05 NOTE — Progress Notes (Signed)
ANTIBIOTIC CONSULT NOTE - FOLLOW UP  Pharmacy Consult for Zosyn Indication: intra-abdominal infection  Allergies  Allergen Reactions  . Ace Inhibitors     Lips inflated to about "20 pound"  . Clonidine Derivatives Rash    Patient Measurements: Height: 5\' 10"  (177.8 cm) Weight: 201 lb 8 oz (91.4 kg) IBW/kg (Calculated) : 73  Assessment: 36 YOM s/p colonoscopy 12/1, began having RLQ pain during prep. Sent from PCP office for elevated WBC and bilirubin. Admitted with cholecystitis, cholelithiasis and probable choledocholithiasis. Patient underwent perc drain placement on 12/4 per IR. Patient developed decreased mental status & SOB during procedure and was transferred to ICU/SD post-op. Pharmacy originally consulted to dose vancomycin and Zosyn for sepsis, alter de-escalated to Zosyn only for intra-abdominal infection  Antiinfectives 12/2 >> zosyn >> 12/2 >> vancomycin>>12/2 12/4 >> levofloxacin >> 12/5  Labs / vitals Tmax: 101.3 WBCs: elevated, improved Renal: SCr 1.12, CrCl 62 ml/min CG, CrCl 55N Lactate: 1.85  Microbiology 12/2 blood x 2: ngtd 12/2 UA appears positive for UTI 12/3 MRSA PCR: negative 12/4 abscess: ngtd 12/4 blood x 2: ngtd 12/4 C.diff PCR: IP  Goal of Therapy:  Zosyn per indication and renal function  Plan:  - continue Zosyn 3.375G IV q8h, each dose to be infused over 4 hours - follow-up clinical course, culture results, renal function - follow-up antibiotic de-escalation and length of therapy  Thank you for the consult.  Currie Paris, PharmD, BCPS Pager: [phone] Pharmacy: [phone] 09/05/2014 12:55 PM

## 2014-09-05 NOTE — Progress Notes (Signed)
Pt has required multiple doses of pain medications during the night.  Pt has 6 L [place], pt 02 sats remain stable.  Pt gets very anxious and agitated, wants to drink water.  Pt orders are NPO with sips with meds.  Pt informed.

## 2014-09-05 NOTE — Progress Notes (Signed)
General Surgery Note  LOS: 3 days  POD -     Assessment/Plan: 1.  Acute cholecystitis - Perc drain - 09/04/2014 - Dr. Rolla Plate  Levaquin/Zosyn  WBC better  Clinically better.  To start clear liquids and see how he does.  2.  A. Fib 3.  Chronic anticoagulation  INR - 2.36 on 09/04/2014 4.  HTN 5.  OSA 6.  Chronic CHF 7.  DVT prophylaxis - on hold for anticoagulation 8.  On isolation for diarrhea  To check C diff  [Note - I think that Dr. Marlou Starks charted on the wrong patient when he wrote about a bowel obstruction on a note he wrote on 09/04/2014.  There are no plans at this time for surgery.]   Principal Problem:   Acute cholecystitis Active Problems:   Chronic atrial fibrillation   Hypertension   Chronic anticoagulation   Chronic diastolic heart failure   OSA (obstructive sleep apnea)   Elevated lipase   Elevated brain natriuretic peptide (BNP) level   Elevated bilirubin   Hyperglycemia   Acute confusional state   Cholelithiasis   Choledocholithiasis   IFG (impaired fasting glucose)   Acute respiratory failure   Acute on chronic diastolic CHF (congestive heart failure)   Hypokalemia   Subjective:  Very thirsty.  Looks okay.  Son Marya Amsler:  [id number].  I discussed findings with the family. Objective:   Filed Vitals:   09/05/14 0818  BP: 158/55  Pulse: 103  Temp:   Resp:      Intake/Output from previous day:  12/04 0701 - 12/05 0700 In: 215 [I.V.:10; IV Piggyback:200] Out: 2840 [Urine:2700; Drains:140]  Intake/Output this shift:      Physical Exam:   General: WN older WM who is alert.  He just wants something to drink.   HEENT: Normal. Pupils equal. .   Lungs: Clear.  Will get him IS.   Abdomen: Soft.  Well healed midline scar from prior colon resection. Perc drain in RUQ. 140 cc recorded last 24 hours.   Lab Results:    Recent Labs  09/04/14 0437 09/05/14 0330  WBC 17.3* 13.3*  HGB 11.7* 11.3*  HCT 34.5* 34.2*  PLT 172 205    BMET   Recent  Labs  09/04/14 0437 09/05/14 0330  NA 137 140  K 3.8 3.0*  CL 100 100  CO2 22 26  GLUCOSE 153* 136*  BUN 32* 29*  CREATININE 1.29 1.12  CALCIUM 8.7 9.0    PT/INR   Recent Labs  09/03/14 1601 09/04/14 0437  LABPROT 30.1* 26.0*  INR 2.85* 2.36*    ABG   Recent Labs  09/02/14 1706  HCO3 24.4*     Studies/Results:  Ct Abdomen Pelvis W Contrast  09/04/2014   CLINICAL DATA:  Percutaneous cholecystostomy tube placed earlier today 09/04/2014 draining large amounts of dark bile. Right side abdominal pain and shoulder pain.  EXAM: CT ABDOMEN AND PELVIS WITH CONTRAST  TECHNIQUE: Multidetector CT imaging of the abdomen and pelvis was performed using the standard protocol following bolus administration of intravenous contrast.  CONTRAST:  58mL OMNIPAQUE IOHEXOL 300 MG/ML  SOLN  COMPARISON:  09/02/2014  FINDINGS: There is a new small right pleural effusion with associated compressive atelectasis. There is a trace left pleural effusion with associated atelectasis. There is no pneumothorax.  There is a 6.4 x 4.3 cm hypodense, fluid attenuating left hepatic mass consistent with a cyst. There is no intrahepatic or extrahepatic biliary ductal dilatation.  There is interval  percutaneous cholecystostomy with a pigtail catheter coiled in the neck of the gallbladder. There is a small amount of perihepatic low density fluid which is new compared with the prior examination likely reflecting a small amount of bile and possibly resulting in Mild bile peritonitis. There are pericholecystic inflammatory changes again noted.  The spleen demonstrates no focal abnormality. The kidneys, adrenal glands and pancreas are normal. The bladder is unremarkable.  The stomach, duodenum, small intestine, and large intestine demonstrate no bowel wall thickening or dilatation. There is no pneumoperitoneum, pneumatosis, or portal venous gas. There is no lymphadenopathy.  The abdominal aorta is normal in caliber with  atherosclerosis.  There are no lytic or sclerotic osseous lesions. There is lumbar spine spondylosis.  IMPRESSION: 1. Interval percutaneous cholecystostomy tube placement with the tip of the catheter in the gallbladder neck. The gallbladder is decompressed. There are persistent pericholecystic inflammatory changes. There is new small amount of perihepatic fluid which is low density likely reflecting bile or simple fluid secondary to bile peritonitis. 2. New small right pleural effusion and compressive atelectasis. Trace left pleural effusion and mild atelectasis. No pneumothorax.   Electronically Signed   By: Kathreen Devoid   On: 09/04/2014 16:38   Ir Perc Cholecystostomy  09/04/2014   INDICATION: Acute cholecystitis.  The patient has been been prescribed is a relative 0, with the last dose 09/02/2014. The family and the patient understand increased bleeding risk, given that there has been only 48 hours off the medication. The benefit of doing the procedure on this patient hospitalized in the ICU outweighs the risk of the potential bleed.  EXAM: ULTRASOUND AND FLUOROSCOPIC-GUIDED CHOLECYSTOSTOMY TUBE PLACEMENT  COMPARISON:  None.  MEDICATIONS: Fentanyl 100 mcg IV; Versed 2 mg IV;  250 mg Levaquin IV  Procedural  ANESTHESIA/SEDATION: Total Moderate Sedation Time  Seventeen minutes  CONTRAST:  15mL OMNIPAQUE IOHEXOL 300 MG/ML  SOLN  FLUOROSCOPY TIME:  0 minutes minutes 48 seconds  COMPLICATIONS: None  PROCEDURE: Informed written consent was obtained from the patient after a discussion of the risks, benefits and alternatives to treatment. Questions regarding the procedure were encouraged and answered. A timeout was performed prior to the initiation of the procedure.  The right upper abdominal quadrant was prepped and draped in the usual sterile fashion, and a sterile drape was applied covering the operative field. Maximum barrier sterile technique with sterile gowns and gloves were used for the procedure. A timeout  was performed prior to the initiation of the procedure. Local anesthesia was provided with 1% lidocaine with epinephrine.  Ultrasound scanning of the right upper quadrant demonstrates a markedly dilated gallbladder. Of note, the patient reported pain with ultrasound imaging over the gallbladder. Utilizing a transhepatic approach, a 22 gauge needle was advanced into the gallbladder under direct ultrasound guidance. An ultrasound image was saved for documentation purposes. Appropriate intraluminal puncture was confirmed with the efflux of bile and advancement of an 0.018 wire into the gallbladder lumen. The needle was exchanged for an Tradewinds set. A small amount of contrast was injected to confirm appropriate intraluminal positioning. Over a Benson wire, a 43.2-French Cook cholecystomy tube was advanced into the gallbladder fossa, coiled and locked. Bile was aspirated and a small amount of contrast was injected as several post procedural spot radiographic images were obtained in various obliquities. The catheter was secured to the skin with suture, connected to a drainage bag and a dressing was placed. The patient tolerated the procedure well without immediate post procedural complication.  IMPRESSION: Status  post percutaneous cholecystostomy.  PLAN: The patient became acutely combative and agitated during the procedure. Hemodynamically, there was no hypotension, though the patient was tachycardic. No drop in the oxygenation. This response may have a then acute bacteremia/sepsis, or alternatively this may be related to pain and paradoxical reaction to the sedation medication.  The patient was transported directly to his bed in knee ICU, and communication of the patient's change in mental status was communicated to the ICU team in his primary physician.  Consideration may be for bacteremia/sepsis versus paradoxical reaction to sedation.  Chest x-ray and correlation with cardiac markers may be considered.  Signed,   Dulcy Fanny. Earleen Newport, DO  [facility]  [facility]   Electronically Signed   By: Corrie Mckusick D.O.   On: 09/04/2014 11:17   Dg Chest Port 1 View  09/05/2014   CLINICAL DATA:  Acute onset of respiratory distress. Subsequent encounter.  EXAM: PORTABLE CHEST - 1 VIEW  COMPARISON:  Chest radiograph performed 09/04/2014  FINDINGS: The lungs are hypoexpanded. Vascular congestion is noted. Increased interstitial markings raise concern for persistent pulmonary edema, perhaps slightly improved from the prior study. Mild bilateral atelectasis is also seen. No definite pleural effusion or pneumothorax is identified.  The cardiomediastinal silhouette is mildly enlarged. No acute osseous abnormalities are seen.  IMPRESSION: Vascular congestion and mild cardiomegaly. Increased interstitial markings raise concern for persistent pulmonary edema, perhaps slightly improved from the prior study. Lungs hypoexpanded, with mild bilateral atelectasis.   Electronically Signed   By: Garald Balding M.D.   On: 09/05/2014 05:56   Dg Chest Port 1 View  09/04/2014   CLINICAL DATA:  Acute respiratory distress. Personal history of hypertension. Shortness of breath.  EXAM: PORTABLE CHEST - 1 VIEW  COMPARISON:  09/02/2014  FINDINGS: The heart is enlarged. There is interstitial and early alveolar edema. The may be a small amount of pleural fluid. No consolidation or lobar collapse.  IMPRESSION: Pulmonary edema, more pronounced than on the previous study.   Electronically Signed   By: Nelson Chimes M.D.   On: 09/04/2014 12:19   Dg Abd Portable 1v  09/04/2014   CLINICAL DATA:  Abdominal pain, not otherwise specified. Previous history stated right-sided abdominal and pelvic pain following colonoscopy. Acute cholecystitis. Percutaneous cholecystostomy.  EXAM: PORTABLE ABDOMEN - 1 VIEW  COMPARISON:  09/04/2014.  09/02/2014.  FINDINGS: Drainage catheter in the right upper quadrant consistent with  cholecystostomy. No sign of free air. Bowel gas pattern does not show evidence of obstruction. Chronic degenerative changes affect the spine.  IMPRESSION: Cholecystostomy tube remains in place. No complicating feature by radiography.   Electronically Signed   By: Nelson Chimes M.D.   On: 09/04/2014 15:16   US Abdomen Limited Ruq  09/03/2014   CLINICAL DATA:  Elevated bilirubin level.  EXAM: US ABDOMEN LIMITED - RIGHT UPPER QUADRANT  COMPARISON:  CT scan of September 02, 2014.  FINDINGS: Gallbladder:  Small gallstone is noted in neck of gallbladder, with sludge present within gallbladder lumen. Gallbladder wall is mildly thickened at 4.7 mm without definite pericholecystic fluid. No sonographic Murphy's sign is noted.  Common bile duct:  Diameter: 3.5 mm which is within normal limits.  Liver:  No focal abnormality is noted. Increased echogenicity is noted suggesting fatty infiltration. Small amount of free fluid is seen adjacent to the liver.  IMPRESSION: Solitary gallstone is noted in the neck of the gallbladder with sludge present within gallbladder lumen. Mild gallbladder wall thickening is noted  and these findings may represent acute cholecystitis. HIDA scan may be performed for further evaluation.  Fatty infiltration of the liver is noted. Small amount of free fluid is seen adjacent to the right hepatic lobe.   Electronically Signed   By: Sabino Dick M.D.   On: 09/03/2014 21:25     Anti-infectives:   Anti-infectives    Start     Dose/Rate Route Frequency Ordered Stop   09/04/14 2000  piperacillin-tazobactam (ZOSYN) IVPB 3.375 g     3.375 g12.5 mL/hr over 240 Minutes Intravenous Every 8 hours 09/04/14 1316     09/04/14 0845  Levofloxacin (LEVAQUIN) IVPB 250 mg     250 mg50 mL/hr over 60 Minutes Intravenous Every 24 hours 09/04/14 0845     09/03/14 0200  piperacillin-tazobactam (ZOSYN) IVPB 3.375 g  Status:  Discontinued     3.375 g12.5 mL/hr over 240 Minutes Intravenous Every 8 hours 09/02/14 2327  09/04/14 1316   09/02/14 1700  vancomycin (VANCOCIN) 2,000 mg in sodium chloride 0.9 % 500 mL IVPB     2,000 mg250 mL/hr over 120 Minutes Intravenous  Once 09/02/14 1658 09/02/14 2002   09/02/14 1700  piperacillin-tazobactam (ZOSYN) IVPB 3.375 g     3.375 g100 mL/hr over 30 Minutes Intravenous  Once 09/02/14 1658 09/02/14 1803      Alphonsa Overall, MD, FACS Pager: [facility] Office: [phone] 09/05/2014

## 2014-09-05 NOTE — Progress Notes (Signed)
Progress Note   [name]:[id number] DOB: 04-02-1934 DOA: 09/02/2014 PCP: Simona Huh, MD   Brief Narrative:   Cameron [name] is an 80 y.o. male with a PMH of hypertension, OSA, neuropathy, atrial fibrillation on chronic Xarelto and chronic diastolic heart failure who was admitted 09/02/14 with a chief complaint of RUQ pain and reports of confusion. He saw his PCP and was found to have hyperbilirubinemia. A CT scan of the abdomen, done on admission, showed acute cholecystitis. The patient underwent a cholecystostomy on 09/04/14, and became agitated with increased confusion during the procedure and post procedure had pain out of proportion to procedure performed.  He also required Bipap post procedure. A CTU scan was done which showed appropriate placement of his cholecystostomy tube with no evidence of complications.  Assessment/Plan:   Principal Problem:   Acute cholecystitis with hyperlipasemia and hyperbilirubinemia/cholelithiasis/choledocholithiasis  CT findings as noted below. Stone noted in the cystic duct.  Continue empiric IV Zosyn. Follow-up blood cultures done 09/02/14 and 09/04/14.  S/P surgical consultation /GI consultations.  Dr. Cristina Gong did not think he had choledocholithiasis and did not recommend ERCP.  S/P U/S which showed a stone in the gallbladder neck, normal bile duct caliber.    Underwent cholecystotomy 09/04/14. Postprocedure CT scan as noted below.  Active Problems:   Diarrhea  Ruling out C. Diff.    Abdominal pain  Fentanyl ordered as needed.    Hyperglycemia/impaired fasting glucose  Hemoglobin A1c 6%, corresponding to a mean plasma glucose of 126. The patient is at risk for developing type 2 diabetes.    Acute confusional state  Continue neuro checks.  CT of the head negative on admission.  Delirium is likely related to acute illness.    Chronic atrial fibrillation / chronic anticoagulation  Xarelto on hold. Okay to  resume.  Heart rate 99-109.  Continue oral metoprolol and IV metoprolol as needed for heart rate greater than 100    Hypertension  Continue Cardura and metoprolol.    Acute on chronic diastolic heart failure / elevated BNP  With decompensation in respiratory status post procedure 09/04/14 and chest x-ray showing worsening pulmonary edema, the patient was given a dose of IV Lasix. I/O balance negative 2.6 L this a.m.  Monitor fluid volume status closely.    Acute respiratory failure/OSA (obstructive sleep apnea)  Continue CPAP as needed.    Hypokalemia  We'll give 6 runs of IV potassium.    DVT Prophylaxis  Resume Xarelto.  Code Status: Full. Family Communication: Son, Nigel Ericsson updated at the bedside 09/04/14, and daughter-in-law updated by telephone today. Disposition Plan: Home when stable.   IV Access:    Peripheral IV   Procedures and diagnostic studies:   Dg Chest 2 View 09/02/2014: 1. Mild stable cardiac enlargement. 2. Low lung volumes with vascular crowding and streaky bibasilar atelectasis. 3. No pneumothorax or free intra-abdominal air.     Dg Ribs Unilateral Right 09/02/2014: No RIGHT rib abnormalities are radiographically identified.     Ct Head Wo Contrast 09/02/2014: No acute intracranial process ; normal noncontrast CT of the head for age.     Ct Abdomen Pelvis W Contrast 09/02/2014: Acute cholecystitis without biliary dilatation. Equivocal stone in the cystic duct identified.  Small amount of free pelvic fluid.  Cardiomegaly and mild bibasilar atelectasis.     Dg Abd Acute W/chest 09/02/2014: 1. Stable cardiac enlargement. 2. Low lung volumes with vascular crowding, vascular congestion and bibasilar atelectasis. 3. Possible early or  partial small bowel obstruction.     US Abdomen Limited Ruq 09/03/2014: Solitary gallstone is noted in the neck of the gallbladder with sludge present within gallbladder lumen. Mild gallbladder wall thickening is noted and these  findings may represent acute cholecystitis. HIDA scan may be performed for further evaluation.  Fatty infiltration of the liver is noted. Small amount of free fluid is seen adjacent to the right hepatic lobe.     Ct Abdomen Pelvis W Contrast 09/04/2014: 1. Interval percutaneous cholecystostomy tube placement with the tip of the catheter in the gallbladder neck. The gallbladder is decompressed. There are persistent pericholecystic inflammatory changes. There is new small amount of perihepatic fluid which is low density likely reflecting bile or simple fluid secondary to bile peritonitis. 2. New small right pleural effusion and compressive atelectasis. Trace left pleural effusion and mild atelectasis. No pneumothorax.     Ir Perc Cholecystostomy 09/04/2014: Status post percutaneous cholecystostomy.  PLAN: The patient became acutely combative and agitated during the procedure. Hemodynamically, there was no hypotension, though the patient was tachycardic. No drop in the oxygenation. This response may have a then acute bacteremia/sepsis, or alternatively this may be related to pain and paradoxical reaction to the sedation medication.  The patient was transported directly to his bed in knee ICU, and communication of the patient's change in mental status was communicated to the ICU team in his primary physician.  Consideration may be for bacteremia/sepsis versus paradoxical reaction to sedation.  Chest x-ray and correlation with cardiac markers may be considered.    Dg Chest Port 1 View 09/04/2014: Pulmonary edema, more pronounced than on the previous study.     Dg Abd Portable 1v 09/04/2014: Cholecystostomy tube remains in place. No complicating feature by radiography.   Electronically Signed   By: Nelson Chimes M.D.   On: 09/04/2014 15:16   Dg Chest Port 1 View 09/05/2014: Vascular congestion and mild cardiomegaly. Increased interstitial markings raise concern for persistent pulmonary edema, perhaps slightly improved  from the prior study. Lungs hypoexpanded, with mild bilateral atelectasis.      Medical Consultants:    Dr. Donnie Mesa, Surgery  Dr. Ronald Lobo, GI  Dr. Baltazar Apo, PCCM  Anti-Infectives:    Vancomycin 09/02/14---> 09/02/14  Zosyn 09/02/14--->   Subjective:   Mahala Menghini became acutely confused, agitated, and restless during placement of his cholecystostomy drain yesterday. He had worsening respiratory status, and significant abdominal pain out of proportion to the procedure performed. He has been anxious.  He is calm, oriented and appropriate this a.m.  RN notices he coughs after oral sips of liquids.  Abdominal pain much better.  Sore at drain site.  Objective:    Filed Vitals:   09/05/14 0200 09/05/14 0400 09/05/14 0600 09/05/14 0700  BP: 111/56 126/67 131/73 150/70  Pulse: 100 100 99 109  Temp:  99 F (37.2 C)    TempSrc:  Rectal    Resp: 20 22 27 21   Height:      Weight:  91.4 kg (201 lb 8 oz)    SpO2: 100% 100% 99% 97%    Intake/Output Summary (Last 24 hours) at 09/05/14 0708 Last data filed at 09/05/14 0459  Gross per 24 hour  Intake    215 ml  Output   2840 ml  Net  -2625 ml    Exam: Gen:  NAD, weak Cardiovascular:  HSIR, No M/R/G Respiratory:  Lungs CTAB Gastrointestinal:  Abdomen soft, RUQ pain, + BS, percutaneous drain in place RUQ  Extremities:  1+ edema   Data Reviewed:    Labs: Basic Metabolic Panel:  Recent Labs Lab 09/02/14 1706 09/03/14 0751 09/04/14 0437 09/05/14 0330  NA 132* 135* 137 140  K 3.7 4.0 3.8 3.0*  CL 92* 96 100 100  CO2 20 22 22 26   GLUCOSE 193* 135* 153* 136*  BUN 14 18 32* 29*  CREATININE 1.07 1.17 1.29 1.12  CALCIUM 8.9 8.9 8.7 9.0   GFR Estimated Creatinine Clearance: 61.8 mL/min (by C-G formula based on Cr of 1.12). Liver Function Tests:  Recent Labs Lab 09/02/14 1706 09/03/14 0751 09/04/14 0437 09/05/14 0330  AST 39* 32 22 24  ALT 26 26 21 19   ALKPHOS 94 92 87 104  BILITOT 4.0* 4.0*  1.8* 1.2  PROT 7.2 7.0 6.4 6.5  ALBUMIN 2.9* 2.7* 2.4* 2.3*    Recent Labs Lab 09/02/14 1706 09/03/14 0751  LIPASE 145* 40   CBC:  Recent Labs Lab 09/02/14 1706 09/03/14 0751 09/04/14 0437 09/05/14 0330  WBC 19.2* 21.0* 17.3* 13.3*  NEUTROABS 16.7*  --   --   --   HGB 12.6* 12.6* 11.7* 11.3*  HCT 37.5* 36.7* 34.5* 34.2*  MCV 85.0 85.0 85.6 85.9  PLT 149* 147* 172 205   BNP (last 3 results)  Recent Labs  08/19/14 0851 09/02/14 1706  PROBNP 26.0 800.4*   Sepsis Labs:  Recent Labs Lab 09/02/14 1706 09/02/14 1717 09/03/14 0751 09/04/14 0437 09/05/14 0330  WBC 19.2*  --  21.0* 17.3* 13.3*  LATICACIDVEN  --  1.85  --   --   --    Microbiology Recent Results (from the past 240 hour(s))  Culture, blood (routine x 2)     Status: None (Preliminary result)   Collection Time: 09/02/14  5:07 PM  Result Value Ref Range Status   Specimen Description BLOOD BLOOD LEFT FOREARM  Final   Special Requests BOTTLES DRAWN AEROBIC AND ANAEROBIC 5 CC  Final   Culture  Setup Time   Final    09/02/2014 23:04 Performed at [facility]    Culture   Final           BLOOD CULTURE RECEIVED NO GROWTH TO DATE CULTURE WILL BE HELD FOR 5 DAYS BEFORE ISSUING A FINAL NEGATIVE REPORT Performed at [facility]    Report Status PENDING  Incomplete  Culture, blood (routine x 2)     Status: None (Preliminary result)   Collection Time: 09/02/14  5:07 PM  Result Value Ref Range Status   Specimen Description BLOOD BLOOD RIGHT FOREARM  Final   Special Requests BOTTLES DRAWN AEROBIC AND ANAEROBIC 5 CC  Final   Culture  Setup Time   Final    09/02/2014 23:04 Performed at [facility]    Culture   Final           BLOOD CULTURE RECEIVED NO GROWTH TO DATE CULTURE WILL BE HELD FOR 5 DAYS BEFORE ISSUING A FINAL NEGATIVE REPORT Performed at [facility]    Report Status PENDING  Incomplete  MRSA PCR Screening     Status: Abnormal   Collection Time: 09/03/14 12:27  AM  Result Value Ref Range Status   MRSA by PCR INVALID RESULTS, SPECIMEN SENT FOR CULTURE (A) NEGATIVE Final    Comment: SPOKE WITH CROFPS,S RN [phone] [id number] [place],N        The GeneXpert MRSA Assay (FDA approved for NASAL specimens only), is one component of a comprehensive MRSA colonization surveillance program.  It is not intended to diagnose MRSA infection nor to guide or monitor treatment for MRSA infections.   MRSA culture     Status: None (Preliminary result)   Collection Time: 09/03/14 12:27 AM  Result Value Ref Range Status   Specimen Description NASOPHARYNGEAL  Final   Special Requests NONE  Final   Culture   Final    NO SUSPICIOUS COLONIES, CONTINUING TO HOLD Performed at [facility]    Report Status PENDING  Incomplete     Medications:   . doxazosin  4 mg Oral Daily  . levofloxacin (LEVAQUIN) IV  250 mg Intravenous Q24H  . metoprolol tartrate  12.5 mg Oral BID  . piperacillin-tazobactam (ZOSYN)  IV  3.375 g Intravenous Q8H  . sodium chloride  3 mL Intravenous Q12H   Continuous Infusions:    Time spent: 35 minutes with > 50% of time discussing current diagnostic test results, clinical impression and plan of care with the patient, his son, coordination of care with multiple specialists.    LOS: 3 days   [facility] Pager [phone]. If unable to reach me by pager, please call my cell phone at [phone].  *Please refer to [email or web address], password [email or web address] to get updated schedule on who will round on this patient, as hospitalists switch teams weekly. If 7PM-7AM, please contact night-coverage at [email or web address], password [email or web address] for any overnight needs.  09/05/2014, 7:08 AM

## 2014-09-05 NOTE — Consult Note (Signed)
PULMONARY / CRITICAL CARE MEDICINE   Name: Cameron [name] MRN: [id number] DOB: 01/20/1934    ADMISSION DATE:  09/02/2014 CONSULTATION DATE:  09/04/14  REFERRING MD :  Dr. Rockne Menghini   CHIEF COMPLAINT:  SOB  INITIAL PRESENTATION:  80 y/o M admitted 12/2 with abdominal pain and confusion. Work up positive for acute cholecystitis and patient underwent perc drain placement on 12/4 per IR.  Patient developed decreased mental status & SOB during procedure and PCCM consulted for evaluation.    STUDIES:  12/02  CT HEAD >> no acute intracranial process 12/02  CT ABD/Pelvis >> acute cholecystitis without biliary dilation, equivocal stone in cystic duct, cardiomegaly & mild bibasilar atx  SIGNIFICANT EVENTS: 12/02  Admit with acute cholecystitis 12/04  IR perc cholecystitis drain placement.  Decompensated during procedure becoming SOB with decreased mental status  SUBJECTIVE: Off BiPAP since 8 PM last night, feels better off BiPAP, [place].  VITAL SIGNS: Temp:  [98.2 F (36.8 C)-101.2 F (38.4 C)] 98.2 F (36.8 C) (12/05 1119) Pulse Rate:  [32-133] 128 (12/05 1000) Resp:  [19-37] 20 (12/05 1000) BP: (99-177)/(55-108) 147/73 mmHg (12/05 1000) SpO2:  [92 %-100 %] 98 % (12/05 1000) FiO2 (%):  [40 %] 40 % (12/04 1945) Weight:  [91.4 kg (201 lb 8 oz)] 91.4 kg (201 lb 8 oz) (12/05 0400)   HEMODYNAMICS:   VENTILATOR SETTINGS: Vent Mode:  [-] Other (Comment) FiO2 (%):  [40 %] 40 % Set Rate:  [10 bmp] 10 bmp PEEP:  [6 cmH20] 6 cmH20 INTAKE / OUTPUT:  Intake/Output Summary (Last 24 hours) at 09/05/14 1154 Last data filed at 09/05/14 1032  Gross per 24 hour  Intake    595 ml  Output   2590 ml  Net  -1995 ml    PHYSICAL EXAMINATION: General:  wdwn elderly male in NAD Neuro:  Awake, alert, interacts appropriately  HEENT:  Mm pink/moist, bipap mask in place Cardiovascular:  s1s2 irr irr, rate 80's-110's, no m/r/g Lungs:  resp's even/non-labored on BiPAP, lungs bilaterally diminished but  clear Abdomen:  Mildly tender on R side, drain in place with brown drainage Musculoskeletal:  No acute deformities  Skin:  Warm/dry, no edema, no rashes or lesions  LABS:  CBC  Recent Labs Lab 09/03/14 0751 09/04/14 0437 09/05/14 0330  WBC 21.0* 17.3* 13.3*  HGB 12.6* 11.7* 11.3*  HCT 36.7* 34.5* 34.2*  PLT 147* 172 205   Coag's  Recent Labs Lab 09/03/14 1601 09/03/14 1615 09/04/14 0437  APTT  --  48*  --   INR 2.85*  --  2.36*   BMET  Recent Labs Lab 09/03/14 0751 09/04/14 0437 09/05/14 0330  NA 135* 137 140  K 4.0 3.8 3.0*  CL 96 100 100  CO2 22 22 26   BUN 18 32* 29*  CREATININE 1.17 1.29 1.12  GLUCOSE 135* 153* 136*   Electrolytes  Recent Labs Lab 09/03/14 0751 09/04/14 0437 09/05/14 0330  CALCIUM 8.9 8.7 9.0   Sepsis Markers  Recent Labs Lab 09/02/14 1717  LATICACIDVEN 1.85   Liver Enzymes  Recent Labs Lab 09/03/14 0751 09/04/14 0437 09/05/14 0330  AST 32 22 24  ALT 26 21 19   ALKPHOS 92 87 104  BILITOT 4.0* 1.8* 1.2  ALBUMIN 2.7* 2.4* 2.3*   Cardiac Enzymes  Recent Labs Lab 09/02/14 1706 09/04/14 1027  TROPONINI  --  <0.30  PROBNP 800.4*  --    Glucose No results for input(s): GLUCAP in the last 168 hours.  Imaging Ct  Abdomen Pelvis W Contrast  09/04/2014   CLINICAL DATA:  Percutaneous cholecystostomy tube placed earlier today 09/04/2014 draining large amounts of dark bile. Right side abdominal pain and shoulder pain.  EXAM: CT ABDOMEN AND PELVIS WITH CONTRAST  TECHNIQUE: Multidetector CT imaging of the abdomen and pelvis was performed using the standard protocol following bolus administration of intravenous contrast.  CONTRAST:  57mL OMNIPAQUE IOHEXOL 300 MG/ML  SOLN  COMPARISON:  09/02/2014  FINDINGS: There is a new small right pleural effusion with associated compressive atelectasis. There is a trace left pleural effusion with associated atelectasis. There is no pneumothorax.  There is a 6.4 x 4.3 cm hypodense, fluid  attenuating left hepatic mass consistent with a cyst. There is no intrahepatic or extrahepatic biliary ductal dilatation.  There is interval percutaneous cholecystostomy with a pigtail catheter coiled in the neck of the gallbladder. There is a small amount of perihepatic low density fluid which is new compared with the prior examination likely reflecting a small amount of bile and possibly resulting in Mild bile peritonitis. There are pericholecystic inflammatory changes again noted.  The spleen demonstrates no focal abnormality. The kidneys, adrenal glands and pancreas are normal. The bladder is unremarkable.  The stomach, duodenum, small intestine, and large intestine demonstrate no bowel wall thickening or dilatation. There is no pneumoperitoneum, pneumatosis, or portal venous gas. There is no lymphadenopathy.  The abdominal aorta is normal in caliber with atherosclerosis.  There are no lytic or sclerotic osseous lesions. There is lumbar spine spondylosis.  IMPRESSION: 1. Interval percutaneous cholecystostomy tube placement with the tip of the catheter in the gallbladder neck. The gallbladder is decompressed. There are persistent pericholecystic inflammatory changes. There is new small amount of perihepatic fluid which is low density likely reflecting bile or simple fluid secondary to bile peritonitis. 2. New small right pleural effusion and compressive atelectasis. Trace left pleural effusion and mild atelectasis. No pneumothorax.   Electronically Signed   By: Kathreen Devoid   On: 09/04/2014 16:38   Ir Perc Cholecystostomy  09/04/2014   INDICATION: Acute cholecystitis.  The patient has been been prescribed is a relative 0, with the last dose 09/02/2014. The family and the patient understand increased bleeding risk, given that there has been only 48 hours off the medication. The benefit of doing the procedure on this patient hospitalized in the ICU outweighs the risk of the potential bleed.  EXAM: ULTRASOUND AND  FLUOROSCOPIC-GUIDED CHOLECYSTOSTOMY TUBE PLACEMENT  COMPARISON:  None.  MEDICATIONS: Fentanyl 100 mcg IV; Versed 2 mg IV;  250 mg Levaquin IV  Procedural  ANESTHESIA/SEDATION: Total Moderate Sedation Time  Seventeen minutes  CONTRAST:  55mL OMNIPAQUE IOHEXOL 300 MG/ML  SOLN  FLUOROSCOPY TIME:  0 minutes minutes 48 seconds  COMPLICATIONS: None  PROCEDURE: Informed written consent was obtained from the patient after a discussion of the risks, benefits and alternatives to treatment. Questions regarding the procedure were encouraged and answered. A timeout was performed prior to the initiation of the procedure.  The right upper abdominal quadrant was prepped and draped in the usual sterile fashion, and a sterile drape was applied covering the operative field. Maximum barrier sterile technique with sterile gowns and gloves were used for the procedure. A timeout was performed prior to the initiation of the procedure. Local anesthesia was provided with 1% lidocaine with epinephrine.  Ultrasound scanning of the right upper quadrant demonstrates a markedly dilated gallbladder. Of note, the patient reported pain with ultrasound imaging over the gallbladder. Utilizing a transhepatic approach, a  22 gauge needle was advanced into the gallbladder under direct ultrasound guidance. An ultrasound image was saved for documentation purposes. Appropriate intraluminal puncture was confirmed with the efflux of bile and advancement of an 0.018 wire into the gallbladder lumen. The needle was exchanged for an Hood set. A small amount of contrast was injected to confirm appropriate intraluminal positioning. Over a Benson wire, a 14.2-French Cook cholecystomy tube was advanced into the gallbladder fossa, coiled and locked. Bile was aspirated and a small amount of contrast was injected as several post procedural spot radiographic images were obtained in various obliquities. The catheter was secured to the skin with suture, connected to a  drainage bag and a dressing was placed. The patient tolerated the procedure well without immediate post procedural complication.  IMPRESSION: Status post percutaneous cholecystostomy.  PLAN: The patient became acutely combative and agitated during the procedure. Hemodynamically, there was no hypotension, though the patient was tachycardic. No drop in the oxygenation. This response may have a then acute bacteremia/sepsis, or alternatively this may be related to pain and paradoxical reaction to the sedation medication.  The patient was transported directly to his bed in knee ICU, and communication of the patient's change in mental status was communicated to the ICU team in his primary physician.  Consideration may be for bacteremia/sepsis versus paradoxical reaction to sedation.  Chest x-ray and correlation with cardiac markers may be considered.  Signed,  Dulcy Fanny. Earleen [place], DO  [facility]  [facility]   Electronically Signed   By: Corrie Mckusick D.O.   On: 09/04/2014 11:17   Dg Chest Port 1 View  09/04/2014   CLINICAL DATA:  Acute respiratory distress. Personal history of hypertension. Shortness of breath.  EXAM: PORTABLE CHEST - 1 VIEW  COMPARISON:  09/02/2014  FINDINGS: The heart is enlarged. There is interstitial and early alveolar edema. The may be a small amount of pleural fluid. No consolidation or lobar collapse.  IMPRESSION: Pulmonary edema, more pronounced than on the previous study.   Electronically Signed   By: Nelson Chimes M.D.   On: 09/04/2014 12:19   Dg Abd Portable 1v  09/04/2014   CLINICAL DATA:  Abdominal pain, not otherwise specified. Previous history stated right-sided abdominal and pelvic pain following colonoscopy. Acute cholecystitis. Percutaneous cholecystostomy.  EXAM: PORTABLE ABDOMEN - 1 VIEW  COMPARISON:  09/04/2014.  09/02/2014.  FINDINGS: Drainage catheter in the right upper quadrant consistent with cholecystostomy. No sign of free air.  Bowel gas pattern does not show evidence of obstruction. Chronic degenerative changes affect the spine.  IMPRESSION: Cholecystostomy tube remains in place. No complicating feature by radiography.   Electronically Signed   By: Nelson Chimes M.D.   On: 09/04/2014 15:16   ASSESSMENT / PLAN:  PULMONARY OETT n/a A: Atelectasis - in setting of pain Respiratory Distress - brief post IR procedure  OSA - on CPAP at home P:   D/C BiPAP. CPAP while asleep. Pulmonary hygiene:  IS, mobilize. Now CXR to r/o pulmonary edema, infiltrate.  CARDIOVASCULAR CVL n/a A:  Severe Sepsis - in setting of acute cholecystis Chest Pain - on admit, neg troponin and EKG. pBNP 800.  Hypertension  CAF - on xarelto  P:  Increase lopressor back to 25 BID with holding parameters. Monitor BP closely, may need to d/c lopressor all together Plan to resume xarelto 12/5 pm but will defer to surgery. Continue Cardura Troponin x1   RENAL A:   No acute issues  P:  Trend BMP Replace electrolytes as indicated  GASTROINTESTINAL A:   Abdominal Pain  Acute Cholecystitis  P:   Per GI, CCS See ID Diet when ok with surgery.  HEMATOLOGIC A:   Mild Anemia  Chronic Anti-coagulation  P:  Monitor CBC Hold Xarelto 12/4   INFECTIOUS A:   Severe Sepsis - acute cholecystis + possible UTI (? contaminated source given recent rectal manipulation with colonoscopy) P:   BCx2 12/2 >> BCx2 12/4 >>  Gallbladder Fluid 12/4 >>  Abx: Zosyn, start date 12/2>>> Abx: Vanco, start date 12/2 x1   Abx: Levaquin 12/4 x 1  Monitor drain output Assess rectal temps, as pt's oral temps have been inaccurate  ENDOCRINE A:   No acute issues  P:   Monitor glucose on BMP  NEUROLOGIC A:   Pain  Arthritis  P:   Change to fentanyl for pain control  Minimize sedating medications as able   FAMILY  - Updates: Patient updated bedside.  Rush Farmer, M.D. [facility] Pulmonary/Critical Care Medicine. Pager: [phone]. After  hours pager: [phone].

## 2014-09-06 DIAGNOSIS — R0603 Acute respiratory distress: Secondary | ICD-10-CM | POA: Insufficient documentation

## 2014-09-06 DIAGNOSIS — J80 Acute respiratory distress syndrome: Secondary | ICD-10-CM

## 2014-09-06 DIAGNOSIS — E876 Hypokalemia: Secondary | ICD-10-CM

## 2014-09-06 LAB — BASIC METABOLIC PANEL
Anion gap: 12 (ref 5–15)
BUN: 21 mg/dL (ref 6–23)
CALCIUM: 8.6 mg/dL (ref 8.4–10.5)
CO2: 27 mEq/L (ref 19–32)
CREATININE: 0.92 mg/dL (ref 0.50–1.35)
Chloride: 97 mEq/L (ref 96–112)
GFR calc Af Amer: >90 mL/min (ref >90)
GFR calc non Af Amer: 79 mL/min — ABNORMAL LOW (ref >90)
GLUCOSE: 133 mg/dL — AB (ref 70–99)
Potassium: 3.1 mEq/L — ABNORMAL LOW (ref 3.7–5.3)
Sodium: 136 mEq/L — ABNORMAL LOW (ref 137–147)

## 2014-09-06 LAB — CBC
HCT: 33.2 % — ABNORMAL LOW (ref 39.0–52.0)
Hemoglobin: 11 g/dL — ABNORMAL LOW (ref 13.0–17.0)
MCH: 28.4 pg (ref 26.0–34.0)
MCHC: 33.1 g/dL (ref 30.0–36.0)
MCV: 85.8 fL (ref 78.0–100.0)
PLATELETS: 218 10*3/uL (ref 150–400)
RBC: 3.87 MIL/uL — AB (ref 4.22–5.81)
RDW: 14.9 % (ref 11.5–15.5)
WBC: 10.8 10*3/uL — ABNORMAL HIGH (ref 4.0–10.5)

## 2014-09-06 LAB — MAGNESIUM: Magnesium: 2.1 mg/dL (ref 1.5–2.5)

## 2014-09-06 LAB — PHOSPHORUS: Phosphorus: 2.1 mg/dL — ABNORMAL LOW (ref 2.3–4.6)

## 2014-09-06 MED ORDER — FUROSEMIDE 10 MG/ML IJ SOLN
40.0000 mg | Freq: Once | INTRAMUSCULAR | Status: AC
  Administered 2014-09-06: 40 mg via INTRAVENOUS
  Filled 2014-09-06: qty 4

## 2014-09-06 MED ORDER — POTASSIUM CHLORIDE CRYS ER 20 MEQ PO TBCR
20.0000 meq | EXTENDED_RELEASE_TABLET | Freq: Three times a day (TID) | ORAL | Status: AC
  Administered 2014-09-06 (×3): 20 meq via ORAL
  Filled 2014-09-06 (×3): qty 1

## 2014-09-06 NOTE — Progress Notes (Signed)
PULMONARY / CRITICAL CARE MEDICINE   Name: Cameron [name] MRN: [id number] DOB: [birth year]    ADMISSION DATE:  09/02/2014 CONSULTATION DATE:  09/04/14  REFERRING MD :  Dr. Rockne Menghini   CHIEF COMPLAINT:  SOB  INITIAL PRESENTATION:  80 y/o M admitted 12/2 with abdominal pain and confusion. Work up positive for acute cholecystitis and patient underwent perc drain placement on 12/4 per IR.  Patient developed decreased mental status & SOB during procedure and PCCM consulted for evaluation.    STUDIES:  12/02  CT HEAD >> no acute intracranial process 12/02  CT ABD/Pelvis >> acute cholecystitis without biliary dilation, equivocal stone in cystic duct, cardiomegaly & mild bibasilar atx  SIGNIFICANT EVENTS: 12/02  Admit with acute cholecystitis 12/04  IR perc cholecystitis drain placement.  Decompensated during procedure becoming SOB with decreased mental status  SUBJECTIVE: Off BiPAP since 8 PM last night, feels better off BiPAP, [place].  VITAL SIGNS: Temp:  [98.2 F (36.8 C)-98.7 F (37.1 C)] 98.7 F (37.1 C) (12/06 0800) Pulse Rate:  [88-109] 88 (12/06 1000) Resp:  [17-31] 30 (12/06 1000) BP: (118-193)/(58-85) 145/58 mmHg (12/06 1025) SpO2:  [94 %-99 %] 96 % (12/06 1000) Weight:  [91.1 kg (200 lb 13.4 oz)] 91.1 kg (200 lb 13.4 oz) (12/06 0500)   HEMODYNAMICS:   VENTILATOR SETTINGS:   INTAKE / OUTPUT:  Intake/Output Summary (Last 24 hours) at 09/06/14 1042 Last data filed at 09/06/14 0900  Gross per 24 hour  Intake   1475 ml  Output   1365 ml  Net    110 ml    PHYSICAL EXAMINATION: General:  wdwn elderly male in NAD Neuro:  Awake, alert, interacts appropriately  HEENT:  Mm pink/moist, bipap mask in place Cardiovascular:  s1s2 irr irr, rate 80's-110's, no m/r/g Lungs:  resp's even/non-labored on BiPAP, lungs bilaterally diminished but clear Abdomen:  Mildly tender on R side, drain in place with brown drainage Musculoskeletal:  No acute deformities  Skin:  Warm/dry, no edema, no  rashes or lesions  LABS:  CBC  Recent Labs Lab 09/04/14 0437 09/05/14 0330 09/06/14 0358  WBC 17.3* 13.3* 10.8*  HGB 11.7* 11.3* 11.0*  HCT 34.5* 34.2* 33.2*  PLT 172 205 218   Coag's  Recent Labs Lab 09/03/14 1601 09/03/14 1615 09/04/14 0437  APTT  --  48*  --   INR 2.85*  --  2.36*   BMET  Recent Labs Lab 09/04/14 0437 09/05/14 0330 09/06/14 0358  NA 137 140 136*  K 3.8 3.0* 3.1*  CL 100 100 97  CO2 22 26 27   BUN 32* 29* 21  CREATININE 1.29 1.12 0.92  GLUCOSE 153* 136* 133*   Electrolytes  Recent Labs Lab 09/04/14 0437 09/05/14 0330 09/06/14 0358  CALCIUM 8.7 9.0 8.6  MG  --   --  2.1  PHOS  --   --  2.1*   Sepsis Markers  Recent Labs Lab 09/02/14 1717  LATICACIDVEN 1.85   Liver Enzymes  Recent Labs Lab 09/03/14 0751 09/04/14 0437 09/05/14 0330  AST 32 22 24  ALT 26 21 19   ALKPHOS 92 87 104  BILITOT 4.0* 1.8* 1.2  ALBUMIN 2.7* 2.4* 2.3*   Cardiac Enzymes  Recent Labs Lab 09/02/14 1706 09/04/14 1027  TROPONINI  --  <0.30  PROBNP 800.4*  --    Glucose No results for input(s): GLUCAP in the last 168 hours.  Imaging Dg Chest Port 1 View  09/05/2014   CLINICAL DATA:  Acute onset of  respiratory distress. Subsequent encounter.  EXAM: PORTABLE CHEST - 1 VIEW  COMPARISON:  Chest radiograph performed 09/04/2014  FINDINGS: The lungs are hypoexpanded. Vascular congestion is noted. Increased interstitial markings raise concern for persistent pulmonary edema, perhaps slightly improved from the prior study. Mild bilateral atelectasis is also seen. No definite pleural effusion or pneumothorax is identified.  The cardiomediastinal silhouette is mildly enlarged. No acute osseous abnormalities are seen.  IMPRESSION: Vascular congestion and mild cardiomegaly. Increased interstitial markings raise concern for persistent pulmonary edema, perhaps slightly improved from the prior study. Lungs hypoexpanded, with mild bilateral atelectasis.    Electronically Signed   By: Garald Balding M.D.   On: 09/05/2014 05:56   ASSESSMENT / PLAN:  PULMONARY OETT n/a A: Atelectasis - in setting of pain Respiratory Distress - brief post IR procedure  OSA - on CPAP at home P:   CPAP while asleep, much more comfortable with his own mask. Pulmonary hygiene:  IS, mobilize. CXR with pulmonary edema, will give a dose of lasix.  CARDIOVASCULAR CVL n/a A:  Severe Sepsis - in setting of acute cholecystis Chest Pain - on admit, neg troponin and EKG. pBNP 800.  Hypertension  CAF - on xarelto  P:  Increased lopressor back to 25 BID with holding parameters. Xeralto restarted. Continue Cardura  RENAL A:   Hypokalemia P:   Trend BMP Replace electrolytes as indicated Single dose of lasix.  GASTROINTESTINAL A:   Abdominal Pain  Acute Cholecystitis  P:   Per GI, CCS See ID Diet when ok with surgery.  HEMATOLOGIC A:   Mild Anemia  Chronic Anti-coagulation  P:  Monitor CBC Xarelto 12/5  INFECTIOUS A:   Severe Sepsis - acute cholecystis + possible UTI (? contaminated source given recent rectal manipulation with colonoscopy) P:   BCx2 12/2 >> BCx2 12/4 >>  Gallbladder Fluid 12/4 >>  Abx: Zosyn, start date 12/2>>> Abx: Vanco, start date 12/2 x1   Abx: Levaquin 12/4 x 1  Monitor drain output  ENDOCRINE A:   No acute issues  P:   Monitor glucose on BMP  NEUROLOGIC A:   Pain  Arthritis  P:   Changed to fentanyl for pain control PRN Minimize sedating medications as able   FAMILY  - Updates: Patient updated bedside.  Transfer out of SDU, PCCM will sign off, please call back if needed.  Rush Farmer, M.D. [facility] Pulmonary/Critical Care Medicine. Pager: [phone]. After hours pager: [phone].

## 2014-09-06 NOTE — Progress Notes (Signed)
Report called [phone]. Pt being transferred to [id number]

## 2014-09-06 NOTE — Progress Notes (Signed)
Pt was offered assistance with his home cpap unit, but he declined.  Pt stated he was fine and could put in on himself when ready for bed.  Pt was advised that RT is available all night should he need further assistance.  When offered, pt stated he has enough water already in humidity chamber.

## 2014-09-06 NOTE — Plan of Care (Signed)
Problem: Phase I Progression Outcomes Goal: Pain controlled with appropriate interventions Outcome: Completed/Met Date Met:  09/06/14 Goal: OOB as tolerated unless otherwise ordered Outcome: Completed/Met Date Met:  09/06/14  Problem: Phase II Progression Outcomes Goal: Vital signs remain stable Outcome: Completed/Met Date Met:  09/06/14 Goal: IV changed to normal saline lock Outcome: Completed/Met Date Met:  09/06/14 Goal: Obtain order to discontinue catheter if appropriate Outcome: Not Applicable Date Met:  99/35/70 Goal: Other Phase II Outcomes/Goals Outcome: Completed/Met Date Met:  09/06/14

## 2014-09-06 NOTE — Progress Notes (Addendum)
Progress Note   Cameron [name] TML:[id number] DOB: 1933-12-29 DOA: 09/02/2014 PCP: Simona Huh, MD   Brief Narrative:   Cameron [name] is an 80 y.o. male with a PMH of hypertension, OSA, neuropathy, atrial fibrillation on chronic Xarelto and chronic diastolic heart failure who was admitted 09/02/14 with a chief complaint of RUQ pain and reports of confusion. He saw his PCP and was found to have hyperbilirubinemia. A CT scan of the abdomen, done on admission, showed acute cholecystitis. The patient underwent a cholecystostomy on 09/04/14, and became agitated with increased confusion during the procedure and post procedure had pain out of proportion to procedure performed.  He also required Bipap post procedure. A CTU scan was done which showed appropriate placement of his cholecystostomy tube with no evidence of complications.  Assessment/Plan:   Principal Problem:   Acute cholecystitis with hyperlipasemia and hyperbilirubinemia/cholelithiasis/choledocholithiasis  CT findings on admission as noted below. Stone noted in the cystic duct.  Continue empiric IV Zosyn. Follow-up blood cultures done 09/02/14 and 09/04/14 and culture of bile from gallbladder.  S/P surgical consultation /GI consultations.  Dr. Cristina Gong did not think he had choledocholithiasis and did not recommend ERCP.  S/P U/S which showed a stone in the gallbladder neck, normal bile duct caliber.    Underwent cholecystotomy 09/04/14. Postprocedure CT scan as noted below.  Medically stable enough to transfer to floor.  Active Problems:   Diarrhea  C. Diff. Negative.  D/C contact precautions.    Abdominal pain  Fentanyl ordered as needed.    Hyperglycemia/impaired fasting glucose  Hemoglobin A1c 6%, corresponding to a mean plasma glucose of 126. The patient is at risk for developing type 2 diabetes.  Dietician consult for diet education.    Acute confusional state  Continue neuro checks.  CT of the head  negative on admission.  Delirium is likely related to acute illness, has improved with clinical stability.    Chronic atrial fibrillation / chronic anticoagulation  Xarelto held on admission, resumed 09/05/14.  Continue oral metoprolol and IV metoprolol as needed for heart rate greater than 100    Hypertension  Continue Cardura and metoprolol.    Acute on chronic diastolic heart failure / elevated BNP  With decompensation in respiratory status post procedure 09/04/14 and chest x-ray showing worsening pulmonary edema, the patient has been diuresed as needed.  Monitor fluid volume status closely.    Acute respiratory failure/OSA (obstructive sleep apnea)  Continue CPAP as needed.    Hypokalemia  Replete orally.  Secondary to diuresis.    DVT Prophylaxis  Resume Xarelto.  Code Status: Full. Family Communication: Son, Vadim Centola updated by telephone today. Disposition Plan: Home when stable.   IV Access:    Peripheral IV   Procedures and diagnostic studies:   Dg Chest 2 View 09/02/2014: 1. Mild stable cardiac enlargement. 2. Low lung volumes with vascular crowding and streaky bibasilar atelectasis. 3. No pneumothorax or free intra-abdominal air.     Dg Ribs Unilateral Right 09/02/2014: No RIGHT rib abnormalities are radiographically identified.     Ct Head Wo Contrast 09/02/2014: No acute intracranial process ; normal noncontrast CT of the head for age.     Ct Abdomen Pelvis W Contrast 09/02/2014: Acute cholecystitis without biliary dilatation. Equivocal stone in the cystic duct identified.  Small amount of free pelvic fluid.  Cardiomegaly and mild bibasilar atelectasis.     Dg Abd Acute W/chest 09/02/2014: 1. Stable cardiac enlargement. 2. Low lung volumes with vascular crowding,  vascular congestion and bibasilar atelectasis. 3. Possible early or partial small bowel obstruction.     US Abdomen Limited Ruq 09/03/2014: Solitary gallstone is noted in the neck of the  gallbladder with sludge present within gallbladder lumen. Mild gallbladder wall thickening is noted and these findings may represent acute cholecystitis. HIDA scan may be performed for further evaluation.  Fatty infiltration of the liver is noted. Small amount of free fluid is seen adjacent to the right hepatic lobe.     Ct Abdomen Pelvis W Contrast 09/04/2014: 1. Interval percutaneous cholecystostomy tube placement with the tip of the catheter in the gallbladder neck. The gallbladder is decompressed. There are persistent pericholecystic inflammatory changes. There is new small amount of perihepatic fluid which is low density likely reflecting bile or simple fluid secondary to bile peritonitis. 2. New small right pleural effusion and compressive atelectasis. Trace left pleural effusion and mild atelectasis. No pneumothorax.     Ir Perc Cholecystostomy 09/04/2014: Status post percutaneous cholecystostomy.  PLAN: The patient became acutely combative and agitated during the procedure. Hemodynamically, there was no hypotension, though the patient was tachycardic. No drop in the oxygenation. This response may have a then acute bacteremia/sepsis, or alternatively this may be related to pain and paradoxical reaction to the sedation medication.  The patient was transported directly to his bed in knee ICU, and communication of the patient's change in mental status was communicated to the ICU team in his primary physician.  Consideration may be for bacteremia/sepsis versus paradoxical reaction to sedation.  Chest x-ray and correlation with cardiac markers may be considered.    Dg Chest Port 1 View 09/04/2014: Pulmonary edema, more pronounced than on the previous study.     Dg Abd Portable 1v 09/04/2014: Cholecystostomy tube remains in place. No complicating feature by radiography.   Electronically Signed   By: Nelson Chimes M.D.   On: 09/04/2014 15:16   Dg Chest Port 1 View 09/05/2014: Vascular congestion and mild  cardiomegaly. Increased interstitial markings raise concern for persistent pulmonary edema, perhaps slightly improved from the prior study. Lungs hypoexpanded, with mild bilateral atelectasis.      Medical Consultants:    Dr. Donnie Mesa, Surgery  Dr. Ronald Lobo, GI  Dr. Baltazar Apo, PCCM  Anti-Infectives:    Vancomycin 09/02/14---> 09/02/14  Zosyn 09/02/14--->   Subjective:   Cameron [name] is doing well this morning.  Minimal pain/soreness at drain insertion sight.  Dyspnea improved.  No cough.  Tolerating liquids.  Objective:    Filed Vitals:   09/06/14 0200 09/06/14 0400 09/06/14 0500 09/06/14 0600  BP: 148/74 119/68  152/73  Pulse: 96 90  98  Temp:  98.2 F (36.8 C)    TempSrc:  Oral    Resp: 28 17  21   Height:      Weight:   91.1 kg (200 lb 13.4 oz)   SpO2: 95% 97%  94%    Intake/Output Summary (Last 24 hours) at 09/06/14 0743 Last data filed at 09/06/14 0600  Gross per 24 hour  Intake   1705 ml  Output   1565 ml  Net    140 ml    Exam: Gen:  NAD Cardiovascular:  HSIR, No M/R/G Respiratory:  Lungs CTAB Gastrointestinal:  Abdomen soft, RUQ pain, + BS, percutaneous drain in place RUQ Extremities:  1+ edema   Data Reviewed:    Labs: Basic Metabolic Panel:  Recent Labs Lab 09/02/14 1706 09/03/14 0751 09/04/14 0437 09/05/14 0330 09/06/14 0358  NA 132*  135* 137 140 136*  K 3.7 4.0 3.8 3.0* 3.1*  CL 92* 96 100 100 97  CO2 20 22 22 26 27   GLUCOSE 193* 135* 153* 136* 133*  BUN 14 18 32* 29* 21  CREATININE 1.07 1.17 1.29 1.12 0.92  CALCIUM 8.9 8.9 8.7 9.0 8.6  MG  --   --   --   --  2.1  PHOS  --   --   --   --  2.1*   GFR Estimated Creatinine Clearance: 75.1 mL/min (by C-G formula based on Cr of 0.92). Liver Function Tests:  Recent Labs Lab 09/02/14 1706 09/03/14 0751 09/04/14 0437 09/05/14 0330  AST 39* 32 22 24  ALT 26 26 21 19   ALKPHOS 94 92 87 104  BILITOT 4.0* 4.0* 1.8* 1.2  PROT 7.2 7.0 6.4 6.5  ALBUMIN 2.9* 2.7*  2.4* 2.3*    Recent Labs Lab 09/02/14 1706 09/03/14 0751  LIPASE 145* 40   CBC:  Recent Labs Lab 09/02/14 1706 09/03/14 0751 09/04/14 0437 09/05/14 0330 09/06/14 0358  WBC 19.2* 21.0* 17.3* 13.3* 10.8*  NEUTROABS 16.7*  --   --   --   --   HGB 12.6* 12.6* 11.7* 11.3* 11.0*  HCT 37.5* 36.7* 34.5* 34.2* 33.2*  MCV 85.0 85.0 85.6 85.9 85.8  PLT 149* 147* 172 205 218   BNP (last 3 results)  Recent Labs  08/19/14 0851 09/02/14 1706  PROBNP 26.0 800.4*   Sepsis Labs:  Recent Labs Lab 09/02/14 1717 09/03/14 0751 09/04/14 0437 09/05/14 0330 09/06/14 0358  WBC  --  21.0* 17.3* 13.3* 10.8*  LATICACIDVEN 1.85  --   --   --   --    Microbiology Recent Results (from the past 240 hour(s))  Culture, blood (routine x 2)     Status: None (Preliminary result)   Collection Time: 09/02/14  5:07 PM  Result Value Ref Range Status   Specimen Description BLOOD BLOOD LEFT FOREARM  Final   Special Requests BOTTLES DRAWN AEROBIC AND ANAEROBIC 5 CC  Final   Culture  Setup Time   Final    09/02/2014 23:04 Performed at [facility]    Culture   Final           BLOOD CULTURE RECEIVED NO GROWTH TO DATE CULTURE WILL BE HELD FOR 5 DAYS BEFORE ISSUING A FINAL NEGATIVE REPORT Performed at [facility]    Report Status PENDING  Incomplete  Culture, blood (routine x 2)     Status: None (Preliminary result)   Collection Time: 09/02/14  5:07 PM  Result Value Ref Range Status   Specimen Description BLOOD BLOOD RIGHT FOREARM  Final   Special Requests BOTTLES DRAWN AEROBIC AND ANAEROBIC 5 CC  Final   Culture  Setup Time   Final    09/02/2014 23:04 Performed at [facility]    Culture   Final           BLOOD CULTURE RECEIVED NO GROWTH TO DATE CULTURE WILL BE HELD FOR 5 DAYS BEFORE ISSUING A FINAL NEGATIVE REPORT Performed at [facility]    Report Status PENDING  Incomplete  MRSA PCR Screening     Status: Abnormal   Collection Time: 09/03/14 12:27 AM    Result Value Ref Range Status   MRSA by PCR INVALID RESULTS, SPECIMEN SENT FOR CULTURE (A) NEGATIVE Final    Comment: SPOKE WITH CROFPS,S RN [phone] [id number] [place],N        The  GeneXpert MRSA Assay (FDA approved for NASAL specimens only), is one component of a comprehensive MRSA colonization surveillance program. It is not intended to diagnose MRSA infection nor to guide or monitor treatment for MRSA infections.   MRSA culture     Status: None   Collection Time: 09/03/14 12:27 AM  Result Value Ref Range Status   Specimen Description NASOPHARYNGEAL  Final   Special Requests NONE  Final   Culture   Final    NO STAPHYLOCOCCUS AUREUS ISOLATED Note: No MRSA Isolated Performed at [facility]    Report Status 09/05/2014 FINAL  Final  Culture, routine-abscess     Status: None (Preliminary result)   Collection Time: 09/04/14 10:01 AM  Result Value Ref Range Status   Specimen Description GALL BLADDER  Final   Special Requests Normal  Final   Gram Stain   Final    NO WBC SEEN NO SQUAMOUS EPITHELIAL CELLS SEEN NO ORGANISMS SEEN Performed at [facility]    Culture   Final    NO GROWTH 2 DAYS Performed at [facility]    Report Status PENDING  Incomplete  Anaerobic culture     Status: None (Preliminary result)   Collection Time: 09/04/14 10:01 AM  Result Value Ref Range Status   Specimen Description GALL BLADDER ABSCESS  Final   Special Requests Normal  Final   Gram Stain   Final    NO WBC SEEN NO SQUAMOUS EPITHELIAL CELLS SEEN NO ORGANISMS SEEN Performed at [facility]    Culture   Final    NO ANAEROBES ISOLATED; CULTURE IN PROGRESS FOR 5 DAYS Performed at [facility]    Report Status PENDING  Incomplete  Culture, blood (routine x 2)     Status: None (Preliminary result)   Collection Time: 09/04/14 10:27 AM  Result Value Ref Range Status   Specimen Description BLOOD RIGHT ARM  Final   Special Requests BOTTLES DRAWN AEROBIC  AND ANAEROBIC 10CC  Final   Culture  Setup Time   Final    09/04/2014 12:39 Performed at [facility]    Culture   Final           BLOOD CULTURE RECEIVED NO GROWTH TO DATE CULTURE WILL BE HELD FOR 5 DAYS BEFORE ISSUING A FINAL NEGATIVE REPORT Performed at [facility]    Report Status PENDING  Incomplete  Culture, blood (routine x 2)     Status: None (Preliminary result)   Collection Time: 09/04/14 10:30 AM  Result Value Ref Range Status   Specimen Description BLOOD LEFT HAND  Final   Special Requests BOTTLES DRAWN AEROBIC AND ANAEROBIC [facility]  Final   Culture  Setup Time   Final    09/04/2014 12:39 Performed at [facility]    Culture   Final           BLOOD CULTURE RECEIVED NO GROWTH TO DATE CULTURE WILL BE HELD FOR 5 DAYS BEFORE ISSUING A FINAL NEGATIVE REPORT Performed at [facility]    Report Status PENDING  Incomplete  Clostridium Difficile by PCR     Status: None   Collection Time: 09/05/14 10:21 AM  Result Value Ref Range Status   C difficile by pcr NEGATIVE NEGATIVE Final    Comment: Performed at [facility]     Medications:   . antiseptic oral rinse  7 mL Mouth Rinse q12n4p  . bimatoprost  1 drop Both Eyes QHS  . chlorhexidine  15 mL Mouth Rinse BID  . doxazosin  4 mg Oral Daily  . metoprolol tartrate  25 mg Oral BID  . piperacillin-tazobactam (ZOSYN)  IV  3.375 g Intravenous Q8H  . rivaroxaban  20 mg Oral Q supper  . sodium chloride  3 mL Intravenous Q12H   Continuous Infusions:    Time spent: 25 minutes.    LOS: 4 days   [facility] Pager [phone]. If unable to reach me by pager, please call my cell phone at [phone].  *Please refer to [email or web address], password [email or web address] to get updated schedule on who will round on this patient, as hospitalists switch teams weekly. If 7PM-7AM, please contact night-coverage at [email or web address], password [email or web address] for any overnight needs.  09/06/2014, 7:43 AM

## 2014-09-06 NOTE — Progress Notes (Addendum)
General Surgery Note  LOS: 4 days  POD -     Assessment/Plan: 1.  Acute cholecystitis - Perc drain - 09/04/2014 - Dr. Rolla Plate  Levaquin/Zosyn  WBC better  Tolerated clear liquids - to advance to full liquids.  2.  A. Fib 3.  Chronic anticoagulation  INR - 2.36 on 09/04/2014  On Xarelto 4.  HTN 5.  OSA 6.  Chronic CHF 7.  DVT prophylaxis - on hold for anticoagulation 8.  Some loose stools - C. Diff neg - 09/05/2014   Principal Problem:   Acute cholecystitis Active Problems:   Chronic atrial fibrillation   Hypertension   Chronic anticoagulation   Chronic diastolic heart failure   OSA (obstructive sleep apnea)   Elevated lipase   Elevated brain natriuretic peptide (BNP) level   Elevated bilirubin   Hyperglycemia   Acute confusional state   Cholelithiasis   Choledocholithiasis   IFG (impaired fasting glucose)   Acute respiratory failure   Acute on chronic diastolic CHF (congestive heart failure)   Hypokalemia   Abdominal pain   Subjective:  Tolerated liquids.  Overall better.  Son Cameron [name]:  [phone].   Objective:   Filed Vitals:   09/06/14 0600  BP: 152/73  Pulse: 98  Temp:   Resp: 21     Intake/Output from previous day:  12/05 0701 - 12/06 0700 In: 1017 [P.O.:1210; I.V.:130; IV Piggyback:350] Out: 5102 [Urine:1500; Drains:65]  Intake/Output this shift:      Physical Exam:   General: WN older WM who is alert.     HEENT: Normal. Pupils equal. .   Lungs: Clear.  IS about 600 cc.   Abdomen: Soft.  Well healed midline scar from prior colon resection. Perc drain in RUQ. 65 cc recorded last 24 hours.   Lab Results:     Recent Labs  09/05/14 0330 09/06/14 0358  WBC 13.3* 10.8*  HGB 11.3* 11.0*  HCT 34.2* 33.2*  PLT 205 218    BMET    Recent Labs  09/05/14 0330 09/06/14 0358  NA 140 136*  K 3.0* 3.1*  CL 100 97  CO2 26 27  GLUCOSE 136* 133*  BUN 29* 21  CREATININE 1.12 0.92  CALCIUM 9.0 8.6    PT/INR    Recent Labs   09/03/14 1601 09/04/14 0437  LABPROT 30.1* 26.0*  INR 2.85* 2.36*    ABG  No results for input(s): PHART, HCO3 in the last 72 hours.  Invalid input(s): PCO2, PO2   Studies/Results:  Ct Abdomen Pelvis W Contrast  09/04/2014   CLINICAL DATA:  Percutaneous cholecystostomy tube placed earlier today 09/04/2014 draining large amounts of dark bile. Right side abdominal pain and shoulder pain.  EXAM: CT ABDOMEN AND PELVIS WITH CONTRAST  TECHNIQUE: Multidetector CT imaging of the abdomen and pelvis was performed using the standard protocol following bolus administration of intravenous contrast.  CONTRAST:  15mL OMNIPAQUE IOHEXOL 300 MG/ML  SOLN  COMPARISON:  09/02/2014  FINDINGS: There is a new small right pleural effusion with associated compressive atelectasis. There is a trace left pleural effusion with associated atelectasis. There is no pneumothorax.  There is a 6.4 x 4.3 cm hypodense, fluid attenuating left hepatic mass consistent with a cyst. There is no intrahepatic or extrahepatic biliary ductal dilatation.  There is interval percutaneous cholecystostomy with a pigtail catheter coiled in the neck of the gallbladder. There is a small amount of perihepatic low density fluid which is new compared with the prior examination likely reflecting a  small amount of bile and possibly resulting in Mild bile peritonitis. There are pericholecystic inflammatory changes again noted.  The spleen demonstrates no focal abnormality. The kidneys, adrenal glands and pancreas are normal. The bladder is unremarkable.  The stomach, duodenum, small intestine, and large intestine demonstrate no bowel wall thickening or dilatation. There is no pneumoperitoneum, pneumatosis, or portal venous gas. There is no lymphadenopathy.  The abdominal aorta is normal in caliber with atherosclerosis.  There are no lytic or sclerotic osseous lesions. There is lumbar spine spondylosis.  IMPRESSION: 1. Interval percutaneous cholecystostomy tube  placement with the tip of the catheter in the gallbladder neck. The gallbladder is decompressed. There are persistent pericholecystic inflammatory changes. There is new small amount of perihepatic fluid which is low density likely reflecting bile or simple fluid secondary to bile peritonitis. 2. New small right pleural effusion and compressive atelectasis. Trace left pleural effusion and mild atelectasis. No pneumothorax.   Electronically Signed   By: Kathreen Devoid   On: 09/04/2014 16:38   Ir Perc Cholecystostomy  09/04/2014   INDICATION: Acute cholecystitis.  The patient has been been prescribed is a relative 0, with the last dose 09/02/2014. The family and the patient understand increased bleeding risk, given that there has been only 48 hours off the medication. The benefit of doing the procedure on this patient hospitalized in the ICU outweighs the risk of the potential bleed.  EXAM: ULTRASOUND AND FLUOROSCOPIC-GUIDED CHOLECYSTOSTOMY TUBE PLACEMENT  COMPARISON:  None.  MEDICATIONS: Fentanyl 100 mcg IV; Versed 2 mg IV;  250 mg Levaquin IV  Procedural  ANESTHESIA/SEDATION: Total Moderate Sedation Time  Seventeen minutes  CONTRAST:  42mL OMNIPAQUE IOHEXOL 300 MG/ML  SOLN  FLUOROSCOPY TIME:  0 minutes minutes 48 seconds  COMPLICATIONS: None  PROCEDURE: Informed written consent was obtained from the patient after a discussion of the risks, benefits and alternatives to treatment. Questions regarding the procedure were encouraged and answered. A timeout was performed prior to the initiation of the procedure.  The right upper abdominal quadrant was prepped and draped in the usual sterile fashion, and a sterile drape was applied covering the operative field. Maximum barrier sterile technique with sterile gowns and gloves were used for the procedure. A timeout was performed prior to the initiation of the procedure. Local anesthesia was provided with 1% lidocaine with epinephrine.  Ultrasound scanning of the right upper  quadrant demonstrates a markedly dilated gallbladder. Of note, the patient reported pain with ultrasound imaging over the gallbladder. Utilizing a transhepatic approach, a 22 gauge needle was advanced into the gallbladder under direct ultrasound guidance. An ultrasound image was saved for documentation purposes. Appropriate intraluminal puncture was confirmed with the efflux of bile and advancement of an 0.018 wire into the gallbladder lumen. The needle was exchanged for an [place] set. A small amount of contrast was injected to confirm appropriate intraluminal positioning. Over a [place] wire, a 37.2-[place] cholecystomy tube was advanced into the gallbladder fossa, coiled and locked. Bile was aspirated and a small amount of contrast was injected as several post procedural spot radiographic images were obtained in various obliquities. The catheter was secured to the skin with suture, connected to a drainage bag and a dressing was placed. The patient tolerated the procedure well without immediate post procedural complication.  IMPRESSION: Status post percutaneous cholecystostomy.  PLAN: The patient became acutely combative and agitated during the procedure. Hemodynamically, there was no hypotension, though the patient was tachycardic. No drop in the oxygenation. This response may have  a then acute bacteremia/sepsis, or alternatively this may be related to pain and paradoxical reaction to the sedation medication.  The patient was transported directly to his bed in knee ICU, and communication of the patient's change in mental status was communicated to the ICU team in his primary physician.  Consideration may be for bacteremia/sepsis versus paradoxical reaction to sedation.  Chest x-ray and correlation with cardiac markers may be considered.  Signed,  Dulcy Fanny. Earleen Newport, DO  [facility]  [facility]   Electronically Signed   By: Corrie Mckusick D.O.   On: 09/04/2014 11:17    Dg Chest Port 1 View  09/05/2014   CLINICAL DATA:  Acute onset of respiratory distress. Subsequent encounter.  EXAM: PORTABLE CHEST - 1 VIEW  COMPARISON:  Chest radiograph performed 09/04/2014  FINDINGS: The lungs are hypoexpanded. Vascular congestion is noted. Increased interstitial markings raise concern for persistent pulmonary edema, perhaps slightly improved from the prior study. Mild bilateral atelectasis is also seen. No definite pleural effusion or pneumothorax is identified.  The cardiomediastinal silhouette is mildly enlarged. No acute osseous abnormalities are seen.  IMPRESSION: Vascular congestion and mild cardiomegaly. Increased interstitial markings raise concern for persistent pulmonary edema, perhaps slightly improved from the prior study. Lungs hypoexpanded, with mild bilateral atelectasis.   Electronically Signed   By: Garald Balding M.D.   On: 09/05/2014 05:56   Dg Chest Port 1 View  09/04/2014   CLINICAL DATA:  Acute respiratory distress. Personal history of hypertension. Shortness of breath.  EXAM: PORTABLE CHEST - 1 VIEW  COMPARISON:  09/02/2014  FINDINGS: The heart is enlarged. There is interstitial and early alveolar edema. The may be a small amount of pleural fluid. No consolidation or lobar collapse.  IMPRESSION: Pulmonary edema, more pronounced than on the previous study.   Electronically Signed   By: Nelson Chimes M.D.   On: 09/04/2014 12:19   Dg Abd Portable 1v  09/04/2014   CLINICAL DATA:  Abdominal pain, not otherwise specified. Previous history stated right-sided abdominal and pelvic pain following colonoscopy. Acute cholecystitis. Percutaneous cholecystostomy.  EXAM: PORTABLE ABDOMEN - 1 VIEW  COMPARISON:  09/04/2014.  09/02/2014.  FINDINGS: Drainage catheter in the right upper quadrant consistent with cholecystostomy. No sign of free air. Bowel gas pattern does not show evidence of obstruction. Chronic degenerative changes affect the spine.  IMPRESSION: Cholecystostomy  tube remains in place. No complicating feature by radiography.   Electronically Signed   By: Nelson Chimes M.D.   On: 09/04/2014 15:16     Anti-infectives:   Anti-infectives    Start     Dose/Rate Route Frequency Ordered Stop   09/04/14 2000  piperacillin-tazobactam (ZOSYN) IVPB 3.375 g     3.375 g12.5 mL/hr over 240 Minutes Intravenous Every 8 hours 09/04/14 1316     09/04/14 0845  Levofloxacin (LEVAQUIN) IVPB 250 mg  Status:  Discontinued     250 mg50 mL/hr over 60 Minutes Intravenous Every 24 hours 09/04/14 0845 09/05/14 1251   09/03/14 0200  piperacillin-tazobactam (ZOSYN) IVPB 3.375 g  Status:  Discontinued     3.375 g12.5 mL/hr over 240 Minutes Intravenous Every 8 hours 09/02/14 2327 09/04/14 1316   09/02/14 1700  vancomycin (VANCOCIN) 2,000 mg in sodium chloride 0.9 % 500 mL IVPB     2,000 mg250 mL/hr over 120 Minutes Intravenous  Once 09/02/14 1658 09/02/14 2002   09/02/14 1700  piperacillin-tazobactam (ZOSYN) IVPB 3.375 g     3.375 g100 mL/hr over 30 Minutes  Intravenous  Once 09/02/14 1658 09/02/14 1803      Alphonsa Overall, MD, FACS Pager: [phone] Surgery Office: [phone] 09/06/2014

## 2014-09-07 DIAGNOSIS — R1084 Generalized abdominal pain: Secondary | ICD-10-CM

## 2014-09-07 LAB — CBC
HCT: 37.3 % — ABNORMAL LOW (ref 39.0–52.0)
Hemoglobin: 12.4 g/dL — ABNORMAL LOW (ref 13.0–17.0)
MCH: 28.8 pg (ref 26.0–34.0)
MCHC: 33.2 g/dL (ref 30.0–36.0)
MCV: 86.5 fL (ref 78.0–100.0)
PLATELETS: 235 10*3/uL (ref 150–400)
RBC: 4.31 MIL/uL (ref 4.22–5.81)
RDW: 14.6 % (ref 11.5–15.5)
WBC: 10 10*3/uL (ref 4.0–10.5)

## 2014-09-07 LAB — BASIC METABOLIC PANEL
ANION GAP: 15 (ref 5–15)
BUN: 18 mg/dL (ref 6–23)
CHLORIDE: 98 meq/L (ref 96–112)
CO2: 27 mEq/L (ref 19–32)
CREATININE: 0.94 mg/dL (ref 0.50–1.35)
Calcium: 8.8 mg/dL (ref 8.4–10.5)
GFR calc Af Amer: >90 mL/min (ref >90)
GFR calc non Af Amer: 78 mL/min — ABNORMAL LOW (ref >90)
GLUCOSE: 139 mg/dL — AB (ref 70–99)
Potassium: 3.3 mEq/L — ABNORMAL LOW (ref 3.7–5.3)
Sodium: 140 mEq/L (ref 137–147)

## 2014-09-07 LAB — CULTURE, ROUTINE-ABSCESS
Culture: NO GROWTH
GRAM STAIN: NONE SEEN
SPECIAL REQUESTS: NORMAL

## 2014-09-07 LAB — MAGNESIUM: Magnesium: 2 mg/dL (ref 1.5–2.5)

## 2014-09-07 LAB — PHOSPHORUS: Phosphorus: 2.7 mg/dL (ref 2.3–4.6)

## 2014-09-07 MED ORDER — FOLIC ACID 1 MG PO TABS
1.0000 mg | ORAL_TABLET | Freq: Every day | ORAL | Status: DC
  Administered 2014-09-07 – 2014-09-08 (×2): 1 mg via ORAL
  Filled 2014-09-07 (×2): qty 1

## 2014-09-07 MED ORDER — FUROSEMIDE 40 MG PO TABS
40.0000 mg | ORAL_TABLET | Freq: Every day | ORAL | Status: DC
  Administered 2014-09-07: 40 mg via ORAL
  Filled 2014-09-07 (×2): qty 1

## 2014-09-07 MED ORDER — FENTANYL CITRATE 0.05 MG/ML IJ SOLN: 50.0000 ug | INTRAMUSCULAR | Status: DC | PRN

## 2014-09-07 MED ORDER — POTASSIUM CHLORIDE CRYS ER 20 MEQ PO TBCR
40.0000 meq | EXTENDED_RELEASE_TABLET | Freq: Once | ORAL | Status: AC
  Administered 2014-09-07: 40 meq via ORAL
  Filled 2014-09-07: qty 2

## 2014-09-07 NOTE — Evaluation (Signed)
Physical Therapy One Time Evaluation Patient Details Name: Cameron [name] MRN: [id number] DOB: [birth year] Today's Date: 09/07/2014   History of Present Illness  80 y.o. male with a PMH of hypertension, OSA, neuropathy, atrial fibrillation on chronic Xarelto and chronic diastolic heart failure who was admitted 09/02/14 with a chief complaint of RUQ pain and reports of confusion. Pt underwent a cholecystostomy with per biliary drain placed on 09/04/14, and became agitated with increased confusion during the procedure.  Clinical Impression  Patient evaluated by Physical Therapy with no further acute PT needs identified. All education has been completed and the patient has no further questions.  Pt encouraged to ambulate in hallways (reports only been allowed to move around room) with IV pole or RW if feeling unsteady.  Pt requests RW for home in case he needs assistive device as he will be home alone.  Pt very pleasant and agreeable to continuing mobilizing on his own while in hospital however aware to request PT again if he declines in functional status or needs change.  No further follow-up Physical Therapy  needs identified at this time.. PT is signing off. Thank you for this referral.     Follow Up Recommendations No PT follow up    Equipment Recommendations  Rolling walker with 5" wheels    Recommendations for Other Services       Precautions / Restrictions Precautions Precautions: None      Mobility  Bed Mobility               General bed mobility comments: up in recliner on arrival  Transfers Overall transfer level: Modified independent                  Ambulation/Gait Ambulation/Gait assistance: Supervision;Min guard Ambulation Distance (Feet): 600 Feet Assistive device:  (with IV pole) Gait Pattern/deviations: Step-through pattern     General Gait Details: pt with slow pace however reports not ambulating in a few days (longer distance then room), steady with  no LOB observed as pt pushed IV pole  Stairs            Wheelchair Mobility    Modified Rankin (Stroke Patients Only)       Balance                                             Pertinent Vitals/Pain Pain Assessment: No/denies pain    Home Living Family/patient expects to be discharged to:: Private residence Living Arrangements: Alone   Type of Home: House         Home Equipment: Wheelchair - power Additional Comments: Pt reports he has his deceased wife's power chair (had MS)    Prior Function Level of Independence: Independent               Hand Dominance        Extremity/Trunk Assessment               Lower Extremity Assessment: Overall WFL for tasks assessed         Communication   Communication: No difficulties  Cognition Arousal/Alertness: Awake/alert Behavior During Therapy: WFL for tasks assessed/performed Overall Cognitive Status: Within Functional Limits for tasks assessed                      General Comments      Exercises  Assessment/Plan    PT Assessment All further PT needs can be met in the next venue of care;Patent does not need any further PT services  PT Diagnosis     PT Problem List    PT Treatment Interventions     PT Goals (Current goals can be found in the Care Plan section) Acute Rehab PT Goals PT Goal Formulation: All assessment and education complete, DC therapy    Frequency     Barriers to discharge        Co-evaluation               End of Session   Activity Tolerance: Patient tolerated treatment well Patient left: in chair;with call bell/phone within reach Nurse Communication: Mobility status (encouraged RN to allow pt to ambulate in hallways)         Time: 1120-1141 PT Time Calculation (min) (ACUTE ONLY): 21 min   Charges:   PT Evaluation $Initial PT Evaluation Tier I: 1 Procedure PT Treatments $Gait Training: 8-22 mins   PT G Codes:           Harvir Patry,KATHrine E 09/07/2014, 12:03 PM  Carmelia Bake, PT, DPT 09/07/2014 Pager: [phone]

## 2014-09-07 NOTE — Progress Notes (Signed)
CARE MANAGEMENT NOTE 09/07/2014  Patient:  Cameron [name], Cameron [name]   Account Number:  [id number]  Date Initiated:  09/03/2014  Documentation initiated by:  DAVIS,RHONDA  Subjective/Objective Assessment:   pt with urosepsis and ams recent gi surgerical procedure     Action/Plan:   home when stable   Anticipated DC Date:  09/06/2014   Anticipated DC Plan:  HOME/SELF CARE  In-house referral  NA      DC Planning Services  CM consult      PAC Choice  NA   Choice offered to / List presented to:  NA   DME arranged  NA      DME agency  NA     [place] arranged  NA      [place] agency  NA   Status of service:  In process, will continue to follow Medicare Important Message given?   (If response is "NO", the following Medicare IM given date fields will be blank) Date Medicare IM given:   Medicare IM given by:   Date Additional Medicare IM given:   Additional Medicare IM given by:    Discharge Disposition:    Per UR Regulation:  Reviewed for med. necessity/level of care/duration of stay  If discussed at [place] of Stay Meetings, dates discussed:    Comments:  09/07/14 Edwyna Shell RN BSN CM [phone] transfered from ICU to [place] on 12/6. Patient stated that he lives home alone with support of his son. He stated that he does not feel that he needs [facility] RN for discharge follow up. Will continue to follow for any discharge needs  [id number]/Rhonda Davis,RN,BSN,CCM

## 2014-09-07 NOTE — Plan of Care (Signed)
Problem: Food- and Nutrition-Related Knowledge Deficit (NB-1.1) Goal: Nutrition education Formal process to instruct or train a patient/client in a skill or to impart knowledge to help patients/clients voluntarily manage or modify food choices and eating behavior to maintain or improve health. Outcome: Completed/Met Date Met:  09/07/14  RD consulted for nutrition education regarding hyperglycemia and HgbA1c of 6.0     Lab Results  Component Value Date    HGBA1C 6.0* 09/03/2014    RD provided "Plate Method" handout. Discussed different food groups and their effects on blood sugar, emphasizing carbohydrate-containing foods. Provided diagram of carbohydrates and recommended serving sizes of common foods.  Discussed importance of controlled and consistent carbohydrate intake throughout the day. Provided examples of ways to balance meals/snacks and encouraged intake of high-fiber, whole grain complex carbohydrates. Teach back method used.  Expect fair compliance.  Body mass index is 28.9 kg/(m^2). Pt meets criteria for overweight based on current BMI.  Current diet order is heart healthy, patient is consuming approximately 75% of meals at this time. Labs and medications reviewed. No further nutrition interventions warranted at this time. RD contact information provided. If additional nutrition issues arise, please re-consult RD.  Clayton Bibles, MS, RD, LDN Pager: [phone] After Hours Pager: [phone]

## 2014-09-07 NOTE — Progress Notes (Signed)
  Subjective: He is clearly feeling better.  He knows he just got full liquids yesterday.  Drain site is ok, it has some drainage around it when it was changed by nursing this Am.  He is mentally clearer, but still not completely with it.  Objective: Vital signs in last 24 hours: Temp:  [97.9 F (36.6 C)-99.1 F (37.3 C)] 97.9 F (36.6 C) (12/07 0617) Pulse Rate:  [88-128] 92 (12/07 0617) Resp:  [18-30] 18 (12/07 0617) BP: (131-193)/(58-75) 144/65 mmHg (12/07 0617) SpO2:  [93 %-97 %] 97 % (12/07 0617) Weight:  [91.354 kg (201 lb 6.4 oz)] 91.354 kg (201 lb 6.4 oz) (12/07 0617) Last BM Date: 09/06/14 640 PO Afebrile last 48 hours, VSS K+ is low, WBC is back to normal H/H is up slightly.    Intake/Output from previous day: 12/06 0701 - 12/07 0700 In: 2295 [P.O.:640; I.V.:1500; IV Piggyback:150] Out: 920 [Urine:900; Drains:20] Intake/Output this shift:    General appearance: alert, cooperative and no distress Resp: clear to auscultation bilaterally GI: soft, less tender, drain site looks better, he is not having pain like he did before.  Lab Results:   Recent Labs  09/06/14 0358 09/07/14 0515  WBC 10.8* 10.0  HGB 11.0* 12.4*  HCT 33.2* 37.3*  PLT 218 235    BMET  Recent Labs  09/06/14 0358 09/07/14 0515  NA 136* 140  K 3.1* 3.3*  CL 97 98  CO2 27 27  GLUCOSE 133* 139*  BUN 21 18  CREATININE 0.92 0.94  CALCIUM 8.6 8.8   PT/INR No results for input(s): LABPROT, INR in the last 72 hours.   Recent Labs Lab 09/02/14 1706 09/03/14 0751 09/04/14 0437 09/05/14 0330  AST 39* 32 22 24  ALT 26 26 21 19   ALKPHOS 94 92 87 104  BILITOT 4.0* 4.0* 1.8* 1.2  PROT 7.2 7.0 6.4 6.5  ALBUMIN 2.9* 2.7* 2.4* 2.3*     Lipase     Component Value Date/Time   LIPASE 40 09/03/2014 0751     Studies/Results: No results found.  Medications: . antiseptic oral rinse  7 mL Mouth Rinse q12n4p  . bimatoprost  1 drop Both Eyes QHS  . chlorhexidine  15 mL Mouth Rinse  BID  . doxazosin  4 mg Oral Daily  . metoprolol tartrate  25 mg Oral BID  . piperacillin-tazobactam (ZOSYN)  IV  3.375 g Intravenous Q8H  . rivaroxaban  20 mg Oral Q supper  . sodium chloride  3 mL Intravenous Q12H    Assessment/Plan 1. Acute cholecystitis - Perc drain - 09/04/2014 - Dr. Rolla Plate Levaquin/Zosyn 2. A. Fib 3. Chronic anticoagulation INR - 2.36 on 09/04/2014 On Xarelto 4. HTN 5. OSA 6. Chronic CHF 7. DVT prophylaxis - on hold for anticoagulation 8. Some loose stools - C. Diff neg - 09/05/2014   Plan:  Advance diet, mobilize, I think he is doing well.  We will put info in AVS for follow up and drain care.     LOS: 5 days    Jaelene Garciagarcia 09/07/2014

## 2014-09-07 NOTE — Progress Notes (Signed)
Patient ID: Cameron [name], male   DOB: 1934-01-31, 80 y.o.   MRN: [id number] [facility] PROGRESS NOTE  Cameron [name] DHR:[id number] DOB: [birth year] DOA: 09/02/2014 PCP: Simona Huh, MD  Brief narrative:    80 y.o. male with a PMH of hypertension, OSA, neuropathy, atrial fibrillation on chronic Xarelto and chronic diastolic heart failure who was admitted 09/02/14 with a chief complaint of RUQ pain and reports of confusion. He saw his PCP and was found to have hyperbilirubinemia. A CT scan of the abdomen, done on admission, showed acute cholecystitis. The patient underwent a cholecystostomy on 09/04/14, and became agitated with increased confusion during the procedure and post procedure had pain out of proportion to procedure performed. He also required Bipap post procedure. A CT scan showed appropriate placement of his cholecystostomy tube with no evidence of complications.  Assessment/Plan:     Principal Problem:  Acute cholecystitis with hyperlipasemia and hyperbilirubinemia/cholelithiasis/choledocholithiasis / leukocytosis  - CT findings on admission as noted below. Stone noted in the cystic duct. - S/P surgical consultation /GI consultations. Dr. Cristina Gong did not think he had choledocholithiasis and did not recommend ERCP. S/P U/S which showed a stone in the gallbladder neck, normal bile duct caliber.  - Patient underwent cholecystotomy 09/04/14. Postprocedure CT scan as noted below. Biliary drain in place. Per surgery, patient will have to follow-up in their office in 3 weeks after discharge. Cholecystectomy planned in 6-8 weeks. - Patient is currently on empiric Zosyn. - Culture from gallbladder so far show no growth. Blood cultures to date show no growth.  Active Problems:  Diarrhea - C. Diff. Negative. - Resolved   Abdominal pain - Patient has no complaints of abdominal pain this morning. Fentanyl ordered as needed for pain control.   Hyperglycemia/impaired  fasting glucose - Hemoglobin A1c 6%, corresponding to a mean plasma glucose of 126. The patient is at risk for developing type 2 diabetes. - Dietician consulted.   Acute confusional state - Patient with good mental status this morning. In my discussion with his son he said his father has occasional confusional state although he noticed the mental status is improving. Likely reflective of acute infection. - CT of the head negative on admission.   Chronic atrial fibrillation / chronic anticoagulation - Xarelto held on admission, resumed 09/05/14. - Continue oral metoprolol and IV metoprolol as needed for heart rate greater than 100   Hypertension - Continue Cardura and metoprolol.   Acute on chronic diastolic heart failure / elevated BNP - With decompensation in respiratory status post procedure 09/04/14 and chest x-ray showing worsening pulmonary edema, the patient has been diuresed as needed. - Patient is on Lasix at home, we will resume this today. - Replete electrolytes as needed.   Acute respiratory failure/OSA (obstructive sleep apnea) - Continue CPAP as needed.   Hypokalemia - Secondary to Lasix. Supplemented.   DVT Prophylaxis - Resumed Xarelto.  Code Status: Full. Family Communication: Son, Cameron [name] updated by telephone 09/07/2014  Disposition Plan: needs PT and OT eval.    IV access:   Peripheral IV  Procedures and diagnostic studies:    Dg Chest 2 View 09/02/2014: 1. Mild stable cardiac enlargement. 2. Low lung volumes with vascular crowding and streaky bibasilar atelectasis. 3. No pneumothorax or free intra-abdominal air.   Dg Ribs Unilateral Right 09/02/2014: No RIGHT rib abnormalities are radiographically identified.   Ct Head Wo Contrast 09/02/2014: No acute intracranial process ; normal noncontrast CT of the head for age.   Ct Abdomen  Pelvis W Contrast 09/02/2014: Acute cholecystitis without biliary dilatation. Equivocal stone in the cystic duct  identified. Small amount of free pelvic fluid. Cardiomegaly and mild bibasilar atelectasis.   Dg Abd Acute W/chest 09/02/2014: 1. Stable cardiac enlargement. 2. Low lung volumes with vascular crowding, vascular congestion and bibasilar atelectasis. 3. Possible early or partial small bowel obstruction.   US Abdomen Limited Ruq 09/03/2014: Solitary gallstone is noted in the neck of the gallbladder with sludge present within gallbladder lumen. Mild gallbladder wall thickening is noted and these findings may represent acute cholecystitis. HIDA scan may be performed for further evaluation. Fatty infiltration of the liver is noted. Small amount of free fluid is seen adjacent to the right hepatic lobe.   Ct Abdomen Pelvis W Contrast 09/04/2014: 1. Interval percutaneous cholecystostomy tube placement with the tip of the catheter in the gallbladder neck. The gallbladder is decompressed. There are persistent pericholecystic inflammatory changes. There is new small amount of perihepatic fluid which is low density likely reflecting bile or simple fluid secondary to bile peritonitis. 2. New small right pleural effusion and compressive atelectasis. Trace left pleural effusion and mild atelectasis. No pneumothorax.   Ir Perc Cholecystostomy 09/04/2014: Status post percutaneous cholecystostomy. PLAN: The patient became acutely combative and agitated during the procedure. Hemodynamically, there was no hypotension, though the patient was tachycardic. No drop in the oxygenation. This response may have a then acute bacteremia/sepsis, or alternatively this may be related to pain and paradoxical reaction to the sedation medication. The patient was transported directly to his bed in knee ICU, and communication of the patient's change in mental status was communicated to the ICU team in his primary physician. Consideration may be for bacteremia/sepsis versus paradoxical reaction to sedation. Chest x-ray and correlation  with cardiac markers may be considered.   Dg Chest Port 1 View 09/04/2014: Pulmonary edema, more pronounced than on the previous study.   Dg Abd Portable 1v 09/04/2014: Cholecystostomy tube remains in place. No complicating feature by radiography. Electronically Signed By: Nelson Chimes M.D. On: 09/04/2014 15:16   Dg Chest Port 1 View 09/05/2014: Vascular congestion and mild cardiomegaly. Increased interstitial markings raise concern for persistent pulmonary edema, perhaps slightly improved from the prior study. Lungs hypoexpanded, with mild bilateral atelectasis.   Medical Consultants:   Dr. Donnie Mesa, Surgery  Dr. Ronald Lobo, GI  Dr. Baltazar Apo, PCCM Other Consultants:   Physical therapy   Occupational therapy  IAnti-Infectives:    Vancomycin 09/02/14---> 09/02/14  Zosyn 09/02/14--->   Leisa Lenz, MD  [facility] Pager [phone]  If 7PM-7AM, please contact night-coverage [email or web address] Password [email or web address] 09/07/2014, 10:19 AM   LOS: 5 days    HPI/Subjective: No acute overnight events.  Objective: Filed Vitals:   09/06/14 1154 09/06/14 1400 09/06/14 2059 09/07/14 0617  BP: 141/66 131/68 135/70 144/65  Pulse:  96 94 92  Temp: 99.1 F (37.3 C) 98.4 F (36.9 C) 98.5 F (36.9 C) 97.9 F (36.6 C)  TempSrc:  Oral Oral Oral  Resp:  24 20 18   Height:      Weight:    91.354 kg (201 lb 6.4 oz)  SpO2: 95% 96% 97% 97%    Intake/Output Summary (Last 24 hours) at 09/07/14 1019 Last data filed at 09/07/14 0740  Gross per 24 hour  Intake   2050 ml  Output    950 ml  Net   1100 ml    Exam:   General:  Pt is alert, not in acute distress  Cardiovascular: Regular rate  and rhythm, S1/S2 appreciated   Respiratory: no wheezing, no crackles, no rhonchi  Abdomen: Soft, non tender, non distended, bowel sounds present; biliary drain in place   Extremities: No edema, pulses DP and PT palpable bilaterally  Neuro: Grossly nonfocal  Data  Reviewed: Basic Metabolic Panel:  Recent Labs Lab 09/03/14 0751 09/04/14 0437 09/05/14 0330 09/06/14 0358 09/07/14 0515  NA 135* 137 140 136* 140  K 4.0 3.8 3.0* 3.1* 3.3*  CL 96 100 100 97 98  CO2 22 22 26 27 27   GLUCOSE 135* 153* 136* 133* 139*  BUN 18 32* 29* 21 18  CREATININE 1.17 1.29 1.12 0.92 0.94  CALCIUM 8.9 8.7 9.0 8.6 8.8  MG  --   --   --  2.1 2.0  PHOS  --   --   --  2.1* 2.7   Liver Function Tests:  Recent Labs Lab 09/02/14 1706 09/03/14 0751 09/04/14 0437 09/05/14 0330  AST 39* 32 22 24  ALT 26 26 21 19   ALKPHOS 94 92 87 104  BILITOT 4.0* 4.0* 1.8* 1.2  PROT 7.2 7.0 6.4 6.5  ALBUMIN 2.9* 2.7* 2.4* 2.3*    Recent Labs Lab 09/02/14 1706 09/03/14 0751  LIPASE 145* 40   No results for input(s): AMMONIA in the last 168 hours. CBC:  Recent Labs Lab 09/02/14 1706 09/03/14 0751 09/04/14 0437 09/05/14 0330 09/06/14 0358 09/07/14 0515  WBC 19.2* 21.0* 17.3* 13.3* 10.8* 10.0  NEUTROABS 16.7*  --   --   --   --   --   HGB 12.6* 12.6* 11.7* 11.3* 11.0* 12.4*  HCT 37.5* 36.7* 34.5* 34.2* 33.2* 37.3*  MCV 85.0 85.0 85.6 85.9 85.8 86.5  PLT 149* 147* 172 205 218 235   Cardiac Enzymes:  Recent Labs Lab 09/04/14 1027  TROPONINI <0.30   BNP: Invalid input(s): POCBNP CBG: No results for input(s): GLUCAP in the last 168 hours.  Culture, blood (routine x 2)     Status: None (Preliminary result)   Collection Time: 09/02/14  5:07 PM  Result Value Ref Range Status   Specimen Description BLOOD BLOOD LEFT FOREARM  Final   Special Requests BOTTLES DRAWN AEROBIC AND ANAEROBIC 5 CC  Final           BLOOD CULTURE RECEIVED NO GROWTH TO DATE    Report Status PENDING  Incomplete  Culture, blood (routine x 2)     Status: None (Preliminary result)   Collection Time: 09/02/14  5:07 PM  Result Value Ref Range Status   Specimen Description BLOOD BLOOD RIGHT FOREARM  Final   Special Requests BOTTLES DRAWN AEROBIC AND ANAEROBIC 5 CC  Final           BLOOD  CULTURE RECEIVED NO GROWTH TO DATE   Report Status PENDING  Incomplete  MRSA PCR Screening     Status: Abnormal   Collection Time: 09/03/14 12:27 AM  Result Value Ref Range Status   MRSA by PCR INVALID RESULTS, SPECIMEN SENT FOR CULTURE (A) NEGATIVE Final  MRSA culture     Status: None   Collection Time: 09/03/14 12:27 AM  Result Value Ref Range Status   Specimen Description NASOPHARYNGEAL  Final   Special Requests NONE  Final   Culture   Final    NO STAPHYLOCOCCUS AUREUS ISOLATED   Report Status 09/05/2014 FINAL  Final  Culture, routine-abscess     Status: None   Collection Time: 09/04/14 10:01 AM  Result Value Ref Range Status   Specimen  Description GALL BLADDER  Final   Special Requests Normal  Final   Gram Stain   Final   Culture   Final    NO GROWTH 3 DAYS   Report Status 09/07/2014 FINAL  Final  Anaerobic culture     Status: None (Preliminary result)   Collection Time: 09/04/14 10:01 AM  Result Value Ref Range Status   Specimen Description GALL BLADDER ABSCESS  Final   Special Requests Normal  Final   Gram Stain   Final   Culture   Final    NO ANAEROBES ISOLATED; CULTURE IN PROGRESS FOR 5 DAY   Report Status PENDING  Incomplete  Culture, blood (routine x 2)     Status: None (Preliminary result)   Collection Time: 09/04/14 10:27 AM  Result Value Ref Range Status   Specimen Description BLOOD RIGHT ARM  Final   Special Requests BOTTLES DRAWN AEROBIC AND ANAEROBIC 10CC  Final   Culture  Setup Time   Final   Culture   Final           BLOOD CULTURE RECEIVED NO GROWTH TO DATE    Report Status PENDING  Incomplete  Culture, blood (routine x 2)     Status: None (Preliminary result)   Collection Time: 09/04/14 10:30 AM  Result Value Ref Range Status   Specimen Description BLOOD LEFT HAND  Final   Special Requests BOTTLES DRAWN AEROBIC AND ANAEROBIC [place]  Final   Culture  Setup Time   Final   Culture   Final           BLOOD CULTURE RECEIVED NO GROWTH TO DATE    Report Status  PENDING  Incomplete  Clostridium Difficile by PCR     Status: None   Collection Time: 09/05/14 10:21 AM  Result Value Ref Range Status   C difficile by pcr NEGATIVE NEGATIVE Final    Comment: Performed at [facility]     Scheduled Meds: . doxazosin  4 mg Oral Daily  . metoprolol tartrate  25 mg Oral BID  . piperacillin-tazobactam (ZOSYN)  IV  3.375 g Intravenous Q8H  . potassium chloride  40 mEq Oral Once  . rivaroxaban  20 mg Oral Q supper

## 2014-09-08 ENCOUNTER — Telehealth: Payer: Self-pay | Admitting: Cardiology

## 2014-09-08 DIAGNOSIS — R0602 Shortness of breath: Secondary | ICD-10-CM

## 2014-09-08 LAB — BASIC METABOLIC PANEL
Anion gap: 11 (ref 5–15)
BUN: 13 mg/dL (ref 6–23)
CO2: 27 mEq/L (ref 19–32)
Calcium: 8.2 mg/dL — ABNORMAL LOW (ref 8.4–10.5)
Chloride: 94 mEq/L — ABNORMAL LOW (ref 96–112)
Creatinine, Ser: 1.02 mg/dL (ref 0.50–1.35)
GFR, EST AFRICAN AMERICAN: 79 mL/min — AB (ref >90)
GFR, EST NON AFRICAN AMERICAN: 68 mL/min — AB (ref >90)
Glucose, Bld: 122 mg/dL — ABNORMAL HIGH (ref 70–99)
POTASSIUM: 3.4 meq/L — AB (ref 3.7–5.3)
Sodium: 132 mEq/L — ABNORMAL LOW (ref 137–147)

## 2014-09-08 LAB — CBC
HCT: 34.3 % — ABNORMAL LOW (ref 39.0–52.0)
Hemoglobin: 11.2 g/dL — ABNORMAL LOW (ref 13.0–17.0)
MCH: 27.9 pg (ref 26.0–34.0)
MCHC: 32.7 g/dL (ref 30.0–36.0)
MCV: 85.5 fL (ref 78.0–100.0)
PLATELETS: 240 10*3/uL (ref 150–400)
RBC: 4.01 MIL/uL — ABNORMAL LOW (ref 4.22–5.81)
RDW: 14.6 % (ref 11.5–15.5)
WBC: 8.1 10*3/uL (ref 4.0–10.5)

## 2014-09-08 LAB — CULTURE, BLOOD (ROUTINE X 2)
Culture: NO GROWTH
Culture: NO GROWTH

## 2014-09-08 MED ORDER — FUROSEMIDE 20 MG PO TABS
20.0000 mg | ORAL_TABLET | Freq: Every day | ORAL | Status: DC
  Administered 2014-09-08: 20 mg via ORAL
  Filled 2014-09-08: qty 1

## 2014-09-08 MED ORDER — CETYLPYRIDINIUM CHLORIDE 0.05 % MT LIQD: 7.0000 mL | Freq: Two times a day (BID) | OROMUCOSAL | Status: DC

## 2014-09-08 MED ORDER — AMOXICILLIN-POT CLAVULANATE 875-125 MG PO TABS: 1.0000 | ORAL_TABLET | Freq: Two times a day (BID) | ORAL | Status: DC

## 2014-09-08 MED ORDER — POTASSIUM CHLORIDE ER 10 MEQ PO TBCR: 10.0000 meq | EXTENDED_RELEASE_TABLET | Freq: Every day | ORAL | Status: DC

## 2014-09-08 MED ORDER — ACETAMINOPHEN 325 MG PO TABS: 650.0000 mg | ORAL_TABLET | Freq: Four times a day (QID) | ORAL | Status: DC | PRN

## 2014-09-08 MED ORDER — FUROSEMIDE 40 MG PO TABS: ORAL_TABLET | ORAL | Status: DC

## 2014-09-08 NOTE — Plan of Care (Signed)
Problem: Discharge Progression Outcomes Goal: Discharge plan in place and appropriate Outcome: Completed/Met Date Met:  09/08/14 Goal: Pain controlled with appropriate interventions Outcome: Completed/Met Date Met:  09/08/14 Goal: Hemodynamically stable Outcome: Completed/Met Date Met:  09/08/14 Goal: Complications resolved/controlled Outcome: Completed/Met Date Met:  09/08/14 Goal: Tolerating diet Outcome: Completed/Met Date Met:  09/08/14 Goal: Activity appropriate for discharge plan Outcome: Completed/Met Date Met:  09/08/14 Goal: Other Discharge Outcomes/Goals Outcome: Completed/Met Date Met:  09/08/14     

## 2014-09-08 NOTE — Discharge Instructions (Signed)
Cholecystitis Cholecystitis is an inflammation of your gallbladder. It is usually caused by a buildup of gallstones or sludge (cholelithiasis) in your gallbladder. The gallbladder stores a fluid that helps digest fats (bile). Cholecystitis is serious and needs treatment right away.  CAUSES   Gallstones. Gallstones can block the tube that leads to your gallbladder, causing bile to build up. As bile builds up, the gallbladder becomes inflamed.  Bile duct problems, such as blockage from scarring or kinking.  Tumors. Tumors can stop bile from leaving your gallbladder correctly, causing bile to build up. As bile builds up, the gallbladder becomes inflamed. SYMPTOMS   Nausea.  Vomiting.  Abdominal pain, especially in the upper right area of your abdomen.  Abdominal tenderness or bloating.  Sweating.  Chills.  Fever.  Yellowing of the skin and the whites of the eyes (jaundice). DIAGNOSIS  Your caregiver may order blood tests to look for infection or gallbladder problems. Your caregiver may also order imaging tests, such as an ultrasound or computed tomography (CT) scan. Further tests may include a hepatobiliary iminodiacetic acid (HIDA) scan. This scan allows your caregiver to see your bile move from the liver to the gallbladder and to the small intestine. TREATMENT  A hospital stay is usually necessary to lessen the inflammation of your gallbladder. You may be required to not eat or drink (fast) for a certain amount of time. You may be given medicine to treat pain or an antibiotic medicine to treat an infection. Surgery may be needed to remove your gallbladder (cholecystectomy) once the inflammation has gone down. Surgery may be needed right away if you develop complications such as death of gallbladder tissue (gangrene) or a tear (perforation) of the gallbladder.  [place] care will depend on your treatment. In general:  If you were given antibiotics, take them as  directed. Finish them even if you start to feel better.  Only take over-the-counter or prescription medicines for pain, discomfort, or fever as directed by your caregiver.  Follow a low-fat diet until you see your caregiver again.  Keep all follow-up visits as directed by your caregiver. SEEK IMMEDIATE MEDICAL CARE IF:   Your pain is increasing and not controlled by medicines.  Your pain moves to another part of your abdomen or to your back.  You have a fever.  You have nausea and vomiting. MAKE SURE YOU:  Understand these instructions.  Will watch your condition.  Will get help right away if you are not doing well or get worse. Document Released: 09/18/2005 Document Revised: 12/11/2011 Document Reviewed: 08/04/2011 [facility] Patient Information 2015 [place], [place]. This information is not intended to replace advice given to you by your health care provider. Make sure you discuss any questions you have with your health care provider. Cholecystostomy  The gallbladder is a pear-shaped organ that lies beneath the liver on the right side of the body. The gallbladder stores bile, a fluid that helps the body digest fats. However, sometimes bile and other fluids build up in the gallbladder because of an obstruction (for example, a gallstones). This can cause fever, pain, swelling, nausea and other serious symptoms. The procedure used to drain these fluids is called a cholecystostomy. A tube is inserted into the gallbladder. Fluid drains through the tube into a plastic bag outside the body. This procedure is usually done on people who are admitted to the hospital. The procedure is often recommended for people who cannot have gallbladder surgery right away, usually because they are  too ill to make it through surgery. The cholecystostomy tube is usually temporary, until surgery can be done. RISKS AND COMPLICATIONS Although rare, complications can include:  Clogging of the tube.  Infection in  or around the drain site. Antibiotics might be prescribed for the infection. Or, another tube might be inserted to drain the infected fluid.  Internal bleeding from the liver. BEFORE THE PROCEDURE   Try to quit smoking several weeks before the procedure. Smoking can slow healing.  Arrange for someone to drive you home from the hospital.  Right before your procedure, avoid all foods and liquids after midnight. This includes coffee, tea and water.  On the day of the procedure, arrive early to fill out all the paperwork. PROCEDURE You will be given a sedative to make you sleepy and a local anesthetic to numb the skin. Next, a small cut is made in the abdomen. Then a tube is threaded through the cut into the gallbladder. The procedure is usually done with ultrasound to guide the tube into the gallbladder. Once the tube is in place, the drain is secured to the skin with a stitch. The tube is then connected to a drainage bag.  AFTER THE PROCEDURE   People who have a cholecystostomy usually stay in the hospital for several days because they are so ill. You might not be able to eat for the first few days. Instead, you will be connected to an IV for fluids and nutrients.  The procedure does not cure the blockage that caused the fluid to build up in the first place. Because of this, the gallbladder will need to be removed in the future. The drain is removed at that time. HOME CARE INSTRUCTIONS  Be sure to follow your healthcare provider's instructions carefully. You may shower but avoid tub baths and swimming until your caregiver says it is OK. Eat and drink according to the directions you have been given. And be sure to make all follow-up appointments.  Call your healthcare provider if you notice new pain, redness or swelling around the wound. SEEK IMMEDIATE MEDICAL CARE IF:   There is increased abdominal pain.  Nausea or vomiting occurs.  You develop a fever.  The drainage tube comes out of the  abdomen. Document Released: 12/15/2008 Document Revised: 12/11/2011 Document Reviewed: 12/15/2008 [facility] Patient Information 2015 [place], [place]. This information is not intended to replace advice given to you by your health care provider. Make sure you discuss any questions you have with your health care provider.  Biliary Drainage Catheter Placement, Care After Refer to this sheet in the next few weeks. These instructions provide you with information on caring for yourself after your procedure. Your health care provider may also give you more specific instructions. Your treatment has been planned according to current medical practices, but problems sometimes occur. Call your health care provider if you have any problems or questions after your procedure. WHAT TO EXPECT AFTER THE PROCEDURE After your procedure, it is typical to have the following:  Pain or soreness at the catheter insertion site.  Drowsiness for several hours after the procedure.  Some bruising at the catheter insertion site.  Drainage into the collection bag on the outside of your body, if you have an external drainage catheter. You might see bloody discharge in the bag for the first day or two. This should turn a yellow-green color soon afterward. HOME CARE INSTRUCTIONS   Do not use machinery, drive, or make legal decisions for 24 hours after  your procedure.  Have someone drive you home.  Resume your usual diet. Avoid alcoholic beverages for 24 hours after your procedure.  Rest for the remainder of the day.  Only take over-the-counter or prescription medicines for pain, discomfort, or fever as directed by your health care provider. Do not take aspirin or blood thinners unless directed otherwise. This can make bleeding worse.  Clean the tube insertion site as directed by your health care provider.  Take showers, not baths. Avoid pools and hot tubs. Before showering, cover the area with plastic wrap and tape the  edges of the plastic wrap to your skin. This is done to keep your skin dry.  Keep the skin around the insertion site dry. If the area gets wet, dry the skin completely.  Keep all follow-up appointments. SEEK MEDICAL CARE IF:   Your pain gets worse and is not relieved with pain medicines after an initial improvement.  You have any questions about your tube.  Your skin breaks down around the tube.  You have a fever.  You have chills. SEEK IMMEDIATE MEDICAL CARE IF:   Your redness, soreness, or swelling at the tube insertion site gets worse despite good cleaning.  You have leakage of bile around the tube.  Your tube becomes blocked or clogged.  Your catheter is dislodged or comes out. Document Released: 05/02/2004 Document Revised: 09/23/2013 Document Reviewed: 05/26/2013 [facility] Patient Information 2015 [place], [place]. This information is not intended to replace advice given to you by your health care provider. Make sure you discuss any questions you have with your health care provider.

## 2014-09-08 NOTE — Progress Notes (Signed)
CARE MANAGEMENT NOTE 09/08/2014  Patient:  Cameron [name], Cameron [name]   Account Number:  [id number]  Date Initiated:  09/03/2014  Documentation initiated by:  DAVIS,RHONDA  Subjective/Objective Assessment:   pt with urosepsis and ams recent gi surgerical procedure     Action/Plan:   home when stable   Anticipated DC Date:  09/06/2014   Anticipated DC Plan:  [place] referral  NA      DC Planning Services  CM consult      [facility] Choice  NA   Choice offered to / List presented to:  C-1 Patient   DME arranged  NA      DME agency  NA     [place] arranged  HH-1 RN      [place].   Status of service:  Completed, signed off Medicare Important Message given?  YES (If response is "NO", the following Medicare IM given date fields will be blank) Date Medicare IM given:  09/07/2014 Medicare IM given by:  Edwyna Shell Date Additional Medicare IM given:   Additional Medicare IM given by:    Discharge Disposition:  [place]  Per UR Regulation:  Reviewed for med. necessity/level of care/duration of stay  If discussed at [place] of Stay Meetings, dates discussed:    Comments:  09/08/14 Edwyna Shell RN BSN CM [phone] Patient chose [facility] since he has his DME through them and is familiar. Referral communicated to [facility], [place]. Patient will initially go home with son Cameron [name] [phone].  09/07/14 Edwyna Shell RN BSN CM [phone] transferred from ICU to [place] on 12/6. Patient stated that he lives home alone with support of his son. He stated that he does not feel that he needs [facility] RN for discharge follow up. Will continue to follow for any discharge needs  [id number]/Rhonda Davis,RN,BSN,CCM

## 2014-09-08 NOTE — Telephone Encounter (Signed)
Note sent to schedule patient for PFT.   Unable to schedule at this time due to the fact that patient has been admitted to the hospital on 12-2 and is still in the hospital.

## 2014-09-08 NOTE — Telephone Encounter (Signed)
He was discharged today - try to call him at home this afternoon

## 2014-09-08 NOTE — Discharge Summary (Signed)
Physician Discharge Summary  Cameron [name] SEG:[id number] DOB: 10/28/1933 DOA: 09/02/2014  PCP: Simona Huh, MD  Admit date: 09/02/2014 Discharge date: 09/08/2014  Recommendations for Outpatient Follow-up:  1. Please continue taking Lasix 40 mg daily. Is there is more swelling in legs you may take half a tablet extra, 20 mg. Usually signs of worsening heart failure may present with more leg swelling, shortness of breath or weight gain.  2. Please take Augmentin for 10 days on discharge. This was confirmed with infectious disease specialist prior to discharge, Dr. Michel Bickers.  Discharge Diagnoses:  Principal Problem:   Acute cholecystitis Active Problems:   Chronic atrial fibrillation   Hypertension   Chronic anticoagulation   Chronic diastolic heart failure   OSA (obstructive sleep apnea)   Elevated lipase   Elevated brain natriuretic peptide (BNP) level   Elevated bilirubin   Hyperglycemia   Acute confusional state   Cholelithiasis   Choledocholithiasis   IFG (impaired fasting glucose)   Acute respiratory failure   Acute on chronic diastolic CHF (congestive heart failure)   Hypokalemia   Abdominal pain   Acute respiratory distress    Discharge Condition: stable   Diet recommendation: as tolerated   History of present illness:  80 y.o. male with a PMH of hypertension, OSA, neuropathy, atrial fibrillation on chronic Xarelto and chronic diastolic heart failure who was admitted 09/02/14 with a chief complaint of RUQ pain and reports of confusion. He saw his PCP and was found to have hyperbilirubinemia. A CT scan of the abdomen, done on admission, showed acute cholecystitis. The patient underwent a cholecystostomy on 09/04/14, and became agitated with increased confusion during the procedure and post procedure had pain out of proportion to procedure performed. He also required Bipap post procedure. A CT scan showed appropriate placement of his cholecystostomy tube with no  evidence of complications.  Assessment/Plan:     Principal Problem:  Acute cholecystitis with hyperlipasemia and hyperbilirubinemia/cholelithiasis/choledocholithiasis / leukocytosis  - CT findings on admission as noted below. Stone noted in the cystic duct. - S/P surgical consultation /GI consultations. Dr. Cristina Gong did not think he had choledocholithiasis and did not recommend ERCP. S/P U/S which showed a stone in the gallbladder neck, normal bile duct caliber.  - Patient underwent cholecystotomy 09/04/14. Postprocedure CT scan as noted below. Biliary drain in place. Per surgery, patient will have to follow-up in their office in 3 weeks after discharge. Cholecystectomy planned in 6-8 weeks. - Patient is currently on empiric Zosyn. Spoke with infectious disease, Dr. Michel Bickers who recommended Augmentin on discharge. We will prescribe 10 days of Augmentin. Patient does not need to be on Augmentin for the duration of the drain per infectious disease. - Culture from gallbladder so far show no growth. Blood cultures to date show no growth.  Active Problems:  Diarrhea - C. Diff. Negative. - Resolved   Abdominal pain - Patient has no complaints of abdominal pain this morning. Fentanyl ordered as needed for pain control. - On discharge, patient will take Ultram which was prescribed before. Prescription for pain medications were not provided at the time of the discharge.   Hyperglycemia/impaired fasting glucose - Hemoglobin A1c 6%, corresponding to a mean plasma glucose of 126. The patient is at risk for developing type 2 diabetes. - Dietician consulted. - Discussed diet regimen with the patient.   Acute confusional state - Patient with good mental status this morning. In my discussion with his son he said his father has occasional confusional state although  he noticed the mental status is improving. Likely reflective of acute infection. - CT of the head negative on admission.    Chronic atrial fibrillation / chronic anticoagulation - Xarelto held on admission, resumed 09/05/14. - Continue metoprolol per prior home regimen.   Hypertension - Continue Cardura and metoprolol.   Acute on chronic diastolic heart failure / elevated BNP - With decompensation in respiratory status post procedure 09/04/14 and chest x-ray showing worsening pulmonary edema, the patient has been diuresed as needed. - Patient is on Lasix at home. Can continue taking lasix per prior home regimen. Added potassium supplementation.   Acute respiratory failure/OSA (obstructive sleep apnea) - Continue CPAP as needed.   Hypokalemia - Secondary to Lasix. Supplemented.   DVT Prophylaxis - Resumed Xarelto.  Code Status: Full. Family Communication: Son, Bladimir Auman updated by telephone 09/08/2014     IV access:   Peripheral IV  Procedures and diagnostic studies:   Dg Chest 2 View 09/02/2014: 1. Mild stable cardiac enlargement. 2. Low lung volumes with vascular crowding and streaky bibasilar atelectasis. 3. No pneumothorax or free intra-abdominal air.   Dg Ribs Unilateral Right 09/02/2014: No RIGHT rib abnormalities are radiographically identified.   Ct Head Wo Contrast 09/02/2014: No acute intracranial process ; normal noncontrast CT of the head for age.   Ct Abdomen Pelvis W Contrast 09/02/2014: Acute cholecystitis without biliary dilatation. Equivocal stone in the cystic duct identified. Small amount of free pelvic fluid. Cardiomegaly and mild bibasilar atelectasis.   Dg Abd Acute W/chest 09/02/2014: 1. Stable cardiac enlargement. 2. Low lung volumes with vascular crowding, vascular congestion and bibasilar atelectasis. 3. Possible early or partial small bowel obstruction.   US Abdomen Limited Ruq 09/03/2014: Solitary gallstone is noted in the neck of the gallbladder with sludge present within gallbladder lumen. Mild gallbladder wall thickening is noted and these findings may  represent acute cholecystitis. HIDA scan may be performed for further evaluation. Fatty infiltration of the liver is noted. Small amount of free fluid is seen adjacent to the right hepatic lobe.   Ct Abdomen Pelvis W Contrast 09/04/2014: 1. Interval percutaneous cholecystostomy tube placement with the tip of the catheter in the gallbladder neck. The gallbladder is decompressed. There are persistent pericholecystic inflammatory changes. There is new small amount of perihepatic fluid which is low density likely reflecting bile or simple fluid secondary to bile peritonitis. 2. New small right pleural effusion and compressive atelectasis. Trace left pleural effusion and mild atelectasis. No pneumothorax.   Ir Perc Cholecystostomy 09/04/2014: Status post percutaneous cholecystostomy. PLAN: The patient became acutely combative and agitated during the procedure. Hemodynamically, there was no hypotension, though the patient was tachycardic. No drop in the oxygenation. This response may have a then acute bacteremia/sepsis, or alternatively this may be related to pain and paradoxical reaction to the sedation medication. The patient was transported directly to his bed in knee ICU, and communication of the patient's change in mental status was communicated to the ICU team in his primary physician. Consideration may be for bacteremia/sepsis versus paradoxical reaction to sedation. Chest x-ray and correlation with cardiac markers may be considered.   Dg Chest Port 1 View 09/04/2014: Pulmonary edema, more pronounced than on the previous study.   Dg Abd Portable 1v 09/04/2014: Cholecystostomy tube remains in place. No complicating feature by radiography. Electronically Signed By: Nelson Chimes M.D. On: 09/04/2014 15:16   Dg Chest Port 1 View 09/05/2014: Vascular congestion and mild cardiomegaly. Increased interstitial markings raise concern for persistent pulmonary edema, perhaps slightly  improved from the  prior study. Lungs hypoexpanded, with mild bilateral atelectasis.   Medical Consultants:   Dr. Donnie Mesa, Surgery  Dr. Ronald Lobo, GI  Dr. Baltazar Apo, PCCM Other Consultants:   Physical therapy   Occupational therapy  IAnti-Infectives:    Vancomycin 09/02/14---> 09/02/14  Zosyn 09/02/14---> 09/08/2014   Signed:  Leisa Lenz, MD  [facility] 09/08/2014, 11:01 AM  Pager #: [phone]   Discharge Exam: Filed Vitals:   09/08/14 0450  BP: 116/64  Pulse: 84  Temp: 98.2 F (36.8 C)  Resp: 20   Filed Vitals:   09/07/14 1443 09/07/14 2136 09/08/14 0450 09/08/14 0456  BP: 149/74 131/60 116/64   Pulse: 88 88 84   Temp: 98.1 F (36.7 C) 98.5 F (36.9 C) 98.2 F (36.8 C)   TempSrc: Oral Oral Oral   Resp: 20 18 20    Height:      Weight:    89.268 kg (196 lb 12.8 oz)  SpO2: 98% 97% 97%     General: Pt is alert, follows commands appropriately, not in acute distress Cardiovascular: Regular rate and rhythm, S1/S2 +, no murmurs Respiratory: Clear to auscultation bilaterally, no wheezing, no crackles, no rhonchi Abdominal: Soft, non tender, non distended, bowel sounds +, no guarding; drain in place  Extremities: no edema, no cyanosis, pulses palpable bilaterally DP and PT Neuro: Grossly nonfocal  Discharge Instructions  Discharge Instructions    Call MD for:  difficulty breathing, headache or visual disturbances    Complete by:  As directed      Call MD for:  persistant dizziness or light-headedness    Complete by:  As directed      Call MD for:  persistant nausea and vomiting    Complete by:  As directed      Call MD for:  severe uncontrolled pain    Complete by:  As directed      Diet - low sodium heart healthy    Complete by:  As directed      Discharge instructions    Complete by:  As directed   1. Please continue taking Lasix 40 mg daily. Is there is more swelling in legs you may take half a tablet extra, 20 mg. Usually signs of  worsening heart failure may present with more leg swelling, shortness of breath or weight gain.  2. Please take Augmentin for 10 days on discharge. This was confirmed with infectious disease specialist prior to discharge, Dr. Michel Bickers.     Increase activity slowly    Complete by:  As directed             Medication List    STOP taking these medications        meloxicam 15 MG tablet  Commonly known as:  MOBIC      TAKE these medications        acetaminophen 325 MG tablet  Commonly known as:  TYLENOL  Take 2 tablets (650 mg total) by mouth every 6 (six) hours as needed for mild pain (or Fever >/= 101).     amoxicillin-clavulanate 875-125 MG per tablet  Commonly known as:  AUGMENTIN  Take 1 tablet by mouth 2 (two) times daily.     antiseptic oral rinse 0.05 % Liqd solution  Commonly known as:  CPC / CETYLPYRIDINIUM CHLORIDE 0.05%  7 mLs by Mouth Rinse route 2 times daily at 12 noon and 4 pm.     bimatoprost 0.01 % Soln  Commonly known as:  LUMIGAN  Place 1 drop into both eyes at bedtime.     bisacodyl 5 MG EC tablet  Commonly known as:  DULCOLAX  Take 5 mg by mouth. Take as directed.     cyanocobalamin 1000 MCG/ML injection  Commonly known as:  (VITAMIN B-12)  Inject 1,000 mcg into the muscle every 30 (thirty) days.     doxazosin 4 MG tablet  Commonly known as:  CARDURA  TAKE 1 TABLET BY MOUTH EVERY DAY     fish oil-omega-3 fatty acids 1000 MG capsule  Take 1 g by mouth 2 (two) times daily.     folic acid 1 MG tablet  Commonly known as:  FOLVITE  Take 1 mg by mouth daily.     furosemide 40 MG tablet  Commonly known as:  LASIX  TAKE 1 TABLET BY MOUTH EVERY DAY AND EXTRA if more swelling noted  In lower extremities     metoprolol succinate 25 MG 24 hr tablet  Commonly known as:  TOPROL-XL  Take 25 mg by mouth every morning.     potassium chloride 10 MEQ tablet  Commonly known as:  K-DUR  Take 1 tablet (10 mEq total) by mouth daily.     rivaroxaban 20  MG Tabs tablet  Commonly known as:  XARELTO  Take 20 mg by mouth daily with supper.     traMADol 50 MG tablet  Commonly known as:  ULTRAM  Take 50 mg by mouth every 4 (four) hours as needed for moderate pain or severe pain. Maximum dose= 8 tablets per day           Follow-up Information    Follow up with Earnstine Regal, MD. Schedule an appointment as soon as possible for a visit in 3 weeks.   Specialty:  General Surgery   Why:  Call for follow up appointment in 3 weeks.   Contact information:   [address] [place] [address] [phone]       Follow up with Simona Huh, MD. Schedule an appointment as soon as possible for a visit in 2 weeks.   Specialty:  Family Medicine   Why:  Follow up appt after recent hospitalization   Contact information:   [address], [place] [address] [phone]        The results of significant diagnostics from this hospitalization (including imaging, microbiology, ancillary and laboratory) are listed below for reference.    Significant Diagnostic Studies: Dg Chest 2 View  09/02/2014   CLINICAL DATA:  Shortness of breath 1 day after colonoscopy. Right anterior chest pain and right lower rib pain.  EXAM: CHEST  2 VIEW  COMPARISON:  11/15/2013  FINDINGS: The heart is enlarged but stable. There is tortuosity, ectasia and calcification of the thoracic aorta. There are chronic bronchitic lung changes but no acute pulmonary findings. Low lung volumes with vascular crowding and streaky basilar atelectasis. No pleural effusion or pneumothorax. No free air is identified under the hemidiaphragms. The bony thorax is intact.  IMPRESSION: 1. Mild stable cardiac enlargement. 2. Low lung volumes with vascular crowding and streaky bibasilar atelectasis. 3. No pneumothorax or free intra-abdominal air.   Electronically Signed   By: Kalman Jewels M.D.   On: 09/02/2014 12:09   Dg Ribs Unilateral Right  09/02/2014   CLINICAL  DATA:  Shortness of breath for 1 day, RIGHT anterior chest and RIGHT lower rib pain post colonoscopy  EXAM: RIGHT RIBS - 2 VIEW  COMPARISON:  11/15/2013 chest radiograph  FINDINGS: BB placed at site of symptoms at lower lateral RIGHT chest.  Osseous mineralization normal.  No rib fracture or bone destruction.  No focal osseous abnormalities identified at site of clinical concern.  Scattered endplate spur formation thoracic spine.  IMPRESSION: No RIGHT rib abnormalities are radiographically identified.   Electronically Signed   By: Lavonia Dana M.D.   On: 09/02/2014 12:05   Ct Head Wo Contrast  09/02/2014   CLINICAL DATA:  Altered mental status after colonoscopy 1 day ago, possibly related to anesthesia, now worsening.  EXAM: CT HEAD WITHOUT CONTRAST  TECHNIQUE: Contiguous axial images were obtained from the base of the skull through the vertex without intravenous contrast.  COMPARISON:  CT of the head June 16, 2009  FINDINGS: The ventricles and sulci are normal for age. No intraparenchymal hemorrhage, mass effect nor midline shift. Patchy supratentorial white matter hypodensities are less than expected for patient's age and though non-specific suggest sequelae of chronic small vessel ischemic disease. No acute large vascular territory infarcts.  No abnormal extra-axial fluid collections. Basal cisterns are patent. Mild calcific atherosclerosis of the carotid siphons.  No skull fracture. The included ocular globes and orbital contents are non-suspicious. Status post bilateral ocular lens implants. The mastoid aircells and included paranasal sinuses are well-aerated. Patient is edentulous. Mild temporomandibular osteoarthrosis.  IMPRESSION: No acute intracranial process ; normal noncontrast CT of the head for age.   Electronically Signed   By: Elon Alas   On: 09/02/2014 21:00   Ct Abdomen Pelvis W Contrast  09/04/2014   CLINICAL DATA:  Percutaneous cholecystostomy tube placed earlier today 09/04/2014  draining large amounts of dark bile. Right side abdominal pain and shoulder pain.  EXAM: CT ABDOMEN AND PELVIS WITH CONTRAST  TECHNIQUE: Multidetector CT imaging of the abdomen and pelvis was performed using the standard protocol following bolus administration of intravenous contrast.  CONTRAST:  67mL OMNIPAQUE IOHEXOL 300 MG/ML  SOLN  COMPARISON:  09/02/2014  FINDINGS: There is a new small right pleural effusion with associated compressive atelectasis. There is a trace left pleural effusion with associated atelectasis. There is no pneumothorax.  There is a 6.4 x 4.3 cm hypodense, fluid attenuating left hepatic mass consistent with a cyst. There is no intrahepatic or extrahepatic biliary ductal dilatation.  There is interval percutaneous cholecystostomy with a pigtail catheter coiled in the neck of the gallbladder. There is a small amount of perihepatic low density fluid which is new compared with the prior examination likely reflecting a small amount of bile and possibly resulting in Mild bile peritonitis. There are pericholecystic inflammatory changes again noted.  The spleen demonstrates no focal abnormality. The kidneys, adrenal glands and pancreas are normal. The bladder is unremarkable.  The stomach, duodenum, small intestine, and large intestine demonstrate no bowel wall thickening or dilatation. There is no pneumoperitoneum, pneumatosis, or portal venous gas. There is no lymphadenopathy.  The abdominal aorta is normal in caliber with atherosclerosis.  There are no lytic or sclerotic osseous lesions. There is lumbar spine spondylosis.  IMPRESSION: 1. Interval percutaneous cholecystostomy tube placement with the tip of the catheter in the gallbladder neck. The gallbladder is decompressed. There are persistent pericholecystic inflammatory changes. There is new small amount of perihepatic fluid which is low density likely reflecting bile or simple fluid secondary to bile peritonitis. 2. New small right pleural  effusion and compressive atelectasis. Trace left pleural effusion and mild atelectasis. No pneumothorax.   Electronically Signed   By:  Kathreen Devoid   On: 09/04/2014 16:38   Ct Abdomen Pelvis W Contrast  09/02/2014   CLINICAL DATA:  79 year old male with right abdominal and pelvic pain following colonoscopy yesterday. History of right hemicolectomy for polyp. Initial encounter.  EXAM: CT ABDOMEN AND PELVIS WITH CONTRAST  TECHNIQUE: Multidetector CT imaging of the abdomen and pelvis was performed using the standard protocol following bolus administration of intravenous contrast.  CONTRAST:  172mL OMNIPAQUE IOHEXOL 300 MG/ML  SOLN  COMPARISON:  None.  FINDINGS: Mild bibasilar atelectasis and cardiomegaly identified.  The gallbladder is distended with wall thickening and adjacent inflammation compatible with acute cholecystitis. A possible stone in the cystic duct is identified.  The liver, spleen, adrenal glands, kidneys, and pancreas are unremarkable.  There is no evidence of enlarged lymph nodes, biliary dilatation or abdominal aortic aneurysm.  A small amount of free fluid in the pelvis is present.  Evidence of right hemicolectomy noted. The bowel is otherwise unremarkable. There is no evidence of bowel obstruction, abscess or pneumoperitoneum.  The bladder is unremarkable.  No acute or suspicious bony abnormalities are identified.  Degenerative changes in the lumbar spine again identified.  IMPRESSION: Acute cholecystitis without biliary dilatation. Equivocal stone in the cystic duct identified.  Small amount of free pelvic fluid.  Cardiomegaly and mild bibasilar atelectasis.   Electronically Signed   By: Hassan Rowan M.D.   On: 09/02/2014 21:04   Ir Perc Cholecystostomy  09/04/2014   INDICATION: Acute cholecystitis.  The patient has been been prescribed is a relative 0, with the last dose 09/02/2014. The family and the patient understand increased bleeding risk, given that there has been only 48 hours off the  medication. The benefit of doing the procedure on this patient hospitalized in the ICU outweighs the risk of the potential bleed.  EXAM: ULTRASOUND AND FLUOROSCOPIC-GUIDED CHOLECYSTOSTOMY TUBE PLACEMENT  COMPARISON:  None.  MEDICATIONS: Fentanyl 100 mcg IV; Versed 2 mg IV;  250 mg Levaquin IV  Procedural  ANESTHESIA/SEDATION: Total Moderate Sedation Time  Seventeen minutes  CONTRAST:  86mL OMNIPAQUE IOHEXOL 300 MG/ML  SOLN  FLUOROSCOPY TIME:  0 minutes minutes 48 seconds  COMPLICATIONS: None  PROCEDURE: Informed written consent was obtained from the patient after a discussion of the risks, benefits and alternatives to treatment. Questions regarding the procedure were encouraged and answered. A timeout was performed prior to the initiation of the procedure.  The right upper abdominal quadrant was prepped and draped in the usual sterile fashion, and a sterile drape was applied covering the operative field. Maximum barrier sterile technique with sterile gowns and gloves were used for the procedure. A timeout was performed prior to the initiation of the procedure. Local anesthesia was provided with 1% lidocaine with epinephrine.  Ultrasound scanning of the right upper quadrant demonstrates a markedly dilated gallbladder. Of note, the patient reported pain with ultrasound imaging over the gallbladder. Utilizing a transhepatic approach, a 22 gauge needle was advanced into the gallbladder under direct ultrasound guidance. An ultrasound image was saved for documentation purposes. Appropriate intraluminal puncture was confirmed with the efflux of bile and advancement of an 0.018 wire into the gallbladder lumen. The needle was exchanged for an Niobrara set. A small amount of contrast was injected to confirm appropriate intraluminal positioning. Over a Benson wire, a 72.2-French Cook cholecystomy tube was advanced into the gallbladder fossa, coiled and locked. Bile was aspirated and a small amount of contrast was injected as  several post procedural spot radiographic images were obtained in  various obliquities. The catheter was secured to the skin with suture, connected to a drainage bag and a dressing was placed. The patient tolerated the procedure well without immediate post procedural complication.  IMPRESSION: Status post percutaneous cholecystostomy.  PLAN: The patient became acutely combative and agitated during the procedure. Hemodynamically, there was no hypotension, though the patient was tachycardic. No drop in the oxygenation. This response may have a then acute bacteremia/sepsis, or alternatively this may be related to pain and paradoxical reaction to the sedation medication.  The patient was transported directly to his bed in knee ICU, and communication of the patient's change in mental status was communicated to the ICU team in his primary physician.  Consideration may be for bacteremia/sepsis versus paradoxical reaction to sedation.  Chest x-ray and correlation with cardiac markers may be considered.  Signed,  Dulcy Fanny. Earleen Newport, DO  [facility]  [facility]   Electronically Signed   By: Corrie Mckusick D.O.   On: 09/04/2014 11:17   Dg Chest Port 1 View  09/05/2014   CLINICAL DATA:  Acute onset of respiratory distress. Subsequent encounter.  EXAM: PORTABLE CHEST - 1 VIEW  COMPARISON:  Chest radiograph performed 09/04/2014  FINDINGS: The lungs are hypoexpanded. Vascular congestion is noted. Increased interstitial markings raise concern for persistent pulmonary edema, perhaps slightly improved from the prior study. Mild bilateral atelectasis is also seen. No definite pleural effusion or pneumothorax is identified.  The cardiomediastinal silhouette is mildly enlarged. No acute osseous abnormalities are seen.  IMPRESSION: Vascular congestion and mild cardiomegaly. Increased interstitial markings raise concern for persistent pulmonary edema, perhaps slightly improved from the prior  study. Lungs hypoexpanded, with mild bilateral atelectasis.   Electronically Signed   By: Garald Balding M.D.   On: 09/05/2014 05:56   Dg Chest Port 1 View  09/04/2014   CLINICAL DATA:  Acute respiratory distress. Personal history of hypertension. Shortness of breath.  EXAM: PORTABLE CHEST - 1 VIEW  COMPARISON:  09/02/2014  FINDINGS: The heart is enlarged. There is interstitial and early alveolar edema. The may be a small amount of pleural fluid. No consolidation or lobar collapse.  IMPRESSION: Pulmonary edema, more pronounced than on the previous study.   Electronically Signed   By: Nelson Chimes M.D.   On: 09/04/2014 12:19   Dg Abd Acute W/chest  09/02/2014   CLINICAL DATA:  Acute epigastric and right-sided abdominal pain today. Shortness of breath.  EXAM: ACUTE ABDOMEN SERIES (ABDOMEN 2 VIEW & CHEST 1 VIEW)  COMPARISON:  Chest x-ray 09/02/2014  FINDINGS: The upright chest x-ray demonstrates stable cardiac enlargement. The mediastinal and hilar contours are stable. Low lung volumes with vascular crowding and atelectasis. Mild vascular congestion without overt pulmonary edema.  Two views of the abdomen demonstrate scattered air in the colon. There are air-filled small bowel loops air-fluid levels but no significant distention. Could not exclude an early or partial small bowel obstruction. No free air. The soft tissue shadows are maintained. The bony structures are intact.  IMPRESSION: 1. Stable cardiac enlargement. 2. Low lung volumes with vascular crowding, vascular congestion and bibasilar atelectasis. 3. Possible early or partial small bowel obstruction.   Electronically Signed   By: Kalman Jewels M.D.   On: 09/02/2014 18:00   Dg Abd Portable 1v  09/04/2014   CLINICAL DATA:  Abdominal pain, not otherwise specified. Previous history stated right-sided abdominal and pelvic pain following colonoscopy. Acute cholecystitis. Percutaneous cholecystostomy.  EXAM: PORTABLE ABDOMEN - 1 VIEW  COMPARISON:  09/04/2014.  09/02/2014.  FINDINGS: Drainage catheter in the right upper quadrant consistent with cholecystostomy. No sign of free air. Bowel gas pattern does not show evidence of obstruction. Chronic degenerative changes affect the spine.  IMPRESSION: Cholecystostomy tube remains in place. No complicating feature by radiography.   Electronically Signed   By: Nelson Chimes M.D.   On: 09/04/2014 15:16   US Abdomen Limited Ruq  09/03/2014   CLINICAL DATA:  Elevated bilirubin level.  EXAM: US ABDOMEN LIMITED - RIGHT UPPER QUADRANT  COMPARISON:  CT scan of September 02, 2014.  FINDINGS: Gallbladder:  Small gallstone is noted in neck of gallbladder, with sludge present within gallbladder lumen. Gallbladder wall is mildly thickened at 4.7 mm without definite pericholecystic fluid. No sonographic Murphy's sign is noted.  Common bile duct:  Diameter: 3.5 mm which is within normal limits.  Liver:  No focal abnormality is noted. Increased echogenicity is noted suggesting fatty infiltration. Small amount of free fluid is seen adjacent to the liver.  IMPRESSION: Solitary gallstone is noted in the neck of the gallbladder with sludge present within gallbladder lumen. Mild gallbladder wall thickening is noted and these findings may represent acute cholecystitis. HIDA scan may be performed for further evaluation.  Fatty infiltration of the liver is noted. Small amount of free fluid is seen adjacent to the right hepatic lobe.   Electronically Signed   By: Sabino Dick M.D.   On: 09/03/2014 21:25    Microbiology: Recent Results (from the past 240 hour(s))  Culture, blood (routine x 2)     Status: None   Collection Time: 09/02/14  5:07 PM  Result Value Ref Range Status   Specimen Description BLOOD BLOOD LEFT FOREARM  Final   Special Requests BOTTLES DRAWN AEROBIC AND ANAEROBIC 5 CC  Final   Culture  Setup Time   Final    09/02/2014 23:04 Performed at [facility]    Culture   Final    NO GROWTH 5 DAYS Performed  at [facility]    Report Status 09/08/2014 FINAL  Final  Culture, blood (routine x 2)     Status: None   Collection Time: 09/02/14  5:07 PM  Result Value Ref Range Status   Specimen Description BLOOD BLOOD RIGHT FOREARM  Final   Special Requests BOTTLES DRAWN AEROBIC AND ANAEROBIC 5 CC  Final   Culture  Setup Time   Final    09/02/2014 23:04 Performed at [facility]    Culture   Final    NO GROWTH 5 DAYS Performed at [facility]    Report Status 09/08/2014 FINAL  Final  MRSA PCR Screening     Status: Abnormal   Collection Time: 09/03/14 12:27 AM  Result Value Ref Range Status   MRSA by PCR INVALID RESULTS, SPECIMEN SENT FOR CULTURE (A) NEGATIVE Final  MRSA culture     Status: None   Collection Time: 09/03/14 12:27 AM  Result Value Ref Range Status   Specimen Description NASOPHARYNGEAL  Final   Special Requests NONE  Final   Culture   Final    NO STAPHYLOCOCCUS AUREUS ISOLATED Note: No MRSA Isolated Performed at [facility]    Report Status 09/05/2014 FINAL  Final  Culture, routine-abscess     Status: None   Collection Time: 09/04/14 10:01 AM  Result Value Ref Range Status   Specimen Description GALL BLADDER  Final   Special Requests Normal  Final   Gram Stain   Final  NO WBC SEEN NO SQUAMOUS EPITHELIAL CELLS SEEN NO ORGANISMS SEEN Performed at [facility]    Culture   Final    NO GROWTH 3 DAYS Performed at [facility]    Report Status 09/07/2014 FINAL  Final  Anaerobic culture     Status: None (Preliminary result)   Collection Time: 09/04/14 10:01 AM  Result Value Ref Range Status   Specimen Description GALL BLADDER ABSCESS  Final   Special Requests Normal  Final   Gram Stain   Final    NO WBC SEEN NO SQUAMOUS EPITHELIAL CELLS SEEN NO ORGANISMS SEEN Performed at [facility]    Culture   Final    NO ANAEROBES ISOLATED; CULTURE IN PROGRESS FOR 5 DAYS Performed at [facility]     Report Status PENDING  Incomplete  Culture, blood (routine x 2)     Status: None (Preliminary result)   Collection Time: 09/04/14 10:27 AM  Result Value Ref Range Status   Specimen Description BLOOD RIGHT ARM  Final   Special Requests BOTTLES DRAWN AEROBIC AND ANAEROBIC 10CC  Final   Culture  Setup Time   Final    09/04/2014 12:39 Performed at [facility]    Culture   Final           BLOOD CULTURE RECEIVED NO GROWTH TO DATE CULTURE WILL BE HELD FOR 5 DAYS BEFORE ISSUING A FINAL NEGATIVE REPORT Performed at [facility]    Report Status PENDING  Incomplete  Culture, blood (routine x 2)     Status: None (Preliminary result)   Collection Time: 09/04/14 10:30 AM  Result Value Ref Range Status   Specimen Description BLOOD LEFT HAND  Final   Special Requests BOTTLES DRAWN AEROBIC AND ANAEROBIC [facility]  Final   Culture  Setup Time   Final    09/04/2014 12:39 Performed at [facility]    Culture   Final           BLOOD CULTURE RECEIVED NO GROWTH TO DATE CULTURE WILL BE HELD FOR 5 DAYS BEFORE ISSUING A FINAL NEGATIVE REPORT Performed at [facility]    Report Status PENDING  Incomplete  Clostridium Difficile by PCR     Status: None   Collection Time: 09/05/14 10:21 AM  Result Value Ref Range Status   C difficile by pcr NEGATIVE NEGATIVE Final    Comment: Performed at [facility]: Basic Metabolic Panel:  Recent Labs Lab 09/04/14 0437 09/05/14 0330 09/06/14 0358 09/07/14 0515 09/08/14 0743  NA 137 140 136* 140 132*  K 3.8 3.0* 3.1* 3.3* 3.4*  CL 100 100 97 98 94*  CO2 22 26 27 27 27   GLUCOSE 153* 136* 133* 139* 122*  BUN 32* 29* 21 18 13   CREATININE 1.29 1.12 0.92 0.94 1.02  CALCIUM 8.7 9.0 8.6 8.8 8.2*  MG  --   --  2.1 2.0  --   PHOS  --   --  2.1* 2.7  --    Liver Function Tests:  Recent Labs Lab 09/02/14 1706 09/03/14 0751 09/04/14 0437 09/05/14 0330  AST 39* 32 22 24  ALT 26 26 21 19   ALKPHOS 94 92 87 104   BILITOT 4.0* 4.0* 1.8* 1.2  PROT 7.2 7.0 6.4 6.5  ALBUMIN 2.9* 2.7* 2.4* 2.3*    Recent Labs Lab 09/02/14 1706 09/03/14 0751  LIPASE 145* 40   No results for input(s): AMMONIA in the  last 168 hours. CBC:  Recent Labs Lab 09/02/14 1706  09/04/14 0437 09/05/14 0330 09/06/14 0358 09/07/14 0515 09/08/14 0743  WBC 19.2*  < > 17.3* 13.3* 10.8* 10.0 8.1  NEUTROABS 16.7*  --   --   --   --   --   --   HGB 12.6*  < > 11.7* 11.3* 11.0* 12.4* 11.2*  HCT 37.5*  < > 34.5* 34.2* 33.2* 37.3* 34.3*  MCV 85.0  < > 85.6 85.9 85.8 86.5 85.5  PLT 149*  < > 172 205 218 235 240  < > = values in this interval not displayed. Cardiac Enzymes:  Recent Labs Lab 09/04/14 1027  TROPONINI <0.30   BNP: BNP (last 3 results)  Recent Labs  08/19/14 0851 09/02/14 1706  PROBNP 26.0 800.4*   CBG: No results for input(s): GLUCAP in the last 168 hours.  Time coordinating discharge: Over 30 minutes

## 2014-09-08 NOTE — Telephone Encounter (Signed)
To Dr. Turner for review. 

## 2014-09-08 NOTE — Progress Notes (Signed)
ANTIBIOTIC CONSULT NOTE - FOLLOW UP  Pharmacy Consult for Zosyn Indication: acute cholecystitis  Allergies  Allergen Reactions  . Ace Inhibitors     Lips inflated to about "20 pound"  . Clonidine Derivatives Rash    Patient Measurements: Height: 5\' 10"  (177.8 cm) Weight: 196 lb 12.8 oz (89.268 kg) IBW/kg (Calculated) : 73   Vital Signs: Temp: 98.2 F (36.8 C) (12/08 0450) Temp Source: Oral (12/08 0450) BP: 116/64 mmHg (12/08 0450) Pulse Rate: 84 (12/08 0450) Intake/Output from previous day: 12/07 0701 - 12/08 0700 In: 720 [P.O.:720] Out: 770 [Urine:700; Drains:70] Intake/Output from this shift:    Labs:  Recent Labs  09/06/14 0358 09/07/14 0515  WBC 10.8* 10.0  HGB 11.0* 12.4*  PLT 218 235  CREATININE 0.92 0.94   Estimated Creatinine Clearance: 72.8 mL/min (by C-G formula based on Cr of 0.94).    Microbiology: 12/2 blood x 2: ngtd 12/2 UA [+ for pyuria] but no culture 12/3 MRSA Cx nasopharyngeal: no Staph aureus isolated 12/4 gall bladder abscess: NG, anaerobic cx still P 12/4 blood x 2: ngtd 12/5 C.diff PCR: negative  Antimicrobial Drugs: 12/2 >> zosyn >> 12/2 >> vancomycin>>12/2 12/4 >> levofloxacin >> 12/5  Assessment: 80 y/o M with acute cholecystitis and stone in cystic duct.  Orders received to begin Zosyn with pharmacy dosing assistance.  Cholecystotomy performed 12/4 with placement of indwelling biliary drain.  Cholecystectomy is planned in 6 to 8 weeks.  Goal of Therapy:  Appropriate antibiotic dosing for indication and renal function; eradication of infection.  Today, 12/8: D#7 Zosyn 3.375 grams IV q8h (extended-infusion) Afebrile Leukocytosis resolved Culture data unrevealing to date Renal function stable  Plan:  1. Continue Zosyn 3.375 grams IV q8h (extended-infusion, each dose over 4 hours). 2. Follow culture results, renal function, clinical course.    Clayburn Pert, PharmD, BCPS Pager: [phone] 09/08/2014  8:01 AM

## 2014-09-08 NOTE — Progress Notes (Signed)
  Subjective: He looks good and is for discharge today.  He will follow up with Dr. Harlow Asa for surgery.  Objective: Vital signs in last 24 hours: Temp:  [98.1 F (36.7 C)-98.5 F (36.9 C)] 98.2 F (36.8 C) (12/08 0450) Pulse Rate:  [84-88] 84 (12/08 0450) Resp:  [18-20] 20 (12/08 0450) BP: (116-149)/(60-74) 116/64 mmHg (12/08 0450) SpO2:  [97 %-98 %] 97 % (12/08 0450) Weight:  [89.268 kg (196 lb 12.8 oz)] 89.268 kg (196 lb 12.8 oz) (12/08 0456) Last BM Date: 09/08/14 960 PO +BM Afebrile, VSS  Intake/Output from previous day: 12/07 0701 - 12/08 0700 In: 25 [P.O.:960] Out: 770 [Urine:700; Drains:70] Intake/Output this shift: Total I/O In: -  Out: 100 [Drains:100]  General appearance: alert, cooperative and no distress GI: soft, much less tender. drainage looks fine.  Lab Results:   Recent Labs  09/07/14 0515 09/08/14 0743  WBC 10.0 8.1  HGB 12.4* 11.2*  HCT 37.3* 34.3*  PLT 235 240    BMET  Recent Labs  09/07/14 0515 09/08/14 0743  NA 140 132*  K 3.3* 3.4*  CL 98 94*  CO2 27 27  GLUCOSE 139* 122*  BUN 18 13  CREATININE 0.94 1.02  CALCIUM 8.8 8.2*   PT/INR No results for input(s): LABPROT, INR in the last 72 hours.   Recent Labs Lab 09/02/14 1706 09/03/14 0751 09/04/14 0437 09/05/14 0330  AST 39* 32 22 24  ALT 26 26 21 19   ALKPHOS 94 92 87 104  BILITOT 4.0* 4.0* 1.8* 1.2  PROT 7.2 7.0 6.4 6.5  ALBUMIN 2.9* 2.7* 2.4* 2.3*     Lipase     Component Value Date/Time   LIPASE 40 09/03/2014 0751     Studies/Results: No results found.  Medications: . antiseptic oral rinse  7 mL Mouth Rinse q12n4p  . bimatoprost  1 drop Both Eyes QHS  . chlorhexidine  15 mL Mouth Rinse BID  . doxazosin  4 mg Oral Daily  . folic acid  1 mg Oral Daily  . furosemide  20 mg Oral Daily  . metoprolol tartrate  25 mg Oral BID  . piperacillin-tazobactam (ZOSYN)  IV  3.375 g Intravenous Q8H  . rivaroxaban  20 mg Oral Q supper  . sodium chloride  3 mL  Intravenous Q12H    Assessment/Plan 1. Acute cholecystitis - Perc drain - 09/04/2014 - Dr. Rolla Plate Levaquin/Zosyn 2. A. Fib 3. Chronic anticoagulation INR - 2.36 on 09/04/2014 On Xarelto 4. HTN 5. OSA 6. Chronic CHF 7. DVT prophylaxis - on hold for anticoagulation 8. Some loose stools - C. Diff neg - 09/05/2014    Plan:  Home today and follow up in 3 weeks.    LOS: 6 days    Cameron [name] 09/08/2014

## 2014-09-09 ENCOUNTER — Telehealth: Payer: Self-pay | Admitting: General Practice

## 2014-09-09 DIAGNOSIS — K8011 Calculus of gallbladder with chronic cholecystitis with obstruction: Secondary | ICD-10-CM | POA: Diagnosis not present

## 2014-09-09 DIAGNOSIS — I10 Essential (primary) hypertension: Secondary | ICD-10-CM | POA: Diagnosis not present

## 2014-09-09 DIAGNOSIS — I5033 Acute on chronic diastolic (congestive) heart failure: Secondary | ICD-10-CM | POA: Diagnosis not present

## 2014-09-09 DIAGNOSIS — G4733 Obstructive sleep apnea (adult) (pediatric): Secondary | ICD-10-CM | POA: Diagnosis not present

## 2014-09-09 DIAGNOSIS — I480 Paroxysmal atrial fibrillation: Secondary | ICD-10-CM | POA: Diagnosis not present

## 2014-09-09 DIAGNOSIS — Z48815 Encounter for surgical aftercare following surgery on the digestive system: Secondary | ICD-10-CM | POA: Diagnosis not present

## 2014-09-09 DIAGNOSIS — Z7901 Long term (current) use of anticoagulants: Secondary | ICD-10-CM | POA: Diagnosis not present

## 2014-09-09 LAB — ANAEROBIC CULTURE
Gram Stain: NONE SEEN
SPECIAL REQUESTS: NORMAL

## 2014-09-09 NOTE — Telephone Encounter (Signed)
New message        Pt son is calling to set up PFT for pt / please give pt a call

## 2014-09-09 NOTE — Telephone Encounter (Signed)
Called [facility] and instructed them to call and schedule with patient.

## 2014-09-09 NOTE — Telephone Encounter (Signed)
Reordered and sent to [facility] for scheduling.

## 2014-09-10 DIAGNOSIS — I480 Paroxysmal atrial fibrillation: Secondary | ICD-10-CM | POA: Diagnosis not present

## 2014-09-10 DIAGNOSIS — Z48815 Encounter for surgical aftercare following surgery on the digestive system: Secondary | ICD-10-CM | POA: Diagnosis not present

## 2014-09-10 DIAGNOSIS — I5033 Acute on chronic diastolic (congestive) heart failure: Secondary | ICD-10-CM | POA: Diagnosis not present

## 2014-09-10 DIAGNOSIS — Z7901 Long term (current) use of anticoagulants: Secondary | ICD-10-CM | POA: Diagnosis not present

## 2014-09-10 DIAGNOSIS — I10 Essential (primary) hypertension: Secondary | ICD-10-CM | POA: Diagnosis not present

## 2014-09-10 DIAGNOSIS — K8011 Calculus of gallbladder with chronic cholecystitis with obstruction: Secondary | ICD-10-CM | POA: Diagnosis not present

## 2014-09-10 LAB — CULTURE, BLOOD (ROUTINE X 2)
Culture: NO GROWTH
Culture: NO GROWTH

## 2014-09-11 ENCOUNTER — Ambulatory Visit (INDEPENDENT_AMBULATORY_CARE_PROVIDER_SITE_OTHER): Payer: Medicare Other | Admitting: Internal Medicine

## 2014-09-11 ENCOUNTER — Other Ambulatory Visit: Payer: Self-pay

## 2014-09-11 DIAGNOSIS — I5033 Acute on chronic diastolic (congestive) heart failure: Secondary | ICD-10-CM | POA: Diagnosis not present

## 2014-09-11 DIAGNOSIS — R0602 Shortness of breath: Secondary | ICD-10-CM | POA: Diagnosis not present

## 2014-09-11 DIAGNOSIS — Z7901 Long term (current) use of anticoagulants: Secondary | ICD-10-CM | POA: Diagnosis not present

## 2014-09-11 DIAGNOSIS — I480 Paroxysmal atrial fibrillation: Secondary | ICD-10-CM | POA: Diagnosis not present

## 2014-09-11 DIAGNOSIS — G4733 Obstructive sleep apnea (adult) (pediatric): Secondary | ICD-10-CM

## 2014-09-11 DIAGNOSIS — Z48815 Encounter for surgical aftercare following surgery on the digestive system: Secondary | ICD-10-CM | POA: Diagnosis not present

## 2014-09-11 DIAGNOSIS — I10 Essential (primary) hypertension: Secondary | ICD-10-CM | POA: Diagnosis not present

## 2014-09-11 DIAGNOSIS — K8011 Calculus of gallbladder with chronic cholecystitis with obstruction: Secondary | ICD-10-CM | POA: Diagnosis not present

## 2014-09-11 LAB — PULMONARY FUNCTION TEST
DL/VA % PRED: 122 %
DL/VA: 5.63 ml/min/mmHg/L
DLCO unc % pred: 76 %
DLCO unc: 24.7 ml/min/mmHg
FEF 25-75 Post: 2.89 L/sec
FEF 25-75 Pre: 1.96 L/sec
FEF2575-%CHANGE-POST: 47 %
FEF2575-%PRED-PRE: 94 %
FEF2575-%Pred-Post: 139 %
FEV1-%CHANGE-POST: 12 %
FEV1-%Pred-Post: 78 %
FEV1-%Pred-Pre: 70 %
FEV1-POST: 2.32 L
FEV1-Pre: 2.07 L
FEV1FVC-%CHANGE-POST: 8 %
FEV1FVC-%Pred-Pre: 109 %
FEV6-%CHANGE-POST: 4 %
FEV6-%PRED-PRE: 67 %
FEV6-%Pred-Post: 70 %
FEV6-Post: 2.71 L
FEV6-Pre: 2.59 L
FEV6FVC-%Change-Post: 0 %
FEV6FVC-%Pred-Post: 107 %
FEV6FVC-%Pred-Pre: 106 %
FVC-%Change-Post: 3 %
FVC-%PRED-POST: 65 %
FVC-%Pred-Pre: 63 %
FVC-PRE: 2.62 L
FVC-Post: 2.71 L
POST FEV1/FVC RATIO: 86 %
POST FEV6/FVC RATIO: 100 %
PRE FEV6/FVC RATIO: 99 %
Pre FEV1/FVC ratio: 79 %
RV % PRED: 65 %
RV: 1.72 L
TLC % pred: 61 %
TLC: 4.35 L

## 2014-09-11 NOTE — Progress Notes (Signed)
PFT done today. 

## 2014-09-14 ENCOUNTER — Telehealth: Payer: Self-pay

## 2014-09-14 DIAGNOSIS — J984 Other disorders of lung: Secondary | ICD-10-CM

## 2014-09-14 DIAGNOSIS — I5033 Acute on chronic diastolic (congestive) heart failure: Secondary | ICD-10-CM | POA: Diagnosis not present

## 2014-09-14 DIAGNOSIS — I480 Paroxysmal atrial fibrillation: Secondary | ICD-10-CM | POA: Diagnosis not present

## 2014-09-14 DIAGNOSIS — Z7901 Long term (current) use of anticoagulants: Secondary | ICD-10-CM | POA: Diagnosis not present

## 2014-09-14 DIAGNOSIS — Z48815 Encounter for surgical aftercare following surgery on the digestive system: Secondary | ICD-10-CM | POA: Diagnosis not present

## 2014-09-14 DIAGNOSIS — K8011 Calculus of gallbladder with chronic cholecystitis with obstruction: Secondary | ICD-10-CM | POA: Diagnosis not present

## 2014-09-14 DIAGNOSIS — I10 Essential (primary) hypertension: Secondary | ICD-10-CM | POA: Diagnosis not present

## 2014-09-14 NOTE — Telephone Encounter (Signed)
Patient informed of PFT results and verbal understanding expressed.  Instructed patient he will be called to schedule appointment with Pulmonary. Patient agrees with treatment plan.   Referral placed.

## 2014-09-14 NOTE — Telephone Encounter (Signed)
-----   Message from Sueanne Margarita, MD sent at 09/14/2014  8:43 AM EST ----- Please let patient know that he has evidence of severe restrictive lung disease on PFTs and set up with pulmonary for evaluation

## 2014-09-15 DIAGNOSIS — R609 Edema, unspecified: Secondary | ICD-10-CM | POA: Diagnosis not present

## 2014-09-15 DIAGNOSIS — E538 Deficiency of other specified B group vitamins: Secondary | ICD-10-CM | POA: Diagnosis not present

## 2014-09-15 DIAGNOSIS — K819 Cholecystitis, unspecified: Secondary | ICD-10-CM | POA: Diagnosis not present

## 2014-09-16 DIAGNOSIS — I10 Essential (primary) hypertension: Secondary | ICD-10-CM | POA: Diagnosis not present

## 2014-09-16 DIAGNOSIS — Z48815 Encounter for surgical aftercare following surgery on the digestive system: Secondary | ICD-10-CM | POA: Diagnosis not present

## 2014-09-16 DIAGNOSIS — I5033 Acute on chronic diastolic (congestive) heart failure: Secondary | ICD-10-CM | POA: Diagnosis not present

## 2014-09-16 DIAGNOSIS — Z7901 Long term (current) use of anticoagulants: Secondary | ICD-10-CM | POA: Diagnosis not present

## 2014-09-16 DIAGNOSIS — K8011 Calculus of gallbladder with chronic cholecystitis with obstruction: Secondary | ICD-10-CM | POA: Diagnosis not present

## 2014-09-16 DIAGNOSIS — I480 Paroxysmal atrial fibrillation: Secondary | ICD-10-CM | POA: Diagnosis not present

## 2014-09-18 DIAGNOSIS — K8011 Calculus of gallbladder with chronic cholecystitis with obstruction: Secondary | ICD-10-CM | POA: Diagnosis not present

## 2014-09-18 DIAGNOSIS — I480 Paroxysmal atrial fibrillation: Secondary | ICD-10-CM | POA: Diagnosis not present

## 2014-09-18 DIAGNOSIS — I5033 Acute on chronic diastolic (congestive) heart failure: Secondary | ICD-10-CM | POA: Diagnosis not present

## 2014-09-18 DIAGNOSIS — Z7901 Long term (current) use of anticoagulants: Secondary | ICD-10-CM | POA: Diagnosis not present

## 2014-09-18 DIAGNOSIS — I10 Essential (primary) hypertension: Secondary | ICD-10-CM | POA: Diagnosis not present

## 2014-09-18 DIAGNOSIS — Z48815 Encounter for surgical aftercare following surgery on the digestive system: Secondary | ICD-10-CM | POA: Diagnosis not present

## 2014-09-22 DIAGNOSIS — L603 Nail dystrophy: Secondary | ICD-10-CM | POA: Diagnosis not present

## 2014-09-22 DIAGNOSIS — M79609 Pain in unspecified limb: Secondary | ICD-10-CM | POA: Diagnosis not present

## 2014-09-22 DIAGNOSIS — I739 Peripheral vascular disease, unspecified: Secondary | ICD-10-CM | POA: Diagnosis not present

## 2014-09-23 DIAGNOSIS — I5033 Acute on chronic diastolic (congestive) heart failure: Secondary | ICD-10-CM | POA: Diagnosis not present

## 2014-09-23 DIAGNOSIS — K8011 Calculus of gallbladder with chronic cholecystitis with obstruction: Secondary | ICD-10-CM | POA: Diagnosis not present

## 2014-09-23 DIAGNOSIS — Z48815 Encounter for surgical aftercare following surgery on the digestive system: Secondary | ICD-10-CM | POA: Diagnosis not present

## 2014-09-23 DIAGNOSIS — I10 Essential (primary) hypertension: Secondary | ICD-10-CM | POA: Diagnosis not present

## 2014-09-23 DIAGNOSIS — Z7901 Long term (current) use of anticoagulants: Secondary | ICD-10-CM | POA: Diagnosis not present

## 2014-09-23 DIAGNOSIS — I480 Paroxysmal atrial fibrillation: Secondary | ICD-10-CM | POA: Diagnosis not present

## 2014-09-28 ENCOUNTER — Ambulatory Visit (INDEPENDENT_AMBULATORY_CARE_PROVIDER_SITE_OTHER)
Admission: RE | Admit: 2014-09-28 | Discharge: 2014-09-28 | Disposition: A | Discharge status: Home / Self Care | Payer: Medicare Other | Source: Ambulatory Visit | Attending: Internal Medicine | Admitting: Internal Medicine

## 2014-09-28 ENCOUNTER — Encounter: Payer: Self-pay | Admitting: Internal Medicine

## 2014-09-28 ENCOUNTER — Ambulatory Visit (INDEPENDENT_AMBULATORY_CARE_PROVIDER_SITE_OTHER): Payer: Medicare Other | Admitting: Internal Medicine

## 2014-09-28 VITALS — BP 162/80 | HR 97 | Ht 72.0 in | Wt 198.8 lb

## 2014-09-28 DIAGNOSIS — R0600 Dyspnea, unspecified: Secondary | ICD-10-CM | POA: Diagnosis not present

## 2014-09-28 DIAGNOSIS — R053 Chronic cough: Secondary | ICD-10-CM

## 2014-09-28 DIAGNOSIS — R05 Cough: Secondary | ICD-10-CM

## 2014-09-28 DIAGNOSIS — J984 Other disorders of lung: Secondary | ICD-10-CM | POA: Diagnosis not present

## 2014-09-28 DIAGNOSIS — R0602 Shortness of breath: Secondary | ICD-10-CM | POA: Diagnosis not present

## 2014-09-28 NOTE — Progress Notes (Signed)
Subjective:    Patient ID: Cameron [name], male    DOB: [birth year], MRN: [id number]  HPI  80 yowm quit smoking 1976 worked in [employer] and had no symptoms until first noted doe in 2007 assoc with dx of afib with abn pfts so referred by Dr Tressia Miners turner whom he sees for permanent AF  for pulmonary eval 09/28/2014 sp admit  Admit date: 09/02/2014 Discharge date: 09/08/2014  Recommendations for Outpatient Follow-up:  1. Please continue taking Lasix 40 mg daily. Is there is more swelling in legs you may take half a tablet extra, 20 mg. Usually signs of worsening heart failure may present with more leg swelling, shortness of breath or weight gain.  2. Please take Augmentin for 10 days on discharge. This was confirmed with infectious disease specialist prior to discharge, Dr. Michel Bickers.  Discharge Diagnoses:  Principal Problem:  Acute cholecystitis Active Problems:  Chronic atrial fibrillation  Hypertension  Chronic anticoagulation  Chronic diastolic heart failure  OSA (obstructive sleep apnea)  Elevated lipase  Elevated brain natriuretic peptide (BNP) level  Elevated bilirubin  Hyperglycemia  Acute confusional state  Cholelithiasis  Choledocholithiasis  IFG (impaired fasting glucose)  Acute respiratory failure  Acute on chronic diastolic CHF (congestive heart failure)  Hypokalemia  Abdominal pain  Acute respiratory distress   History of present illness:  80 y.o. male with a PMH of hypertension, OSA, neuropathy, atrial fibrillation on chronic Xarelto and chronic diastolic heart failure who was admitted 09/02/14 with a chief complaint of RUQ pain and reports of confusion. He saw his PCP and was found to have hyperbilirubinemia. A CT scan of the abdomen, done on admission, showed acute cholecystitis. The patient underwent a cholecystostomy on 09/04/14, and became agitated with increased confusion during the procedure and post procedure had pain  out of proportion to procedure performed. He also required Bipap post procedure. A CT scan showed appropriate placement of his cholecystostomy tube with no evidence of complications.   09/28/2014 1st [facility] office visit/ Deangela Randleman   Chief Complaint  Patient presents with  . Pulmonary Consult    Referred by Dr. Fransico Him for eval of restricztive lung dz.  Pt c/o DOE with taking out the trash "for years". He states that his breathing does not keep him from doing the things he wants.   fairly abrupt onset around 2007 doe p dx afib dragging empty trash cans up to the house up slt incline and feels he has now recovered from above admit  Has h/o lip swelling from acei Some dry tickle with speech not noct- sense of throat drainage but no mucus, feels like something stuck in his throat x years no worse lately   On cpap x April 2015   No obvious day to day or daytime variabilty or assoc excess or purulent mucus or cp or chest tightness, subjective wheeze overt sinus or hb symptoms. No unusual exp hx or h/o childhood pna/ asthma or knowledge of premature birth.  Sleeping ok without nocturnal  or early am exacerbation  of respiratory  c/o's or need for noct saba. Also denies any obvious fluctuation of symptoms with weather or environmental changes or other aggravating or alleviating factors except as outlined above   Current Medications, Allergies, Complete Past Medical History, Past Surgical History, Family History, and Social History were reviewed in [employer] record.         Review of Systems  Constitutional: Negative for fever, chills, activity change, appetite  change and unexpected weight change.  HENT: Negative for congestion, dental problem, postnasal drip, rhinorrhea, sneezing, sore throat, trouble swallowing and voice change.   Eyes: Negative for visual disturbance.  Respiratory: Positive for shortness of breath. Negative for cough and choking.     Cardiovascular: Positive for leg swelling. Negative for chest pain.  Gastrointestinal: Negative for nausea, vomiting and abdominal pain.  Genitourinary: Negative for difficulty urinating.  Musculoskeletal: Positive for arthralgias.  Skin: Negative for rash.  Psychiatric/Behavioral: Negative for behavioral problems and confusion.       Objective:   Physical Exam  amb wm slt voice fatigue/ throat clearing  Wt Readings from Last 3 Encounters:  09/28/14 198 lb 12.8 oz (90.175 kg)  09/08/14 196 lb 12.8 oz (89.268 kg)  08/24/14 208 lb (94.348 kg)    Vital signs reviewed  HEENT: edentulous/ dentures in place   With nl  turbinates, and orophanx which is pristine. Nl external ear canals without cough reflex   NECK :  without JVD/Nodes/TM/ nl carotid upstrokes bilaterally   LUNGS: no acc muscle use, clear to A and P bilaterally without cough on insp or exp maneuvers   CV:  RRR  no s3 or murmur or increase in P2, no edema   ABD:  soft and nontender with nl excursion in the supine position. No bruits or organomegaly, bowel sounds nl  MS:  warm without deformities, calf tenderness, cyanosis or clubbing  SKIN: warm and dry without lesions    NEURO:  alert, approp, no deficits   CXR PA and Lateral:   09/28/2014 : There is minimal persistent increased density in the right lower lobe which may reflect subsegmental atelectasis or fibrosis. There is no evidence of CHF. Overall there has been significant improvement in the appearance of the chest since the earlier studies.      Assessment & Plan:

## 2014-09-28 NOTE — Patient Instructions (Signed)
Please remember to go to the x-ray department downstairs for your tests - we will call you with the results when they are available.  GERD (REFLUX)  is an extremely common cause of respiratory symptoms just like yours , many times with no obvious heartburn at all.    It can be treated with medication, but also with lifestyle changes including avoidance of late meals, excessive alcohol, smoking cessation, and avoid fatty foods, chocolate, peppermint, colas, red wine, and acidic juices such as orange juice.  NO MINT OR MENTHOL PRODUCTS SO NO COUGH DROPS  USE SUGARLESS CANDY INSTEAD (Jolley ranchers or Stover's or Life Savers) or even ice chips will also do - the key is to swallow to prevent all throat clearing. NO OIL BASED VITAMINS - use powdered substitutes - NO FISH OIL   If the cough is not better > first try Try prilosec 20mg   Take 30-60 min before first meal of the day and Pepcid 20 mg one bedtime until cough is completely gone for at least a week  And if it doesn't go away please return here.

## 2014-09-30 ENCOUNTER — Other Ambulatory Visit (HOSPITAL_COMMUNITY): Payer: Self-pay | Admitting: Interventional Radiology

## 2014-09-30 ENCOUNTER — Other Ambulatory Visit (INDEPENDENT_AMBULATORY_CARE_PROVIDER_SITE_OTHER): Payer: Self-pay

## 2014-09-30 DIAGNOSIS — K810 Acute cholecystitis: Secondary | ICD-10-CM

## 2014-09-30 NOTE — Progress Notes (Signed)
Quick Note:  Spoke with pt and notified of results per Dr. Wert. Pt verbalized understanding and denied any questions.  ______ 

## 2014-10-01 ENCOUNTER — Encounter: Payer: Self-pay | Admitting: Internal Medicine

## 2014-10-01 DIAGNOSIS — R05 Cough: Secondary | ICD-10-CM | POA: Insufficient documentation

## 2014-10-01 DIAGNOSIS — R053 Chronic cough: Secondary | ICD-10-CM | POA: Insufficient documentation

## 2014-10-01 NOTE — Assessment & Plan Note (Signed)
Seen pulmonary clinic 09/28/14 > rec rx for gerd then trial return if not better   Comment This appears to be a mild case of Classic Upper airway cough syndrome, so named because it's frequently impossible to sort out how much is  CR/sinusitis with freq throat clearing (which can be related to primary GERD)   vs  causing  secondary (" extra esophageal")  GERD from wide swings in gastric pressure that occur with throat clearing, often  promoting self use of mint and menthol lozenges that reduce the lower esophageal sphincter tone and exacerbate the problem further in a cyclical fashion.   These are the same pts (now being labeled as having "irritable larynx syndrome" by some cough centers) who not infrequently have a history of having failed to tolerate ace inhibitors,  dry powder inhalers or biphosphonates or report having atypical reflux symptoms that don't respond to standard doses of PPI , and are easily confused as having aecopd or asthma flares by even experienced allergists/ pulmonologists.

## 2014-10-01 NOTE — Assessment & Plan Note (Signed)
-   PFTs  09/11/14 FEV1 2.32 (78%) ratio 86 p 12% improvement from saba and dlco 76% done w/in a week of admit with cholecystitis  - 09/28/2014  Walked RA x 2 laps @ 185 ft each stopped due to heart rate 175 min sob, nl pace    - CXR 09/28/14 almost complete resolution of R base changes confirmed to be assoc with R effusion/ compressive atx by CT abd 09/04/14   He does not appear to have any sign pulmonary limitation and certainly does not have sign copd/ ILD so pulmonary f/u is prn

## 2014-10-06 ENCOUNTER — Ambulatory Visit (HOSPITAL_COMMUNITY)
Admission: RE | Admit: 2014-10-06 | Discharge: 2014-10-06 | Disposition: A | Discharge status: Home / Self Care | Payer: Medicare Other | Source: Ambulatory Visit | Attending: Surgery | Admitting: Surgery

## 2014-10-06 ENCOUNTER — Other Ambulatory Visit (INDEPENDENT_AMBULATORY_CARE_PROVIDER_SITE_OTHER): Payer: Self-pay | Admitting: Surgery

## 2014-10-06 ENCOUNTER — Ambulatory Visit (HOSPITAL_COMMUNITY)
Admission: RE | Admit: 2014-10-06 | Discharge: 2014-10-06 | Disposition: A | Discharge status: Home / Self Care | Payer: Medicare Other | Source: Ambulatory Visit | Attending: Interventional Radiology | Admitting: Interventional Radiology

## 2014-10-06 DIAGNOSIS — Z434 Encounter for attention to other artificial openings of digestive tract: Secondary | ICD-10-CM | POA: Insufficient documentation

## 2014-10-06 DIAGNOSIS — Z48815 Encounter for surgical aftercare following surgery on the digestive system: Secondary | ICD-10-CM | POA: Insufficient documentation

## 2014-10-06 DIAGNOSIS — K810 Acute cholecystitis: Secondary | ICD-10-CM | POA: Diagnosis present

## 2014-10-06 DIAGNOSIS — K5730 Diverticulosis of large intestine without perforation or abscess without bleeding: Secondary | ICD-10-CM | POA: Diagnosis not present

## 2014-10-06 MED ORDER — IOHEXOL 300 MG/ML  SOLN
50.0000 mL | Freq: Once | INTRAMUSCULAR | Status: AC | PRN
  Administered 2014-10-06: 10 mL via INTRAVENOUS

## 2014-10-06 MED ORDER — IOHEXOL 300 MG/ML  SOLN
80.0000 mL | Freq: Once | INTRAMUSCULAR | Status: AC | PRN
  Administered 2014-10-06: 80 mL via INTRAVENOUS

## 2014-10-06 NOTE — Procedures (Signed)
Cholecystostomy injection Cystic duct is widely patent. Drain remains in place.  No comp

## 2014-10-14 ENCOUNTER — Ambulatory Visit (INDEPENDENT_AMBULATORY_CARE_PROVIDER_SITE_OTHER): Payer: Self-pay | Admitting: Surgery

## 2014-10-14 ENCOUNTER — Other Ambulatory Visit: Payer: Self-pay

## 2014-10-14 MED ORDER — DOXAZOSIN MESYLATE 4 MG PO TABS: 4.0000 mg | ORAL_TABLET | Freq: Every day | ORAL | Status: DC

## 2014-10-15 ENCOUNTER — Other Ambulatory Visit: Payer: Self-pay | Admitting: *Deleted

## 2014-10-15 MED ORDER — FUROSEMIDE 40 MG PO TABS: ORAL_TABLET | ORAL | Status: DC

## 2014-10-16 DIAGNOSIS — E538 Deficiency of other specified B group vitamins: Secondary | ICD-10-CM | POA: Diagnosis not present

## 2014-10-19 ENCOUNTER — Other Ambulatory Visit (HOSPITAL_COMMUNITY): Payer: Self-pay | Admitting: Interventional Radiology

## 2014-10-19 ENCOUNTER — Telehealth: Payer: Self-pay | Admitting: Cardiology

## 2014-10-19 DIAGNOSIS — K819 Cholecystitis, unspecified: Secondary | ICD-10-CM

## 2014-10-19 NOTE — Telephone Encounter (Signed)
Pt would like to know status of cardiology approval to have the drain removed by [facility]. States it was placed on 09/03/15 He is aware I will send this message to Dr. Radford Pax & her nurse Valetta Fuller.  Horton Chin RN

## 2014-10-19 NOTE — Telephone Encounter (Signed)
New problem   Pt want to know if approval has been sent to Dr Tera Helper at [facility] office for his drainage removal. Please advise pt.

## 2014-10-20 ENCOUNTER — Ambulatory Visit (HOSPITAL_COMMUNITY)
Admission: RE | Admit: 2014-10-20 | Discharge: 2014-10-20 | Disposition: A | Discharge status: Home / Self Care | Payer: Medicare Other | Source: Ambulatory Visit | Attending: Interventional Radiology | Admitting: Interventional Radiology

## 2014-10-20 DIAGNOSIS — K819 Cholecystitis, unspecified: Secondary | ICD-10-CM

## 2014-10-20 DIAGNOSIS — Z434 Encounter for attention to other artificial openings of digestive tract: Secondary | ICD-10-CM | POA: Insufficient documentation

## 2014-10-20 MED ORDER — IOHEXOL 300 MG/ML  SOLN
50.0000 mL | Freq: Once | INTRAMUSCULAR | Status: AC | PRN
  Administered 2014-10-20: 20 mL

## 2014-10-20 NOTE — Procedures (Signed)
Cholecystomy injection demonstrates wide patency of the cystic duct and CBD to level of duodenum. Chole tube capped. Pt to undergo cholecystectomy in the next 2-3 week.  Chole tube to remain in place until the time of surgery. D/W Dr. Harlow Asa.

## 2014-10-20 NOTE — Telephone Encounter (Signed)
Called Dr. Gala Lewandowsky office and asked them to send clearance and supplied fax number.  Spoke with patient and told him we are awaiting fax.

## 2014-10-21 NOTE — Telephone Encounter (Signed)
To [facility]madin Clinic per Dr. Radford Pax.

## 2014-10-21 NOTE — Telephone Encounter (Signed)
Ok to have drain removed but need to send this to coumadin clinic for anticoagulation instructions preop

## 2014-10-22 NOTE — Telephone Encounter (Addendum)
Spoke with pt.  He is actually having his gallbladder removed at the same time they are removing his drain.  Per protocol, okay to hold Xarelto for 48 hours prior to procedure.  He is aware of recommendations.  Information will need to be faxed to surgeon's office.

## 2014-10-26 ENCOUNTER — Telehealth: Payer: Self-pay | Admitting: Cardiology

## 2014-10-26 NOTE — Telephone Encounter (Signed)
Printed and placed in "to be faxed" bin in medical records.

## 2014-10-26 NOTE — Telephone Encounter (Signed)
Patient is stable from cardiac standpoint for cholecystectomy.

## 2014-11-05 DIAGNOSIS — R8299 Other abnormal findings in urine: Secondary | ICD-10-CM | POA: Diagnosis not present

## 2014-11-05 DIAGNOSIS — R109 Unspecified abdominal pain: Secondary | ICD-10-CM | POA: Diagnosis not present

## 2014-11-06 ENCOUNTER — Other Ambulatory Visit (HOSPITAL_COMMUNITY): Payer: Self-pay | Admitting: *Deleted

## 2014-11-06 ENCOUNTER — Encounter (HOSPITAL_COMMUNITY): Payer: Self-pay | Admitting: Pharmacy Technician

## 2014-11-06 NOTE — Pre-Procedure Instructions (Signed)
Cameron [name]  11/06/2014   Your procedure is scheduled on:  Monday, November 16, 2014 at 2:45 PM.   Report to [facility] Entrance "A" Admitting Office at 12:45 PM.   Call this number if you have problems the morning of surgery: [phone]               Any questions prior to day of surgery, please call [phone] between 8 & 4 PM.   Remember:   Do not eat food or drink liquids after midnight Sunday, 11/15/14.   Take these medicines the morning of surgery with A SIP OF WATER: Doxazosin (Cardura), Metoprol Succinate (Toprol XL), Tramadol - if needed.  Stop Vitamins and NSAIDS (Mobic, Ibuprofen, Aleve, etc.) as of today.  Stop Xarelto 48 hours prior to day surgery as instructed by physician.   Do not wear jewelry.  Do not wear lotions, powders, or cologne. You may wear deodorant.  Men may shave face and neck.  Do not bring valuables to the hospital.  [facility] is not responsible                  for any belongings or valuables.               Contacts, dentures or bridgework may not be worn into surgery.  Leave suitcase in the car. After surgery it may be brought to your room.  For patients admitted to the hospital, discharge time is determined by your                treatment team.               Special Instructions: Cameron [name] - Preparing for Surgery  Before surgery, you can play an important role.  Because skin is not sterile, your skin needs to be as free of germs as possible.  You can reduce the number of germs on you skin by washing with CHG (chlorahexidine gluconate) soap before surgery.  CHG is an antiseptic cleaner which kills germs and bonds with the skin to continue killing germs even after washing.  Please DO NOT use if you have an allergy to CHG or antibacterial soaps.  If your skin becomes reddened/irritated stop using the CHG and inform your nurse when you arrive at Short Stay.  Do not shave (including legs and underarms) for at least 48 hours prior to the  first CHG shower.  You may shave your face.  Please follow these instructions carefully:   1.  Shower with CHG Soap the night before surgery and the                                morning of Surgery.  2.  If you choose to wash your hair, wash your hair first as usual with your       normal shampoo.  3.  After you shampoo, rinse your hair and body thoroughly to remove the                      Shampoo.  4.  Use CHG as you would any other liquid soap.  You can apply chg directly       to the skin and wash gently with scrungie or a clean washcloth.  5.  Apply the CHG Soap to your body ONLY FROM THE NECK DOWN.        Do not use  on open wounds or open sores.  Avoid contact with your eyes, ears, mouth and genitals (private parts).  Wash genitals (private parts) with your normal soap.  6.  Wash thoroughly, paying special attention to the area where your surgery        will be performed.  7.  Thoroughly rinse your body with warm water from the neck down.  8.  DO NOT shower/wash with your normal soap after using and rinsing off       the CHG Soap.  9.  Pat yourself dry with a clean towel.            10.  Wear clean pajamas.            11.  Place clean sheets on your bed the night of your first shower and do not        sleep with pets.  Day of Surgery  Do not apply any lotions the morning of surgery.  Please wear clean clothes to the hospital.     Please read over the following fact sheets that you were given: Pain Booklet, Coughing and Deep Breathing and Surgical Site Infection Prevention

## 2014-11-09 ENCOUNTER — Encounter (HOSPITAL_COMMUNITY): Payer: Self-pay

## 2014-11-09 ENCOUNTER — Ambulatory Visit (HOSPITAL_COMMUNITY)
Admission: RE | Admit: 2014-11-09 | Discharge: 2014-11-09 | Disposition: A | Discharge status: Home / Self Care | Payer: Medicare Other | Source: Ambulatory Visit | Attending: Anesthesiology | Admitting: Anesthesiology

## 2014-11-09 ENCOUNTER — Encounter (HOSPITAL_COMMUNITY)
Admission: RE | Admit: 2014-11-09 | Discharge: 2014-11-09 | Disposition: A | Discharge status: Home / Self Care | Payer: Medicare Other | Source: Ambulatory Visit | Attending: Surgery | Admitting: Surgery

## 2014-11-09 DIAGNOSIS — K219 Gastro-esophageal reflux disease without esophagitis: Secondary | ICD-10-CM | POA: Insufficient documentation

## 2014-11-09 DIAGNOSIS — E876 Hypokalemia: Secondary | ICD-10-CM | POA: Diagnosis not present

## 2014-11-09 DIAGNOSIS — G4733 Obstructive sleep apnea (adult) (pediatric): Secondary | ICD-10-CM | POA: Insufficient documentation

## 2014-11-09 DIAGNOSIS — Z01818 Encounter for other preprocedural examination: Secondary | ICD-10-CM | POA: Diagnosis not present

## 2014-11-09 DIAGNOSIS — D649 Anemia, unspecified: Secondary | ICD-10-CM | POA: Diagnosis not present

## 2014-11-09 DIAGNOSIS — Z01812 Encounter for preprocedural laboratory examination: Secondary | ICD-10-CM | POA: Diagnosis not present

## 2014-11-09 DIAGNOSIS — M109 Gout, unspecified: Secondary | ICD-10-CM | POA: Diagnosis not present

## 2014-11-09 DIAGNOSIS — Z87891 Personal history of nicotine dependence: Secondary | ICD-10-CM | POA: Diagnosis not present

## 2014-11-09 DIAGNOSIS — R7309 Other abnormal glucose: Secondary | ICD-10-CM | POA: Insufficient documentation

## 2014-11-09 DIAGNOSIS — I482 Chronic atrial fibrillation: Secondary | ICD-10-CM | POA: Diagnosis not present

## 2014-11-09 DIAGNOSIS — I517 Cardiomegaly: Secondary | ICD-10-CM | POA: Diagnosis not present

## 2014-11-09 DIAGNOSIS — I10 Essential (primary) hypertension: Secondary | ICD-10-CM | POA: Diagnosis not present

## 2014-11-09 DIAGNOSIS — K810 Acute cholecystitis: Secondary | ICD-10-CM | POA: Diagnosis not present

## 2014-11-09 HISTORY — DX: Gastro-esophageal reflux disease without esophagitis: K21.9

## 2014-11-09 LAB — CBC
HCT: 39.7 % (ref 39.0–52.0)
HEMOGLOBIN: 13.2 g/dL (ref 13.0–17.0)
MCH: 28.3 pg (ref 26.0–34.0)
MCHC: 33.2 g/dL (ref 30.0–36.0)
MCV: 85 fL (ref 78.0–100.0)
PLATELETS: 159 10*3/uL (ref 150–400)
RBC: 4.67 MIL/uL (ref 4.22–5.81)
RDW: 15.3 % (ref 11.5–15.5)
WBC: 6.6 10*3/uL (ref 4.0–10.5)

## 2014-11-09 LAB — PROTIME-INR
INR: 2.28 — AB (ref 0.00–1.49)
PROTHROMBIN TIME: 25.3 s — AB (ref 11.6–15.2)

## 2014-11-09 LAB — BASIC METABOLIC PANEL
Anion gap: 10 (ref 5–15)
BUN: 6 mg/dL (ref 6–23)
CO2: 30 mmol/L (ref 19–32)
Calcium: 9 mg/dL (ref 8.4–10.5)
Chloride: 99 mmol/L (ref 96–112)
Creatinine, Ser: 0.98 mg/dL (ref 0.50–1.35)
GFR calc non Af Amer: 77 mL/min — ABNORMAL LOW (ref >90)
GFR, EST AFRICAN AMERICAN: 89 mL/min — AB (ref >90)
Glucose, Bld: 156 mg/dL — ABNORMAL HIGH (ref 70–99)
Potassium: 2.9 mmol/L — ABNORMAL LOW (ref 3.5–5.1)
SODIUM: 139 mmol/L (ref 135–145)

## 2014-11-09 LAB — APTT: APTT: 56 s — AB (ref 24–37)

## 2014-11-09 NOTE — Progress Notes (Signed)
Quick Note:  Pre-operative chest x-ray is acceptable for scheduled surgery.  Vittorio Mohs M. Adrienne Trombetta, MD, FACS [facility] Office: [phone]   ______ 

## 2014-11-10 ENCOUNTER — Encounter (HOSPITAL_COMMUNITY): Payer: Self-pay

## 2014-11-10 LAB — HEMOGLOBIN A1C
HEMOGLOBIN A1C: 6.2 % — AB (ref 4.8–5.6)
Mean Plasma Glucose: 131 mg/dL

## 2014-11-10 NOTE — Progress Notes (Addendum)
Anesthesia Chart Review:  Patient is a 81 year old male scheduled for laparoscopic cholecystectomy on 11/16/14 by Dr. Harlow Asa. He was admitted on 09/02/14 with acute cholecystitis s/p cholecystostomy 09/04/14. He was also treated for acute on chronic diastolic CHF.    Other history includes former smoker, gout, arthritis, HTN, anemia, chronic afib s/p multiple cardioversions, OSA on CPAP, GERD, borderline DM2, hemicolectomy '97. PCP is Dr. Gaynelle Arabian. Cardiologist is Dr. Fransico Him.  She felt he was stable from a cardiac standpoint for this surgery and gave permission to hold Xarelto 48 hours prior to surgery.    Meds include Lumigan, doxazosin, folic acid, Lasix, Toprol XL, KCl (no longer taking as of 11/06/14 RX notation), Xarelto, Ultram.  He saw pulmonologist Dr. Melvyn Novas on 10/01/14.  His note indicates: - PFTs 09/11/14 FEV1 2.32 (78%) ratio 86 p 12% improvement from saba and dlco 76% done w/in a week of admit with cholecystitis  - 09/28/2014 Walked RA x 2 laps @ 185 ft each stopped due to heart rate 175 min sob, nl pace  - CXR 09/28/14 almost complete resolution of R base changes confirmed to be assoc with R effusion/ compressive atx by CT abd 09/04/14 - He does not appear to have any sign pulmonary limitation and certainly does not have sign copd/ ILD so pulmonary f/u is prn.   09/02/14 EKG (done in the setting of acute cholecystitis with leukocytosis): Afib with RVR, ST depression, probably rate related. Baseline wanderer. He has been instructed to take his b-blocker on the day of surgery.  08/24/14 nuclear stress test: Overall Impression: Normal stress nuclear study. There is no scar or ischemia. This is a low risk scan. There are no significant abnormalities. There is no significant change since the report of the study from January, 2013. LV Ejection Fraction: 56%. LV Wall Motion: Normal Wall Motion.  11/23/10 Echo: LVEF 56.5%. Moderate LAE. Mild MR. AV is sclerotic but opens well. Mild  TR. Mild pulmonic insufficiency.  11/09/14 CXR: No active cardiopulmonary disease.  Preoperative labs noted.  K+ 2.9.  Glucose 156. CBC WNL.  PT/INR 25.3/2.28. PTT 56.  A1C 6.2.  I routed BMET to Dr. Harlow Asa inquiring if he had/was addressing hypokalemia. (I also called results to triage nurse Tonya on 11/11/14 at 3/12 PM.)  I am ordering an ISTAT4 and repeat PT/PTT to be done on arrival.  George Hugh [facility] Short Stay Center/Anesthesiology Phone [phone] 11/10/2014 5:17 PM

## 2014-11-13 DIAGNOSIS — E538 Deficiency of other specified B group vitamins: Secondary | ICD-10-CM | POA: Diagnosis not present

## 2014-11-15 MED ORDER — CEFAZOLIN SODIUM-DEXTROSE 2-3 GM-% IV SOLR
2.0000 g | INTRAVENOUS | Status: AC
  Administered 2014-11-16: 2 g via INTRAVENOUS
  Filled 2014-11-15: qty 50

## 2014-11-16 ENCOUNTER — Encounter (HOSPITAL_COMMUNITY)
Admission: RE | Disposition: A | Discharge status: Home / Self Care | Payer: Self-pay | Source: Ambulatory Visit | Attending: Surgery

## 2014-11-16 ENCOUNTER — Inpatient Hospital Stay (HOSPITAL_COMMUNITY)
Admission: RE | Admit: 2014-11-16 | Discharge: 2014-11-20 | DRG: 410 | Disposition: A | Discharge status: Home / Self Care | Payer: Medicare Other | Source: Ambulatory Visit | Attending: Surgery | Admitting: Surgery

## 2014-11-16 ENCOUNTER — Ambulatory Visit (HOSPITAL_COMMUNITY): Payer: Medicare Other | Admitting: Anesthesiology

## 2014-11-16 ENCOUNTER — Encounter (HOSPITAL_COMMUNITY): Payer: Self-pay | Admitting: Surgery

## 2014-11-16 ENCOUNTER — Ambulatory Visit (HOSPITAL_COMMUNITY): Payer: Medicare Other | Admitting: Vascular Surgery

## 2014-11-16 DIAGNOSIS — K8020 Calculus of gallbladder without cholecystitis without obstruction: Secondary | ICD-10-CM | POA: Diagnosis not present

## 2014-11-16 DIAGNOSIS — K8000 Calculus of gallbladder with acute cholecystitis without obstruction: Secondary | ICD-10-CM | POA: Diagnosis present

## 2014-11-16 DIAGNOSIS — Z9889 Other specified postprocedural states: Secondary | ICD-10-CM | POA: Diagnosis not present

## 2014-11-16 DIAGNOSIS — Z86010 Personal history of colonic polyps: Secondary | ICD-10-CM | POA: Diagnosis not present

## 2014-11-16 DIAGNOSIS — M1990 Unspecified osteoarthritis, unspecified site: Secondary | ICD-10-CM | POA: Diagnosis present

## 2014-11-16 DIAGNOSIS — K219 Gastro-esophageal reflux disease without esophagitis: Secondary | ICD-10-CM | POA: Diagnosis present

## 2014-11-16 DIAGNOSIS — K8062 Calculus of gallbladder and bile duct with acute cholecystitis without obstruction: Secondary | ICD-10-CM | POA: Diagnosis not present

## 2014-11-16 DIAGNOSIS — K819 Cholecystitis, unspecified: Secondary | ICD-10-CM | POA: Diagnosis not present

## 2014-11-16 DIAGNOSIS — Z885 Allergy status to narcotic agent status: Secondary | ICD-10-CM

## 2014-11-16 DIAGNOSIS — M545 Low back pain: Secondary | ICD-10-CM | POA: Diagnosis not present

## 2014-11-16 DIAGNOSIS — G4730 Sleep apnea, unspecified: Secondary | ICD-10-CM | POA: Diagnosis present

## 2014-11-16 DIAGNOSIS — K810 Acute cholecystitis: Secondary | ICD-10-CM | POA: Diagnosis present

## 2014-11-16 DIAGNOSIS — K8050 Calculus of bile duct without cholangitis or cholecystitis without obstruction: Secondary | ICD-10-CM | POA: Diagnosis present

## 2014-11-16 DIAGNOSIS — K660 Peritoneal adhesions (postprocedural) (postinfection): Secondary | ICD-10-CM | POA: Diagnosis present

## 2014-11-16 DIAGNOSIS — Z79899 Other long term (current) drug therapy: Secondary | ICD-10-CM | POA: Diagnosis not present

## 2014-11-16 DIAGNOSIS — I4891 Unspecified atrial fibrillation: Secondary | ICD-10-CM | POA: Diagnosis present

## 2014-11-16 DIAGNOSIS — Z7901 Long term (current) use of anticoagulants: Secondary | ICD-10-CM | POA: Diagnosis not present

## 2014-11-16 DIAGNOSIS — Z888 Allergy status to other drugs, medicaments and biological substances status: Secondary | ICD-10-CM | POA: Diagnosis not present

## 2014-11-16 HISTORY — PX: CHOLECYSTECTOMY: SHX55

## 2014-11-16 LAB — PROTIME-INR
INR: 1.15 (ref 0.00–1.49)
PROTHROMBIN TIME: 14.8 s (ref 11.6–15.2)

## 2014-11-16 LAB — POCT I-STAT 4, (NA,K, GLUC, HGB,HCT)
Glucose, Bld: 118 mg/dL — ABNORMAL HIGH (ref 70–99)
HCT: 45 % (ref 39.0–52.0)
Hemoglobin: 15.3 g/dL (ref 13.0–17.0)
Potassium: 3.4 mmol/L — ABNORMAL LOW (ref 3.5–5.1)
Sodium: 139 mmol/L (ref 135–145)

## 2014-11-16 LAB — APTT: aPTT: 33 seconds (ref 24–37)

## 2014-11-16 LAB — GLUCOSE, CAPILLARY: GLUCOSE-CAPILLARY: 90 mg/dL (ref 70–99)

## 2014-11-16 SURGERY — LAPAROSCOPIC CHOLECYSTECTOMY WITH INTRAOPERATIVE CHOLANGIOGRAM
Anesthesia: General

## 2014-11-16 MED ORDER — METOPROLOL SUCCINATE ER 25 MG PO TB24
25.0000 mg | ORAL_TABLET | Freq: Every morning | ORAL | Status: DC
  Administered 2014-11-17 – 2014-11-20 (×4): 25 mg via ORAL
  Filled 2014-11-16 (×4): qty 1

## 2014-11-16 MED ORDER — ONDANSETRON HCL 4 MG PO TABS: 4.0000 mg | ORAL_TABLET | Freq: Four times a day (QID) | ORAL | Status: DC | PRN

## 2014-11-16 MED ORDER — ONDANSETRON HCL 4 MG/2ML IJ SOLN: 4.0000 mg | Freq: Four times a day (QID) | INTRAMUSCULAR | Status: DC | PRN

## 2014-11-16 MED ORDER — FENTANYL CITRATE 0.05 MG/ML IJ SOLN
INTRAMUSCULAR | Status: AC
  Filled 2014-11-16: qty 5

## 2014-11-16 MED ORDER — LIDOCAINE HCL 4 % MT SOLN
OROMUCOSAL | Status: DC | PRN
  Administered 2014-11-16: 4 mL via TOPICAL

## 2014-11-16 MED ORDER — HYDROMORPHONE HCL 1 MG/ML IJ SOLN
INTRAMUSCULAR | Status: AC
  Filled 2014-11-16: qty 1

## 2014-11-16 MED ORDER — LATANOPROST 0.005 % OP SOLN
1.0000 [drp] | Freq: Every day | OPHTHALMIC | Status: DC
  Administered 2014-11-16 – 2014-11-19 (×4): 1 [drp] via OPHTHALMIC
  Filled 2014-11-16: qty 2.5

## 2014-11-16 MED ORDER — OXYCODONE HCL 5 MG/5ML PO SOLN: 5.0000 mg | Freq: Once | ORAL | Status: DC | PRN

## 2014-11-16 MED ORDER — LACTATED RINGERS IV SOLN
INTRAVENOUS | Status: DC
  Administered 2014-11-16 (×2): via INTRAVENOUS

## 2014-11-16 MED ORDER — ROCURONIUM BROMIDE 50 MG/5ML IV SOLN
INTRAVENOUS | Status: AC
  Filled 2014-11-16: qty 1

## 2014-11-16 MED ORDER — FENTANYL CITRATE 0.05 MG/ML IJ SOLN
INTRAMUSCULAR | Status: DC | PRN
  Administered 2014-11-16: 100 ug via INTRAVENOUS
  Administered 2014-11-16 (×7): 25 ug via INTRAVENOUS

## 2014-11-16 MED ORDER — PHENYLEPHRINE HCL 10 MG/ML IJ SOLN
INTRAMUSCULAR | Status: AC
  Filled 2014-11-16: qty 1

## 2014-11-16 MED ORDER — GLYCOPYRROLATE 0.2 MG/ML IJ SOLN
INTRAMUSCULAR | Status: DC | PRN
  Administered 2014-11-16: .6 mg via INTRAVENOUS

## 2014-11-16 MED ORDER — LIDOCAINE HCL (CARDIAC) 20 MG/ML IV SOLN
INTRAVENOUS | Status: AC
  Filled 2014-11-16: qty 5

## 2014-11-16 MED ORDER — ACETAMINOPHEN 325 MG PO TABS: 650.0000 mg | ORAL_TABLET | ORAL | Status: DC | PRN

## 2014-11-16 MED ORDER — ROCURONIUM BROMIDE 100 MG/10ML IV SOLN
INTRAVENOUS | Status: DC | PRN
  Administered 2014-11-16 (×2): 10 mg via INTRAVENOUS
  Administered 2014-11-16 (×2): 5 mg via INTRAVENOUS
  Administered 2014-11-16: 30 mg via INTRAVENOUS
  Administered 2014-11-16 (×2): 5 mg via INTRAVENOUS
  Administered 2014-11-16: 10 mg via INTRAVENOUS

## 2014-11-16 MED ORDER — ONDANSETRON HCL 4 MG/2ML IJ SOLN
INTRAMUSCULAR | Status: DC | PRN
  Administered 2014-11-16: 4 mg via INTRAVENOUS

## 2014-11-16 MED ORDER — KCL IN DEXTROSE-NACL 30-5-0.45 MEQ/L-%-% IV SOLN
INTRAVENOUS | Status: DC
  Administered 2014-11-16 – 2014-11-18 (×3): via INTRAVENOUS
  Filled 2014-11-16 (×4): qty 1000

## 2014-11-16 MED ORDER — ACETAMINOPHEN 325 MG PO TABS: 325.0000 mg | ORAL_TABLET | ORAL | Status: DC | PRN

## 2014-11-16 MED ORDER — PROPOFOL 10 MG/ML IV BOLUS
INTRAVENOUS | Status: AC
  Filled 2014-11-16: qty 20

## 2014-11-16 MED ORDER — GLYCOPYRROLATE 0.2 MG/ML IJ SOLN
INTRAMUSCULAR | Status: AC
  Filled 2014-11-16: qty 1

## 2014-11-16 MED ORDER — PHENYLEPHRINE HCL 10 MG/ML IJ SOLN
10.0000 mg | INTRAVENOUS | Status: DC | PRN
  Administered 2014-11-16: 20 ug/min via INTRAVENOUS

## 2014-11-16 MED ORDER — PHENYLEPHRINE 40 MCG/ML (10ML) SYRINGE FOR IV PUSH (FOR BLOOD PRESSURE SUPPORT)
PREFILLED_SYRINGE | INTRAVENOUS | Status: AC
  Filled 2014-11-16: qty 10

## 2014-11-16 MED ORDER — OXYCODONE HCL 5 MG PO TABS: 5.0000 mg | ORAL_TABLET | Freq: Once | ORAL | Status: DC | PRN

## 2014-11-16 MED ORDER — 0.9 % SODIUM CHLORIDE (POUR BTL) OPTIME
TOPICAL | Status: DC | PRN
  Administered 2014-11-16: 300 mL

## 2014-11-16 MED ORDER — GLYCOPYRROLATE 0.2 MG/ML IJ SOLN
INTRAMUSCULAR | Status: AC
  Filled 2014-11-16: qty 2

## 2014-11-16 MED ORDER — PROPRANOLOL HCL 1 MG/ML IV SOLN
INTRAVENOUS | Status: DC | PRN
  Administered 2014-11-16: .25 mg via INTRAVENOUS

## 2014-11-16 MED ORDER — ALBUMIN HUMAN 5 % IV SOLN
INTRAVENOUS | Status: DC | PRN
  Administered 2014-11-16: 16:00:00 via INTRAVENOUS

## 2014-11-16 MED ORDER — ACETAMINOPHEN 160 MG/5ML PO SOLN
325.0000 mg | ORAL | Status: DC | PRN
  Filled 2014-11-16: qty 20.3

## 2014-11-16 MED ORDER — NEOSTIGMINE METHYLSULFATE 10 MG/10ML IV SOLN
INTRAVENOUS | Status: DC | PRN
  Administered 2014-11-16: 4 mg via INTRAVENOUS

## 2014-11-16 MED ORDER — PROPOFOL 10 MG/ML IV BOLUS
INTRAVENOUS | Status: DC | PRN
  Administered 2014-11-16: 150 mg via INTRAVENOUS
  Administered 2014-11-16 (×2): 10 mg via INTRAVENOUS

## 2014-11-16 MED ORDER — PHENYLEPHRINE HCL 10 MG/ML IJ SOLN
INTRAMUSCULAR | Status: DC | PRN
  Administered 2014-11-16: 80 ug via INTRAVENOUS
  Administered 2014-11-16 (×5): 40 ug via INTRAVENOUS
  Administered 2014-11-16 (×2): 80 ug via INTRAVENOUS
  Administered 2014-11-16: 40 ug via INTRAVENOUS
  Administered 2014-11-16: 80 ug via INTRAVENOUS

## 2014-11-16 MED ORDER — ONDANSETRON HCL 4 MG/2ML IJ SOLN
INTRAMUSCULAR | Status: AC
  Filled 2014-11-16: qty 2

## 2014-11-16 MED ORDER — BUPIVACAINE-EPINEPHRINE (PF) 0.25% -1:200000 IJ SOLN
INTRAMUSCULAR | Status: AC
  Filled 2014-11-16: qty 30

## 2014-11-16 MED ORDER — NEOSTIGMINE METHYLSULFATE 10 MG/10ML IV SOLN
INTRAVENOUS | Status: AC
  Filled 2014-11-16: qty 1

## 2014-11-16 MED ORDER — MIDAZOLAM HCL 2 MG/2ML IJ SOLN
INTRAMUSCULAR | Status: AC
  Filled 2014-11-16: qty 2

## 2014-11-16 MED ORDER — LIDOCAINE HCL (CARDIAC) 20 MG/ML IV SOLN
INTRAVENOUS | Status: DC | PRN
  Administered 2014-11-16: 100 mg via INTRAVENOUS

## 2014-11-16 MED ORDER — HYDROCODONE-ACETAMINOPHEN 5-325 MG PO TABS
1.0000 | ORAL_TABLET | ORAL | Status: DC | PRN
  Administered 2014-11-17 – 2014-11-20 (×8): 2 via ORAL
  Filled 2014-11-16 (×8): qty 2

## 2014-11-16 MED ORDER — HYDROMORPHONE HCL 1 MG/ML IJ SOLN
0.2500 mg | INTRAMUSCULAR | Status: DC | PRN
  Administered 2014-11-16 (×2): 0.5 mg via INTRAVENOUS

## 2014-11-16 MED ORDER — HYDROMORPHONE HCL 1 MG/ML IJ SOLN
1.0000 mg | INTRAMUSCULAR | Status: DC | PRN
  Administered 2014-11-17 (×3): 1 mg via INTRAVENOUS
  Filled 2014-11-16 (×3): qty 1

## 2014-11-16 MED ORDER — DOXAZOSIN MESYLATE 4 MG PO TABS
4.0000 mg | ORAL_TABLET | Freq: Every day | ORAL | Status: DC
  Administered 2014-11-17 – 2014-11-20 (×4): 4 mg via ORAL
  Filled 2014-11-16 (×4): qty 1

## 2014-11-16 SURGICAL SUPPLY — 51 items
APPLIER CLIP ROT 10 11.4 M/L (STAPLE) ×3
BENZOIN TINCTURE PRP APPL 2/3 (GAUZE/BANDAGES/DRESSINGS) ×3 IMPLANT
BLADE SURG CLIPPER 3M 9600 (MISCELLANEOUS) IMPLANT
CANISTER SUCTION 2500CC (MISCELLANEOUS) ×3 IMPLANT
CHLORAPREP W/TINT 26ML (MISCELLANEOUS) ×3 IMPLANT
CLIP APPLIE ROT 10 11.4 M/L (STAPLE) ×1 IMPLANT
COVER MAYO STAND STRL (DRAPES) ×3 IMPLANT
COVER SURGICAL LIGHT HANDLE (MISCELLANEOUS) ×3 IMPLANT
DRAPE C-ARM 42X72 X-RAY (DRAPES) ×3 IMPLANT
DRAPE LAPAROSCOPIC ABDOMINAL (DRAPES) ×3 IMPLANT
ELECT BLADE 4.0 EZ CLEAN MEGAD (MISCELLANEOUS) ×3
ELECT REM PT RETURN 9FT ADLT (ELECTROSURGICAL) ×3
ELECTRODE BLDE 4.0 EZ CLN MEGD (MISCELLANEOUS) ×1 IMPLANT
ELECTRODE REM PT RTRN 9FT ADLT (ELECTROSURGICAL) ×1 IMPLANT
GAUZE SPONGE 2X2 8PLY STRL LF (GAUZE/BANDAGES/DRESSINGS) ×1 IMPLANT
GLOVE SURG ORTHO 8.0 STRL STRW (GLOVE) ×3 IMPLANT
GOWN STRL REUS W/ TWL LRG LVL3 (GOWN DISPOSABLE) ×2 IMPLANT
GOWN STRL REUS W/ TWL XL LVL3 (GOWN DISPOSABLE) ×1 IMPLANT
GOWN STRL REUS W/TWL LRG LVL3 (GOWN DISPOSABLE) ×4
GOWN STRL REUS W/TWL XL LVL3 (GOWN DISPOSABLE) ×2
KIT BASIN OR (CUSTOM PROCEDURE TRAY) ×3 IMPLANT
KIT ROOM TURNOVER OR (KITS) ×3 IMPLANT
NS IRRIG 1000ML POUR BTL (IV SOLUTION) ×3 IMPLANT
PAD ARMBOARD 7.5X6 YLW CONV (MISCELLANEOUS) ×3 IMPLANT
PENCIL BUTTON HOLSTER BLD 10FT (ELECTRODE) ×3 IMPLANT
POUCH SPECIMEN RETRIEVAL 10MM (ENDOMECHANICALS) IMPLANT
RELOAD STAPLER LINE PROX 30 GR (STAPLE) ×1 IMPLANT
SCISSORS LAP 5X35 DISP (ENDOMECHANICALS) ×3 IMPLANT
SET CHOLANGIOGRAPH 5 50 .035 (SET/KITS/TRAYS/PACK) ×3 IMPLANT
SET IRRIG TUBING LAPAROSCOPIC (IRRIGATION / IRRIGATOR) ×3 IMPLANT
SLEEVE ENDOPATH XCEL 5M (ENDOMECHANICALS) ×3 IMPLANT
SPECIMEN JAR SMALL (MISCELLANEOUS) ×3 IMPLANT
SPONGE GAUZE 2X2 STER 10/PKG (GAUZE/BANDAGES/DRESSINGS) ×2
SPONGE GAUZE 4X4 12PLY STER LF (GAUZE/BANDAGES/DRESSINGS) ×3 IMPLANT
SPONGE LAP 18X18 X RAY DECT (DISPOSABLE) ×9 IMPLANT
STAPLER RELOAD LINE PROX 30 GR (STAPLE) ×3
STAPLER VISISTAT 35W (STAPLE) ×3 IMPLANT
SUT MNCRL AB 4-0 PS2 18 (SUTURE) ×3 IMPLANT
SUT NOVA T20/GS 25 (SUTURE) ×6 IMPLANT
SUT SILK 3 0 SH 30 (SUTURE) ×3 IMPLANT
SUT SILK 3 0 SH CR/8 (SUTURE) ×6 IMPLANT
TAPE CLOTH SURG 6X10 WHT LF (GAUZE/BANDAGES/DRESSINGS) ×3 IMPLANT
TOWEL OR 17X24 6PK STRL BLUE (TOWEL DISPOSABLE) ×3 IMPLANT
TOWEL OR 17X26 10 PK STRL BLUE (TOWEL DISPOSABLE) ×3 IMPLANT
TRAY LAPAROSCOPIC (CUSTOM PROCEDURE TRAY) ×3 IMPLANT
TROCAR XCEL BLUNT TIP 100MML (ENDOMECHANICALS) ×3 IMPLANT
TROCAR XCEL NON-BLD 11X100MML (ENDOMECHANICALS) ×3 IMPLANT
TROCAR XCEL NON-BLD 5MMX100MML (ENDOMECHANICALS) ×3 IMPLANT
TUBING BULK SUCTION (MISCELLANEOUS) ×3 IMPLANT
TUBING INSUFFLATION (TUBING) ×3 IMPLANT
YANKAUER SUCT BULB TIP NO VENT (SUCTIONS) ×3 IMPLANT

## 2014-11-16 NOTE — Anesthesia Postprocedure Evaluation (Signed)
  Anesthesia Post-op Note  Patient: Cameron [name]  Procedure(s) Performed: Procedure(s): ATTEMPTED LAPAROSCOPIC CHOLECYSTECTOMY TO OPEN CHOLECYSTECTOMY WITH LYSIS OF ADHESIONS - 30MINUTES (N/A)  Patient Location: PACU  Anesthesia Type:General  Level of Consciousness: awake  Airway and Oxygen Therapy: Patient Spontanous Breathing  Post-op Pain: mild  Post-op Assessment: Post-op Vital signs reviewed  Post-op Vital Signs: Reviewed  Last Vitals:  Filed Vitals:   11/16/14 1845  BP: 116/68  Pulse: 56  Temp:   Resp: 18    Complications: No apparent anesthesia complications

## 2014-11-16 NOTE — Transfer of Care (Signed)
Immediate Anesthesia Transfer of Care Note  Patient: Cameron [name]  Procedure(s) Performed: Procedure(s): ATTEMPTED LAPAROSCOPIC CHOLECYSTECTOMY TO OPEN CHOLECYSTECTOMY WITH LYSIS OF ADHESIONS - 30MINUTES (N/A)  Patient Location: PACU  Anesthesia Type:General  Level of Consciousness: awake, alert  and oriented  Airway & Oxygen Therapy: Patient Spontanous Breathing and Patient connected to nasal cannula oxygen  Post-op Assessment: Report given to RN and Post -op Vital signs reviewed and stable  Post vital signs: Reviewed and stable  Last Vitals:  Filed Vitals:   11/16/14 1250  BP: 169/86  Pulse: 104  Temp: 36.7 C  Resp: 20    Complications: No apparent anesthesia complications

## 2014-11-16 NOTE — Anesthesia Preprocedure Evaluation (Addendum)
Anesthesia Evaluation  Patient identified by MRN, date of birth, ID band Patient awake    Reviewed: Allergy & Precautions, NPO status , Patient's Chart, lab work & pertinent test results, reviewed documented beta blocker date and time   History of Anesthesia Complications Negative for: history of anesthetic complications  Airway Mallampati: III  TM Distance: >3 FB Neck ROM: Full    Dental  (+) Edentulous Upper, Edentulous Lower   Pulmonary sleep apnea and Continuous Positive Airway Pressure Ventilation , former smoker,  breath sounds clear to auscultation- rhonchi        Cardiovascular hypertension, Pt. on medications and Pt. on home beta blockers +CHF + dysrhythmias Atrial Fibrillation Rhythm:Irregular  08/24/14 nuclear stress test: Overall Impression: Normal stress nuclear study. There is no scar or ischemia. This is a low risk scan. There are no significant abnormalities. There is no significant change since the report of the study from January, 2013. LV Ejection Fraction: 56%. LV Wall Motion: Normal Wall Motion.   Neuro/Psych  Neuromuscular disease    GI/Hepatic Neg liver ROS, GERD-  ,  Endo/Other  diabetes  Renal/GU negative Renal ROS     Musculoskeletal  (+) Arthritis -,   Abdominal   Peds  Hematology   Anesthesia Other Findings   Reproductive/Obstetrics                           Anesthesia Physical Anesthesia Plan  ASA: III  Anesthesia Plan: General   Post-op Pain Management:    Induction: Intravenous  Airway Management Planned: Oral ETT  Additional Equipment: None  Intra-op Plan:   Post-operative Plan: Extubation in OR  Informed Consent: I have reviewed the patients History and Physical, chart, labs and discussed the procedure including the risks, benefits and alternatives for the proposed anesthesia with the patient or authorized representative who has indicated his/her  understanding and acceptance.   Dental advisory given  Plan Discussed with: CRNA and Surgeon  Anesthesia Plan Comments:         Anesthesia Quick Evaluation

## 2014-11-16 NOTE — Anesthesia Procedure Notes (Signed)
Procedure Name: Intubation Date/Time: 11/16/2014 3:18 PM Performed by: RAVENEL, BRITNEY Pre-anesthesia Checklist: Patient identified, Emergency Drugs available, Suction available and Patient being monitored Patient Re-evaluated:Patient Re-evaluated prior to inductionOxygen Delivery Method: Circle system utilized Preoxygenation: Pre-oxygenation with 100% oxygen Intubation Type: IV induction Ventilation: Mask ventilation without difficulty Laryngoscope Size: Mac and 4 Grade View: Grade I Tube type: Oral Tube size: 7.5 mm Number of attempts: 1 Airway Equipment and Method: Stylet and LTA kit utilized Placement Confirmation: ETT inserted through vocal cords under direct vision,  breath sounds checked- equal and bilateral,  positive ETCO2 and CO2 detector Secured at: 22 cm Tube secured with: Tape Dental Injury: Teeth and Oropharynx as per pre-operative assessment      

## 2014-11-16 NOTE — H&P (Signed)
General Surgery [facility]  Cameron [name] DOB: [birth year] Widowed / Language: Cleophus Molt / Race: White [name]  History of Present Illness Patient words: GB Drain.  The patient is a 81 year old [name] who presents for drain removal. Patient returns for follow-up. He underwent placement of a percutaneous cholecystostomy tube for acute cholecystitis on September 04, 2014. He went home from the hospital 4 days later. He still has the percutaneous cholecystostomy tube in place. He is flushing this a few times a day. He is seen minimal drainage. He is having no other symptoms. He presents today for evaluation and recommendations. He is anticipating cholecystectomy in the near future.   Other Problems  Arthritis Atrial Fibrillation Cholelithiasis Gastroesophageal Reflux Disease High blood pressure Inguinal Hernia Sleep Apnea  Past Surgical History  Appendectomy Cataract Surgery Bilateral. Colon Polyp Removal - Colonoscopy Colon Removal - Partial Gallbladder Surgery - Open Open Inguinal Hernia Surgery Bilateral. Oral Surgery  Diagnostic Studies History  Colonoscopy within last year  Allergies  Codeine and Related ACE Inhibitors  Medication History  Doxazosin Mesylate (4MG  Tablet, Oral) Active. Folic Acid (1MG  Tablet, Oral) Active. Furosemide (40MG  Tablet, Oral) Active. Lumigan (0.01% Solution, Ophthalmic) Active. Metoprolol Succinate ER (25MG  Tablet ER 24HR, Oral) Active. Cyanocobalamin (1000MCG/ML Solution, Injection) Active. Mobic (15MG  Tablet, Oral) Active. Xarelto (20MG  Tablet, Oral) Active. Ultram (50MG  Tablet, Oral) Active.  Social History Alcohol use Occasional alcohol use. Caffeine use Coffee, Tea. No drug use Tobacco use Former smoker.  Family History Respiratory Condition Daughter.  Review of Systems General Not Present- Appetite Loss, Chills, Fatigue, Fever, Night Sweats, Weight Gain and Weight  Loss. Skin Not Present- Change in Wart/Mole, Dryness, Hives, Jaundice, New Lesions, Non-Healing Wounds, Rash and Ulcer. HEENT Present- Wears glasses/contact lenses. Not Present- Earache, Hearing Loss, Hoarseness, Nose Bleed, Oral Ulcers, Ringing in the Ears, Seasonal Allergies, Sinus Pain, Sore Throat, Visual Disturbances and Yellow Eyes. Respiratory Not Present- Bloody sputum, Chronic Cough, Difficulty Breathing, Snoring and Wheezing. Cardiovascular Present- Rapid Heart Rate and Swelling of Extremities. Not Present- Chest Pain, Difficulty Breathing Lying Down, Leg Cramps, Palpitations and Shortness of Breath. Gastrointestinal Not Present- Abdominal Pain, Bloating, Bloody Stool, Change in Bowel Habits, Chronic diarrhea, Constipation, Difficulty Swallowing, Excessive gas, Gets full quickly at meals, Hemorrhoids, Indigestion, Nausea, Rectal Pain and Vomiting. [name] Genitourinary Not Present- Blood in Urine, Change in Urinary Stream, Frequency, Impotence, Nocturia, Painful Urination, Urgency and Urine Leakage. Musculoskeletal Present- Joint Pain and Swelling of Extremities. Not Present- Back Pain, Joint Stiffness, Muscle Pain and Muscle Weakness. Neurological Present- Numbness and Tingling. Not Present- Decreased Memory, Fainting, Headaches, Seizures, Tremor, Trouble walking and Weakness. Psychiatric Not Present- Anxiety, Bipolar, Change in Sleep Pattern, Depression, Fearful and Frequent crying. Endocrine Not Present- Cold Intolerance, Excessive Hunger, Hair Changes, Heat Intolerance, Hot flashes and New Diabetes. Hematology Present- Easy Bruising. Not Present- Excessive bleeding, Gland problems, HIV and Persistent Infections.   Vitals  09/30/2014 2:24 PM Weight: 197 lb Height: 70in Body Surface Area: 2.1 m Body Mass Index: 28.Cameron kg/m Temp.: 97.5F(Temporal)  Pulse: 74 (Regular)  Resp.: 18 (Unlabored)  BP: 140/88 (Sitting, Left Arm, Standard)    Physical Exam  HEENT - clear,  normocephalic Neck - soft, non-tender; no lymphadenopathy Chest - clear bilat BS Cor - irreg, rate controlled Abd - soft without distention. Percutaneous drain is in place at the right costal margin. Dressings are dry. Minimal output present within the drainage bag. No abdominal tenderness. Ext - minimal edema, non-tender   Assessment & Plan ACUTE CHOLECYSTITIS (  575.0  K81.0)  Patient has indwelling perc drain from cholecystostomy.  Post procedure study results noted in medical record.  Now for lap chole with removal of perc cholecystostomy tube.  Cardiac and medical clearances obtained.  The risks and benefits of the procedure have been discussed at length with the patient.  The patient understands the proposed procedure, potential alternative treatments, and the course of recovery to be expected.  All of the patient's questions have been answered at this time.  The patient wishes to proceed with surgery.  Earnstine Regal, MD, [facility] Office: [phone]

## 2014-11-16 NOTE — Brief Op Note (Signed)
11/16/2014  6:17 PM  PATIENT:  Cameron [name]  81 y.o. male  PRE-OPERATIVE DIAGNOSIS:  cholecystitis with perc drain  POST-OPERATIVE DIAGNOSIS:  same   PROCEDURE:  Procedure(s): ATTEMPTED LAPAROSCOPIC CHOLECYSTECTOMY TO OPEN CHOLECYSTECTOMY WITH LYSIS OF ADHESIONS - 30MINUTES (N/A)  SURGEON:  Surgeon(s) and Role:    * Armandina Gemma, MD - Primary    * Gayland Curry, MD - Assisting  ASSISTANTS: Sharyn Dross, RNFA   ANESTHESIA:   general  EBL:  Total I/O In: 1250 [I.V.:1000; IV Piggyback:250] Out: 300 [Blood:300]  BLOOD ADMINISTERED:none  DRAINS: (19Fr) Blake drain(s) in the gallbladder bed   LOCAL MEDICATIONS USED:  NONE  SPECIMEN:  Excision  DISPOSITION OF SPECIMEN:  PATHOLOGY  COUNTS:  YES  TOURNIQUET:  * No tourniquets in log *  DICTATION: .Other Dictation: Dictation Number [id number]  PLAN OF CARE: Admit to inpatient   PATIENT DISPOSITION:  PACU - hemodynamically stable.   Delay start of Pharmacological VTE agent (>24hrs) due to surgical blood loss or risk of bleeding: yes  Earnstine Regal, MD, [facility] Office: [phone]

## 2014-11-17 ENCOUNTER — Encounter (HOSPITAL_COMMUNITY): Payer: Self-pay | Admitting: Surgery

## 2014-11-17 LAB — COMPREHENSIVE METABOLIC PANEL
ALBUMIN: 2.8 g/dL — AB (ref 3.5–5.2)
ALK PHOS: 77 U/L (ref 39–117)
ALT: 21 U/L (ref 0–53)
ANION GAP: 11 (ref 5–15)
AST: 31 U/L (ref 0–37)
BUN: 14 mg/dL (ref 6–23)
CO2: 25 mmol/L (ref 19–32)
Calcium: 8.1 mg/dL — ABNORMAL LOW (ref 8.4–10.5)
Chloride: 100 mmol/L (ref 96–112)
Creatinine, Ser: 1.03 mg/dL (ref 0.50–1.35)
GFR calc non Af Amer: 67 mL/min — ABNORMAL LOW (ref >90)
GFR, EST AFRICAN AMERICAN: 78 mL/min — AB (ref >90)
Glucose, Bld: 146 mg/dL — ABNORMAL HIGH (ref 70–99)
Potassium: 4.3 mmol/L (ref 3.5–5.1)
Sodium: 136 mmol/L (ref 135–145)
Total Bilirubin: 0.9 mg/dL (ref 0.3–1.2)
Total Protein: 6 g/dL (ref 6.0–8.3)

## 2014-11-17 LAB — CBC
HCT: 34.2 % — ABNORMAL LOW (ref 39.0–52.0)
HEMOGLOBIN: 11 g/dL — AB (ref 13.0–17.0)
MCH: 27.6 pg (ref 26.0–34.0)
MCHC: 32.2 g/dL (ref 30.0–36.0)
MCV: 85.7 fL (ref 78.0–100.0)
Platelets: 215 10*3/uL (ref 150–400)
RBC: 3.99 MIL/uL — ABNORMAL LOW (ref 4.22–5.81)
RDW: 15 % (ref 11.5–15.5)
WBC: 13.2 10*3/uL — ABNORMAL HIGH (ref 4.0–10.5)

## 2014-11-17 NOTE — Op Note (Signed)
NAME:  Cameron [name], Cameron [name] NO.:  [id number]  MEDICAL RECORD NO.:  [id number]  LOCATION:  6N31C                        FACILITY:  [facility]  PHYSICIAN:  Earnstine Regal, MD      DATE OF BIRTH:  October 01, 1934  DATE OF PROCEDURE:  11/16/2014                              OPERATIVE REPORT   PREOPERATIVE DIAGNOSES:  Acute cholecystitis, cholelithiasis, presence of percutaneous cholecystostomy drain.  POSTOPERATIVE DIAGNOSES:  Acute cholecystitis, cholelithiasis, presence of percutaneous cholecystostomy drain.  PROCEDURE: 1. Attempted laparoscopy. 2. Open cholecystectomy. 3. Lysis of adhesions (30 minutes).  SURGEON:  Armandina Gemma, MD, FACS  ASSISTANTS:  Greer Pickerel MD, FACS and Sharyn Dross, RNFA.  ESTIMATED BLOOD LOSS:  100 mL.  PREPARATION:  ChloraPrep.  COMPLICATIONS:  None.  INDICATIONS:  The patient is a 81 year old male who was hospitalized with acute cholecystitis in December 2015.  He underwent percutaneous drainage and was treated with antibiotics with resolution of his acute cholecystitis.  The patient was followed in the office and underwent cholangiography via the percutaneous cholecystostomy tube showing a patent cystic duct.  The patient did not tolerate clamping of the cholecystostomy tube.  He now comes to the operating room for cholecystectomy.  BODY OF REPORT:  Procedure was done in OR #2 at the [place].  The patient was brought to the operating room, placed in supine position on the operating room table.  Following administration of general anesthesia, the patient was positioned and then prepped and draped in the usual aseptic fashion.  After ascertaining that an adequate level of anesthesia had been achieved, an infraumbilical incision was made with a #15 blade.  Dissection was carried down to the fascia.  Fascia was incised in the midline and the peritoneal cavity was entered cautiously.  A Hasson cannula was introduced  under direct vision and secured with a purse-string suture. Laparoscope was introduced.  There were multiple adhesions in the lower abdomen.  There were loops of small bowel near the umbilical port site. There does not appear to be any complication from port insertion. However, it is impossible to visualize the right upper quadrant as the omentum and colon were densely adherent to the anterior abdominal wall and there were multiple loops of small bowel and omentum adherent to the midline of the abdomen where the patient has had a previous midline laparotomy incision.  The left upper quadrant was visualized and a 5-mm trocar was placed under direct vision.  A 5-mm scope was inserted in the left upper quadrant bed again visualization of the midline and right upper quadrant was not possible.  At this point, a decision was made to discontinue laparoscopy and proceed with open laparotomy.  Pneumoperitoneum was released.  A right subcostal incision was made with a #10 blade.  Dissection was carried through subcutaneous tissues and hemostasis achieved with electrocautery.  Fascia was incised.  The rectus abdominis muscle on the right was divided with the electrocautery.  Posterior fascia was incised.  Peritoneum was incised sharply, and the peritoneal cavity was entered cautiously.  There were significant adhesions of the omentum to the anterior abdominal wall. These were taken down with sharp dissection with the Metzenbaum  scissors.  There were multiple loops of small bowel adherent to the anterior abdominal wall and particularly to the site of the midline laparotomy incision.  With some difficulty, these were taken down sharply.  Two areas of serosal tear were created and these were closed with interrupted 3-0 silk simple sutures.  Enterotomy was not made at any point.  Remainder of the loops of small bowel were mobilized from the anterior abdominal wall.  This allowed for visualization of  the right upper quadrant.  A Bookwalter retractor was then placed.  Further dissection allowed for mobilization of the omentum off the anterior abdominal wall, the lateral abdominal wall, and the anterior surface of the liver.  The colon was visualized.  The dome of the gallbladder was finally visualized.  There was chronic significant inflammation with very dense adhesions to the gallbladder.  The omentum and mesocolon were dissected away from the gallbladder sharply and hemostasis achieved with the electrocautery.  The gallbladder was opened, and the percutaneous cholecystostomy tube was visualized.  The gallbladder was then meticulously dissected out slowly using the right angle and sharp dissection until the entire gallbladder wall was visualized and the colon, mesocolon, and duodenum are safely dissected away from the gallbladder.  Gallbladder was then dissected out of the gallbladder bed using the electrocautery for hemostasis.  Gallbladder was mobilized down to the neck.  The neck was skeletonized circumferentially and multiple vascular structures were divided between Ligaclips.  The gallbladder was finally dissected down to the cystic duct and its surrounding tissue which was dense and inflammatory.  It was transected using a TA-30 stapler and then sharply divided.  The visible cystic duct was then suture ligated with a 3-0 silk suture ligature.  Gallbladder was passed off the field.  Cholecystostomy tube was then divided and removed in its entirety. Right upper quadrant was copiously irrigated with warm saline which was evacuated.  Good hemostasis was noted.  A 19-French Blake drain was brought in from right lateral stab wound and placed beneath the liver and across the gallbladder bed and across the cystic duct stump.  Drain was secured to the skin with a 3-0 nylon suture.  Omentum was used to cover the undersurface of the liver.  The abdominal wall was closed in 2 layers with  running #1 Novafil sutures.  Subcutaneous tissues were irrigated.  Ports were removed.  The 0 Vicryl pursestring suture at the umbilicus was tied securely.  Skin incisions were closed with stainless steel staples.  Drain was placed to bulb suction.  Wounds were washed and dried and sterile gauze dressings were applied.  The patient was awakened from anesthesia and brought to the recovery room.  The patient tolerated the procedure well.   Earnstine Regal, MD, [facility] Office: [phone]    TMG/MEDQ  D:  11/16/2014  T:  11/17/2014  Job:  [id number]

## 2014-11-17 NOTE — Progress Notes (Signed)
Patient ID: Cameron [name], male   DOB: 09/08/1934, 81 y.o.   MRN: [id number]  [facility]  POD#: 1  Subjective: Patient with pain on deep inspiration and cough.  Otherwise doing well post op.  Tolerating clear liquids.  Objective: Vital signs in last 24 hours: Temp:  [97.7 F (36.5 C)-99.1 F (37.3 C)] 97.7 F (36.5 C) (02/16 0555) Pulse Rate:  [56-104] 90 (02/16 0555) Resp:  [15-22] 21 (02/16 0555) BP: (89-169)/(53-94) 129/70 mmHg (02/16 0555) SpO2:  [95 %-100 %] 95 % (02/16 0555) Weight:  [198 lb 11.2 oz (90.13 kg)-206 lb 9.6 oz (93.713 kg)] 206 lb 9.6 oz (93.713 kg) (02/15 2030) Last BM Date: 11/16/14  Intake/Output from previous day: 02/15 0701 - 02/16 0700 In: 2690 [I.V.:2350; IV Piggyback:250] Out: 550 [Urine:250; Blood:300] Intake/Output this shift: Total I/O In: 240 [P.O.:240] Out: -   Physical Exam: HEENT - sclerae clear, mucous membranes moist Neck - soft Chest - clear bilaterally Cor - RRR Abdomen - soft, dressings dry and intact; drain with thin serosanguinous; BS active Ext - no edema, non-tender Neuro - alert & oriented, no focal deficits  Lab Results:   Recent Labs  11/16/14 1247 11/17/14 0500  WBC  --  13.2*  HGB 15.3 11.0*  HCT 45.0 34.2*  PLT  --  215   BMET  Recent Labs  11/16/14 1247 11/17/14 0500  NA 139 136  K 3.4* 4.3  CL  --  100  CO2  --  25  GLUCOSE 118* 146*  BUN  --  14  CREATININE  --  1.03  CALCIUM  --  8.1*   PT/INR  Recent Labs  11/16/14 1242  LABPROT 14.8  INR 1.15   Comprehensive Metabolic Panel:    Component Value Date/Time   NA 136 11/17/2014 0500   NA 139 11/16/2014 1247   K 4.3 11/17/2014 0500   K 3.4* 11/16/2014 1247   CL 100 11/17/2014 0500   CL 99 11/09/2014 0853   CO2 25 11/17/2014 0500   CO2 30 11/09/2014 0853   BUN 14 11/17/2014 0500   BUN 6 11/09/2014 0853   CREATININE 1.03 11/17/2014 0500   CREATININE 0.98 11/09/2014 0853   GLUCOSE 146* 11/17/2014  0500   GLUCOSE 118* 11/16/2014 1247   CALCIUM 8.1* 11/17/2014 0500   CALCIUM 9.0 11/09/2014 0853   AST 31 11/17/2014 0500   AST 24 09/05/2014 0330   ALT 21 11/17/2014 0500   ALT 19 09/05/2014 0330   ALKPHOS 77 11/17/2014 0500   ALKPHOS 104 09/05/2014 0330   BILITOT 0.9 11/17/2014 0500   BILITOT 1.2 09/05/2014 0330   PROT 6.0 11/17/2014 0500   PROT 6.5 09/05/2014 0330   ALBUMIN 2.8* 11/17/2014 0500   ALBUMIN 2.3* 09/05/2014 0330    Studies/Results: No results found.  Anti-infectives: Anti-infectives    Start     Dose/Rate Route Frequency Ordered Stop   11/16/14 0600  ceFAZolin (ANCEF) IVPB 2 g/50 mL premix     2 g 100 mL/hr over 30 Minutes Intravenous On call to O.R. 11/15/14 1408 11/16/14 1513      Assessment & Plans: Status post open cholecystectomy, removal of perc drain  Allow full liquids as tolerated  OOB to chair today, encourage IS use  Rx for pain  Earnstine Regal, MD, [facility] Office: [place] 11/17/2014

## 2014-11-18 NOTE — Care Management Note (Signed)
  Page 1 of 1   11/18/2014     3:24:05 PM CARE MANAGEMENT NOTE 11/18/2014  Patient:  Cameron [name], Cameron [name]   Account Number:  [id number]  Date Initiated:  11/18/2014  Documentation initiated by:  Magdalen Spatz  Subjective/Objective Assessment:   open chole; diet up to fulls on hospital day 2     Action/Plan:   await toleration of regular diet for d/c   Anticipated DC Date:  11/19/2014   Anticipated DC Plan:  HOME/SELF CARE         Choice offered to / List presented to:             Status of service:   Medicare Important Message given?  YES (If response is "NO", the following Medicare IM given date fields will be blank) Date Medicare IM given:  11/18/2014 Medicare IM given by:  Magdalen Spatz Date Additional Medicare IM given:   Additional Medicare IM given by:    Discharge Disposition:    Per UR Regulation:  Reviewed for med. necessity/level of care/duration of stay  If discussed at [place] of Stay Meetings, dates discussed:    Comments:  11-18-14 Order to assess home health needs . Paged MD for PT/OT evals . Magdalen Spatz RN BSN [phone]

## 2014-11-18 NOTE — Progress Notes (Signed)
Patient stated that he could place himself on CPAP. RT checked and the CPAP is set on 7 cmH2O with patients home nasal pillows. RT advised patient to call if he needed any other assistance.

## 2014-11-18 NOTE — Progress Notes (Signed)
Patient ID: Cameron [name], male   DOB: 1934-02-05, 81 y.o.   MRN: [id number]  [facility]  POD#: 2  Subjective: Patient up in chair.  Ambulated.  Pain improving.  Taking full liquids - wants regular food.  Objective: Vital signs in last 24 hours: Temp:  [98.2 F (36.8 C)-99 F (37.2 C)] 98.2 F (36.8 C) (02/17 1358) Pulse Rate:  [82-100] 85 (02/17 1358) Resp:  [19-20] 20 (02/17 1358) BP: (118-136)/(65-80) 125/80 mmHg (02/17 1358) SpO2:  [97 %-98 %] 98 % (02/17 1358) Last BM Date: 11/16/14  Intake/Output from previous day: 02/16 0701 - 02/17 0700 In: 2046 [P.O.:1180; I.V.:866] Out: 435 [Urine:400; Drains:35] Intake/Output this shift: Total I/O In: 480 [P.O.:480] Out: 500 [Urine:500]  Physical Exam: HEENT - sclerae clear, mucous membranes moist Neck - soft Chest - clear bilaterally Cor - RRR Abdomen - soft, BS present; dressing dry and intact; JP with thin serosanguinous Ext - no edema, non-tender Neuro - alert & oriented, no focal deficits  Lab Results:   Recent Labs  11/16/14 1247 11/17/14 0500  WBC  --  13.2*  HGB 15.3 11.0*  HCT 45.0 34.2*  PLT  --  215   BMET  Recent Labs  11/16/14 1247 11/17/14 0500  NA 139 136  K 3.4* 4.3  CL  --  100  CO2  --  25  GLUCOSE 118* 146*  BUN  --  14  CREATININE  --  1.03  CALCIUM  --  8.1*   PT/INR  Recent Labs  11/16/14 1242  LABPROT 14.8  INR 1.15   Comprehensive Metabolic Panel:    Component Value Date/Time   NA 136 11/17/2014 0500   NA 139 11/16/2014 1247   K 4.3 11/17/2014 0500   K 3.4* 11/16/2014 1247   CL 100 11/17/2014 0500   CL 99 11/09/2014 0853   CO2 25 11/17/2014 0500   CO2 30 11/09/2014 0853   BUN 14 11/17/2014 0500   BUN 6 11/09/2014 0853   CREATININE 1.03 11/17/2014 0500   CREATININE 0.98 11/09/2014 0853   GLUCOSE 146* 11/17/2014 0500   GLUCOSE 118* 11/16/2014 1247   CALCIUM 8.1* 11/17/2014 0500   CALCIUM 9.0 11/09/2014 0853   AST 31  11/17/2014 0500   AST 24 09/05/2014 0330   ALT 21 11/17/2014 0500   ALT 19 09/05/2014 0330   ALKPHOS 77 11/17/2014 0500   ALKPHOS 104 09/05/2014 0330   BILITOT 0.9 11/17/2014 0500   BILITOT 1.2 09/05/2014 0330   PROT 6.0 11/17/2014 0500   PROT 6.5 09/05/2014 0330   ALBUMIN 2.8* 11/17/2014 0500   ALBUMIN 2.3* 09/05/2014 0330    Studies/Results: No results found.  Anti-infectives: Anti-infectives    Start     Dose/Rate Route Frequency Ordered Stop   11/16/14 0600  ceFAZolin (ANCEF) IVPB 2 g/50 mL premix     2 g 100 mL/hr over 30 Minutes Intravenous On call to O.R. 11/15/14 1408 11/16/14 1513      Assessment & Plans: Status post open cholecystectomy, lysis of adhesions  Advance to regular diet  OOB, ambulate  Discharge planning per case manager and social worker  Anticipate discharge 1-2 days  Earnstine Regal, MD, [facility] Office: [place] 11/18/2014

## 2014-11-18 NOTE — Progress Notes (Signed)
UR completed.  Lasonia Casino, RN BSN [facility] CCM Trauma/Neuro ICU Case Manager [phone]  

## 2014-11-18 NOTE — Progress Notes (Signed)
CPAP machine set up at bedside, 7.0 cm H20 with  Nasal pillows.  Patient states he is able to placed himself on/off as needed.  Patient encouraged to call for assistance if needed.

## 2014-11-18 NOTE — Care Management (Signed)
Medicare IM given. Magdalen Spatz RN BSN

## 2014-11-18 NOTE — Evaluation (Signed)
Physical Therapy Evaluation Patient Details Name: Cameron [name] MRN: [id number] DOB: 02/09/1934 Today's Date: 11/18/2014   History of Present Illness  Patient is a 81 y/o male with PMH of HTN, OSA, neuropathy, chronic diastolic heart failure and A-fib who is s/p open cholecystectomy and lysis of adhesions.  Clinical Impression  Patient presents with functional limitations due to deficits listed in PT problem list (see below). Pt with generalized weakness, impaired endurance and balance deficits impacting safe mobility. Pt will be staying with son for a short period until mobility improves. Declines HHPT. Pt would benefit from skilled PT to improve gait, balance, mobility and stair negotiation to maximize independence prior to return home.    Follow Up Recommendations Supervision/Assistance - 24 hour (pt refusing HHPT)    Equipment Recommendations  None recommended by PT    Recommendations for Other Services       Precautions / Restrictions Precautions Precautions: Fall Precaution Comments: Monitor HR Restrictions Weight Bearing Restrictions: No      Mobility  Bed Mobility               General bed mobility comments: Received sitting in chair upon PT arrival.   Transfers Overall transfer level: Needs assistance Equipment used: Rolling walker (2 wheeled) Transfers: Sit to/from Stand Sit to Stand: Min guard         General transfer comment: Min guard for safety.   Ambulation/Gait Ambulation/Gait assistance: Min guard Ambulation Distance (Feet): 140 Feet Assistive device: Rolling walker (2 wheeled) Gait Pattern/deviations: Step-through pattern;Decreased stride length;Decreased step length - left;Decreased step length - right;Trunk flexed Gait velocity: extremely slow Gait velocity interpretation: Below normal speed for age/gender General Gait Details: Pt with very slow, mildly unsteady gait. HR in A-fib 97-125 bpm during gait.   Stairs             Wheelchair Mobility    Modified Rankin (Stroke Patients Only)       Balance Overall balance assessment: Needs assistance Sitting-balance support: Feet supported;No upper extremity supported Sitting balance-Leahy Scale: Fair     Standing balance support: During functional activity Standing balance-Leahy Scale: Fair Standing balance comment: Tolerated dynamic standing during urination without difficulty or LOB.                             Pertinent Vitals/Pain Pain Assessment: No/denies pain    Home Living Family/patient expects to be discharged to:: Private residence Living Arrangements: Alone;Children (Will be going to stay at his son's home. Below is son's home setup.) Available Help at Discharge: Family;Available 24 hours/day Type of Home: House Home Access: Stairs to enter Entrance Stairs-Rails: Right Entrance Stairs-Number of Steps: 2 Home Layout: Two level;Able to live on main level with bedroom/bathroom Home Equipment: Wheelchair - power;Walker - 2 wheels;Bedside commode;Shower seat;Grab bars - tub/shower;Grab bars - toilet Additional Comments: Pt reports he has his deceased wife's power chair (had MS)    Prior Function Level of Independence: Independent               Hand Dominance        Extremity/Trunk Assessment   Upper Extremity Assessment: Defer to OT evaluation           Lower Extremity Assessment: Generalized weakness         Communication   Communication: No difficulties  Cognition Arousal/Alertness: Awake/alert Behavior During Therapy: WFL for tasks assessed/performed Overall Cognitive Status: Within Functional Limits for tasks assessed  General Comments General comments (skin integrity, edema, etc.): Education provided on bracing when coughing/sneezing to support abdomen.    Exercises        Assessment/Plan    PT Assessment Patient needs continued PT services  PT Diagnosis  Difficulty walking;Generalized weakness   PT Problem List Decreased strength;Decreased activity tolerance;Decreased balance;Decreased mobility  PT Treatment Interventions Balance training;Gait training;Stair training;Functional mobility training;Patient/family education;Therapeutic activities;Therapeutic exercise   PT Goals (Current goals can be found in the Care Plan section) Acute Rehab PT Goals Patient Stated Goal: to return home tomorrow. PT Goal Formulation: With patient Time For Goal Achievement: 12/02/14 Potential to Achieve Goals: Fair    Frequency Min 3X/week   Barriers to discharge        Co-evaluation               End of Session Equipment Utilized During Treatment: Gait belt Activity Tolerance: Patient tolerated treatment well Patient left: in chair;with call bell/phone within reach Nurse Communication: Mobility status    Functional Assessment Tool Used: `    Time: 4540-9811 PT Time Calculation (min) (ACUTE ONLY): 34 min   Charges:   PT Evaluation $Initial PT Evaluation Tier I: 1 Procedure PT Treatments $Gait Training: 8-22 mins   PT G Codes:   PT G-Codes **NOT FOR INPATIENT CLASS** Functional Assessment Tool Used: Neal Dy, [id number] A 11/18/2014, 4:53 PM Candy Sledge, PT, DPT [phone]

## 2014-11-19 MED ORDER — HYDROCODONE-ACETAMINOPHEN 5-325 MG PO TABS: 1.0000 | ORAL_TABLET | ORAL | Status: DC | PRN

## 2014-11-19 MED ORDER — POTASSIUM CHLORIDE CRYS ER 10 MEQ PO TBCR
10.0000 meq | EXTENDED_RELEASE_TABLET | Freq: Two times a day (BID) | ORAL | Status: DC
Start: 2014-11-19 — End: 2014-11-20
  Administered 2014-11-19: 10 meq via ORAL
  Filled 2014-11-19 (×3): qty 1

## 2014-11-19 NOTE — Evaluation (Signed)
Occupational Therapy Evaluation and Discharge Patient Details Name: Cameron [name] MRN: [id number] DOB: [birth year] Today's Date: 11/19/2014    History of Present Illness Patient is a 81 y/o male with PMH of HTN, OSA, neuropathy, chronic diastolic heart failure and A-fib who is s/p open cholecystectomy and lysis of adhesions.   Clinical Impression   This 81 yo male admitted and underwent above presents to acute OT with increased soreness/pain around abdominal incision site causing decreased mobility. He will be going home with family who can provided 24 hour S and prn A per pt. No further OT needs identified, we will sign off.    Follow Up Recommendations  No OT follow up    Equipment Recommendations  None recommended by OT       Precautions / Restrictions Precautions Precautions: Fall Precaution Comments: Monitor HR; abdominal incision Restrictions Weight Bearing Restrictions: No      Mobility Bed Mobility               General bed mobility comments: Pt up in recliner upon my arrival  Transfers Overall transfer level: Needs assistance Equipment used: Rolling walker (2 wheeled) Transfers: Sit to/from Stand Sit to Stand: Supervision                   ADL Overall ADL's : Needs assistance/impaired Eating/Feeding: Independent;Sitting   Grooming: Supervision/safety;Sitting;Standing   Upper Body Bathing: Supervision/ safety;Sitting;Standing   Lower Body Bathing: Supervison/ safety;Sit to/from stand   Upper Body Dressing : Supervision/safety;Sitting;Standing   Lower Body Dressing: Supervision/safety;Sit to/from stand   Toilet Transfer: Supervision/safety;Ambulation;RW (recliner>around unit 6N>back to recliner)   Toileting- Clothing Manipulation and Hygiene: Supervision/safety;Sit to/from stand         General ADL Comments: Pt is aware that he cannot get incision wet until cleared by surgeon and until then he will sponge bathe. Pt reports that he will  wear slip on bedroom shoes, no shoes/socks in house/ or have family A with them until he can do them by himself.               Pertinent Vitals/Pain Pain Assessment: Faces Faces Pain Scale: Hurts little more Pain Location: abdomen Pain Descriptors / Indicators: Sore Pain Intervention(s): Patient requesting pain meds-RN notified     Hand Dominance Right   Extremity/Trunk Assessment Upper Extremity Assessment Upper Extremity Assessment: Overall WFL for tasks assessed           Communication Communication Communication: No difficulties   Cognition Arousal/Alertness: Awake/alert Behavior During Therapy: WFL for tasks assessed/performed Overall Cognitive Status: Within Functional Limits for tasks assessed                                Home Living Family/patient expects to be discharged to:: Private residence Living Arrangements:  (He will be going to stay with son) Available Help at Discharge: Family;Available 24 hours/day Type of Home: House Home Access: Stairs to enter [employer] of Steps: 2 Entrance Stairs-Rails: Right Home Layout: Two level;Able to live on main level with bedroom/bathroom     Bathroom Shower/Tub: Occupational psychologist: Standard     Home Equipment: Wheelchair - power;Walker - 2 wheels;Bedside commode;Shower seat;Grab bars - tub/shower;Grab bars - toilet   Additional Comments: Pt reports he has his deceased wife's power chair (had MS)      Prior Functioning/Environment Level of Independence: Independent  OT Diagnosis: Generalized weakness         OT Goals(Current goals can be found in the care plan section) Acute Rehab OT Goals Patient Stated Goal: home today hopefully  OT Frequency:                End of Session Equipment Utilized During Treatment: Rolling walker Nurse Communication: Patient requests pain meds  Activity Tolerance: Patient tolerated treatment well Patient left:  in chair;with call bell/phone within reach   Time: 9450-3888 OT Time Calculation (min): 43 min Charges:  OT General Charges $OT Visit: 1 Procedure OT Evaluation $Initial OT Evaluation Tier I: 1 Procedure OT Treatments $Self Care/Home Management : 23-37 mins  Almon Register [phone] 11/19/2014, 3:32 PM

## 2014-11-19 NOTE — Progress Notes (Signed)
Pt ambulated with rolling walker half of the unit asymptomatic. Arthor Captain LPN

## 2014-11-19 NOTE — Progress Notes (Signed)
Physical Therapy Treatment Patient Details Name: Cameron [name] MRN: [id number] DOB: 08-Feb-1934 Today's Date: 11/19/2014    History of Present Illness Patient is a 81 y/o male with PMH of HTN, OSA, neuropathy, chronic diastolic heart failure and A-fib who is s/p open cholecystectomy and lysis of adhesions.    PT Comments    Patient progressing well with mobility. Exhibits difficulty with bed mobility requires cues for log roll technique and Mod A. Tolerated stair negotiation today with Min guard assist for safety. Pt will be going home with son and will have necessary help. Would benefit from 1 additional session for bed mobility/transfers prior to return home.   Follow Up Recommendations  Supervision/Assistance - 24 hour     Equipment Recommendations  None recommended by PT    Recommendations for Other Services       Precautions / Restrictions Precautions Precautions: Fall Precaution Comments: Monitor HR Restrictions Weight Bearing Restrictions: No    Mobility  Bed Mobility Overal bed mobility: Needs Assistance Bed Mobility: Rolling;Sidelying to Sit Rolling: Mod assist Sidelying to sit: Mod assist;HOB elevated       General bed mobility comments: Pt will be sleeping in ottoman at home. Attempting to get up supine to sit with much difficulty. Instructed in log roll technique to decrease stress on abdomen. Mod A to roll and elevate trunk. Cues to brace during mobility.  Transfers Overall transfer level: Needs assistance Equipment used: Rolling walker (2 wheeled) Transfers: Sit to/from [employer] Sit to Stand: Min guard Stand pivot transfers: Min guard       General transfer comment: Min guard for safety. Cues for hand placement and pt tends to pull up on RW. Stood from [employer], from chair x1. SPT bed to chair.  Ambulation/Gait Ambulation/Gait assistance: Supervision Ambulation Distance (Feet): 150 Feet Assistive device: Rolling walker (2  wheeled) Gait Pattern/deviations: Step-through pattern;Decreased stride length;Trunk flexed     General Gait Details: Pt with slow and steady gait. HR ranged 98-125 bpm in A-fib.   Stairs Stairs: Yes Stairs assistance: Min guard Stair Management: One rail Right;Step to pattern Number of Stairs: 2 General stair comments: Cues for technique. Min guard for safety. BUEs on 1 rails.  Wheelchair Mobility    Modified Rankin (Stroke Patients Only)       Balance Overall balance assessment: Needs assistance Sitting-balance support: Feet supported;No upper extremity supported Sitting balance-Leahy Scale: Fair     Standing balance support: During functional activity Standing balance-Leahy Scale: Fair                      Cognition Arousal/Alertness: Awake/alert Behavior During Therapy: WFL for tasks assessed/performed Overall Cognitive Status: Within Functional Limits for tasks assessed                      Exercises      General Comments        Pertinent Vitals/Pain Pain Assessment: Faces Faces Pain Scale: Hurts little more Pain Location: abdomen with transfers/bed mobility Pain Descriptors / Indicators: Sore Pain Intervention(s): Monitored during session;Repositioned    Home Living                      Prior Function            PT Goals (current goals can now be found in the care plan section) Progress towards PT goals: Progressing toward goals    Frequency  Min 3X/week    PT  Plan Current plan remains appropriate    Co-evaluation             End of Session Equipment Utilized During Treatment: Gait belt Activity Tolerance: Patient tolerated treatment well Patient left: in chair;with call bell/phone within reach     Time: 0946-1020 PT Time Calculation (min) (ACUTE ONLY): 34 min  Charges:  $Gait Training: 8-22 mins $Therapeutic Activity: 8-22 mins                    G CodesCandy Sledge A 11-30-2014, 10:36 AM   Candy Sledge, [place], DPT [phone]

## 2014-11-19 NOTE — Progress Notes (Signed)
Patient ID: Cameron [name], male   DOB: 02-21-1934, 81 y.o.   MRN: [id number]  [facility]  POD#: 3  Subjective: Patient ambulated this AM.  Tolerating regular diet.  Pain better controlled.  Passing flatus, no BM.  Objective: Vital signs in last 24 hours: Temp:  [98.1 F (36.7 C)-98.6 F (37 C)] 98.6 F (37 C) (02/18 0634) Pulse Rate:  [85-101] 100 (02/18 0634) Resp:  [20] 20 (02/18 0634) BP: (96-136)/(59-80) 110/59 mmHg (02/18 0634) SpO2:  [97 %-98 %] 98 % (02/18 0634) Last BM Date: 11/16/14  Intake/Output from previous day: 02/17 0701 - 02/18 0700 In: 1200 [P.O.:600; I.V.:600] Out: 530 [Urine:500; Drains:30] Intake/Output this shift:    Physical Exam: HEENT - sclerae clear, mucous membranes moist Neck - soft Chest - clear bilaterally Cor - RRR Abdomen - soft; dressings removed - some blisters from tape; JP drain removed; staples remain in place Ext - no edema, non-tender Neuro - alert & oriented, no focal deficits  Lab Results:   Recent Labs  11/16/14 1247 11/17/14 0500  WBC  --  13.2*  HGB 15.3 11.0*  HCT 45.0 34.2*  PLT  --  215   BMET  Recent Labs  11/16/14 1247 11/17/14 0500  NA 139 136  K 3.4* 4.3  CL  --  100  CO2  --  25  GLUCOSE 118* 146*  BUN  --  14  CREATININE  --  1.03  CALCIUM  --  8.1*   PT/INR  Recent Labs  11/16/14 1242  LABPROT 14.8  INR 1.15   Comprehensive Metabolic Panel:    Component Value Date/Time   NA 136 11/17/2014 0500   NA 139 11/16/2014 1247   K 4.3 11/17/2014 0500   K 3.4* 11/16/2014 1247   CL 100 11/17/2014 0500   CL 99 11/09/2014 0853   CO2 25 11/17/2014 0500   CO2 30 11/09/2014 0853   BUN 14 11/17/2014 0500   BUN 6 11/09/2014 0853   CREATININE 1.03 11/17/2014 0500   CREATININE 0.98 11/09/2014 0853   GLUCOSE 146* 11/17/2014 0500   GLUCOSE 118* 11/16/2014 1247   CALCIUM 8.1* 11/17/2014 0500   CALCIUM 9.0 11/09/2014 0853   AST 31 11/17/2014 0500   AST 24  09/05/2014 0330   ALT 21 11/17/2014 0500   ALT 19 09/05/2014 0330   ALKPHOS 77 11/17/2014 0500   ALKPHOS 104 09/05/2014 0330   BILITOT 0.9 11/17/2014 0500   BILITOT 1.2 09/05/2014 0330   PROT 6.0 11/17/2014 0500   PROT 6.5 09/05/2014 0330   ALBUMIN 2.8* 11/17/2014 0500   ALBUMIN 2.3* 09/05/2014 0330    Studies/Results: No results found.  Anti-infectives: Anti-infectives    Start     Dose/Rate Route Frequency Ordered Stop   11/16/14 0600  ceFAZolin (ANCEF) IVPB 2 g/50 mL premix     2 g 100 mL/hr over 30 Minutes Intravenous On call to O.R. 11/15/14 1408 11/16/14 1513      Assessment & Plans: Status post open cholecystectomy  heplock IV  Encourage OOB, ambulation  Home later today or tomorrow - patient will discuss with his son  Earnstine Regal, MD, [facility] Office: [place] 11/19/2014

## 2014-11-20 NOTE — Discharge Summary (Signed)
  Physician Discharge Summary [facility]  Patient ID: Cameron [name] MRN: [id number] DOB/AGE: [birth year] 81 y.o.  Admit date: 11/16/2014 Discharge date: 11/20/2014  Admission Diagnoses:  Cholecystitis, cholelithiasis, presence of percutaneous cholecystostomy drain  Discharge Diagnoses:  Principal Problem:   Cholelithiasis Active Problems:   Acute cholecystitis   Choledocholithiasis   Cholelithiasis with acute cholecystitis   Discharged Condition: good  Hospital Course: patient admitted after open cholecystectomy.  Post op course uncomplicated.  Pain well controlled.  Diet advanced as tolerated to regular.  Ambulatory with assistance.  Assessed by PT and OT.  Will go home with son.  See in office 5 days for wound check and suture removal.  Consults: None  Treatments: surgery: open cholecystectomy  Discharge Exam: Blood pressure 122/66, pulse 85, temperature 98.7 F (37.1 C), temperature source Oral, resp. rate 17, height 5\' 9"  (1.753 m), weight 206 lb 9.6 oz (93.713 kg), SpO2 100 %. See progress notes.   Disposition: Home     Medication List    TAKE these medications        HYDROcodone-acetaminophen 5-325 MG per tablet  Commonly known as:  NORCO/VICODIN  Take 1-2 tablets by mouth every 4 (four) hours as needed for moderate pain.      ASK your doctor about these medications        bimatoprost 0.01 % Soln  Commonly known as:  LUMIGAN  Place 1 drop into both eyes at bedtime.     cyanocobalamin 1000 MCG/ML injection  Commonly known as:  (VITAMIN B-12)  Inject 1,000 mcg into the muscle every 30 (thirty) days.     doxazosin 4 MG tablet  Commonly known as:  CARDURA  Take 1 tablet (4 mg total) by mouth daily.     folic acid 1 MG tablet  Commonly known as:  FOLVITE  Take 1 mg by mouth daily.     furosemide 40 MG tablet  Commonly known as:  LASIX  TAKE 1 TABLET BY MOUTH EVERY DAY AND EXTRA if more swelling noted  In lower extremities     meloxicam 15 MG tablet  Commonly known as:  MOBIC  Take 15 mg by mouth daily as needed for pain.     metoprolol succinate 25 MG 24 hr tablet  Commonly known as:  TOPROL-XL  Take 25 mg by mouth every morning.     potassium chloride 10 MEQ tablet  Commonly known as:  K-DUR  Take 1 tablet (10 mEq total) by mouth daily.     rivaroxaban 20 MG Tabs tablet  Commonly known as:  XARELTO  Take 20 mg by mouth daily with supper.     traMADol 50 MG tablet  Commonly known as:  ULTRAM  Take 50 mg by mouth every 4 (four) hours as needed for moderate pain or severe pain. Maximum dose= 8 tablets per day           Follow-up Information    Follow up with Earnstine Regal, MD. Schedule an appointment as soon as possible for a visit in 5 days.   Specialty:  General Surgery   Why:  For suture removal   Contact information:   [address] [place] [address] [phone]       Earnstine Regal, MD, [facility] Office: [phone]   Signed: Earnstine Regal 11/20/2014, 8:47 AM

## 2014-11-20 NOTE — Progress Notes (Signed)
AVS discharge instructions were reviewed with patient and his daughter in law. Prescription for hydrocodone was given to patient's daughter in law. Patient and his daughter in law stated that they did not have any questions. Volunteers assisted patient to his transportation.

## 2014-11-20 NOTE — Plan of Care (Signed)
Problem: Discharge Progression Outcomes Goal: Staples/sutures removed Outcome: Not Applicable Date Met:  11/20/14 Staples will be removed at MD's office     

## 2014-11-20 NOTE — Progress Notes (Signed)
Physical Therapy Treatment Patient Details Name: Cameron [name] MRN: [id number] DOB: [birth year] Today's Date: 11/20/2014    History of Present Illness Patient is a 81 y/o male with PMH of HTN, OSA, neuropathy, chronic diastolic heart failure and A-fib who is s/p open cholecystectomy and lysis of adhesions.    PT Comments    Patient progressing well with mobility. Tolerated higher level balance challenges without deviations in gait or LOB. Cues to increase gait speed to assist with balance. Education provided on bracing for coughing/sneezing. Education provided on how to have family assist with stair negotiation to enter home. Pt to be discharged today.    Follow Up Recommendations  Supervision/Assistance - 24 hour;No PT follow up     Equipment Recommendations  None recommended by PT    Recommendations for Other Services       Precautions / Restrictions Precautions Precautions: Fall Precaution Comments: Monitor HR; abdominal incision Restrictions Weight Bearing Restrictions: No    Mobility  Bed Mobility               General bed mobility comments: Pt up in recliner upon my arrival  Transfers Overall transfer level: Needs assistance Equipment used: Rolling walker (2 wheeled) Transfers: Sit to/from Stand Sit to Stand: Supervision         General transfer comment: Supervision for safety. Stood from Youth worker.  Ambulation/Gait Ambulation/Gait assistance: Supervision Ambulation Distance (Feet): 250 Feet Assistive device: Rolling walker (2 wheeled) Gait Pattern/deviations: Step-through pattern;Decreased stride length;Trunk flexed   Gait velocity interpretation: Below normal speed for age/gender General Gait Details: Pt with slow and steady gait. Tolerated balance challenges without difficulty.   Stairs Stairs:  (Pt politely declined stair negotiation today.)          Wheelchair Mobility    Modified Rankin (Stroke Patients Only)       Balance  Overall balance assessment: Needs assistance Sitting-balance support: Feet supported;No upper extremity supported Sitting balance-Leahy Scale: Good     Standing balance support: During functional activity Standing balance-Leahy Scale: Fair Standing balance comment: Tolerated dynamic standing during urination without LOB.                    Cognition Arousal/Alertness: Awake/alert Behavior During Therapy: WFL for tasks assessed/performed Overall Cognitive Status: Within Functional Limits for tasks assessed                      Exercises      General Comments        Pertinent Vitals/Pain Pain Assessment: Faces Faces Pain Scale: Hurts little more Pain Location: abdomen Pain Descriptors / Indicators: Sore Pain Intervention(s): Monitored during session    Home Living                      Prior Function            PT Goals (current goals can now be found in the care plan section) Progress towards PT goals: Progressing toward goals    Frequency  Min 3X/week    PT Plan Current plan remains appropriate    Co-evaluation             End of Session Equipment Utilized During Treatment: Gait belt Activity Tolerance: Patient tolerated treatment well Patient left: in chair;with call bell/phone within reach     Time: 1204-1223 PT Time Calculation (min) (ACUTE ONLY): 19 min  Charges:  $Gait Training: 8-22 mins  G CodesCandy Sledge A 12/08/14, 12:25 PM  Candy Sledge, [place], DPT [phone]

## 2014-11-20 NOTE — Discharge Instructions (Signed)
[facility]  OPEN ABDOMINAL SURGERY: POST OP INSTRUCTIONS  Always review your discharge instruction sheet given to you by the facility where your surgery was performed.  1. A prescription for pain medication may be given to you upon discharge.  Take your pain medication as prescribed.  If narcotic pain medicine is not needed, then you may take acetaminophen (Tylenol) or ibuprofen (Advil) as needed. 2. Take your usually prescribed medications unless otherwise directed. 3. If you need a refill on your pain medication, please contact your pharmacy. They will contact our office to request authorization.  Prescriptions will not be filled after 5 pm or on weekends. 4. You should follow a light diet the first few days after arrival home, such as soup and crackers, unless your doctor has advised otherwise. A high-fiber, low fat diet can be resumed as tolerated.  Be sure to include plenty of fluids daily.  5. Most patients will experience some swelling and bruising in the area of the incision. Ice packs will help. Swelling and bruising can take several days to resolve. 6. It is common to experience some constipation if taking pain medication after surgery.  Increasing fluid intake and taking a stool softener will usually help or prevent this problem from occurring.  A mild laxative (Milk of Magnesia or Miralax) should be taken according to package directions if there are no bowel movements after 48 hours. 7.  You may have steri-strips (small skin tapes) in place directly over the incision.  These strips should be left on the skin for 7-10 days.  If your surgeon used skin glue on the incision, you may shower in 24 hours.  The glue will flake off over the next 2-3 weeks.  Any sutures or staples will be removed at the office during your follow-up visit. You may find that a light gauze bandage over your incision may keep your staples from being rubbed or pulled. You may shower and replace the bandage  daily. 8. ACTIVITIES:  You may resume regular (light) daily activities beginning the next day-such as daily self-care, walking, climbing stairs-gradually increasing activities as tolerated.  You may have sexual intercourse when it is comfortable.  Refrain from any heavy lifting or straining until approved by your doctor.  You may drive when you no longer are taking prescription pain medication, you can comfortably wear a seatbelt, and you can safely maneuver your car and apply brakes. 9. You should see your doctor in the office for a follow-up appointment approximately two weeks after your surgery.  Make sure that you call for this appointment within a day or two after you arrive home to insure a convenient appointment time.  WHEN TO CALL YOUR DOCTOR: 1. Fever greater than 101.0 2. Inability to urinate 3. Persistent nausea and/or vomiting 4. Extreme swelling or bruising 5. Continued bleeding from incision 6. Increased pain, redness, or drainage from the incision 7. Difficulty swallowing or breathing 8. Muscle cramping or spasms 9. Numbness or tingling in hands or around lips  IF YOU HAVE DISABILITY OR FAMILY LEAVE FORMS, YOU MUST BRING THEM TO THE OFFICE FOR PROCESSING.  PLEASE DO NOT GIVE THEM TO YOUR DOCTOR.  The clinic staff is available to answer your questions during regular business hours.  Please don't hesitate to call and ask to speak to one of the nurses if you have concerns.  [facility] Office: [phone]  For further questions, please visit [email or web address]   

## 2014-11-24 ENCOUNTER — Encounter: Payer: Self-pay | Admitting: Cardiology

## 2014-12-10 DIAGNOSIS — I739 Peripheral vascular disease, unspecified: Secondary | ICD-10-CM | POA: Diagnosis not present

## 2014-12-10 DIAGNOSIS — L603 Nail dystrophy: Secondary | ICD-10-CM | POA: Diagnosis not present

## 2014-12-10 DIAGNOSIS — L84 Corns and callosities: Secondary | ICD-10-CM | POA: Diagnosis not present

## 2014-12-11 DIAGNOSIS — E538 Deficiency of other specified B group vitamins: Secondary | ICD-10-CM | POA: Diagnosis not present

## 2014-12-22 DIAGNOSIS — H4011X2 Primary open-angle glaucoma, moderate stage: Secondary | ICD-10-CM | POA: Diagnosis not present

## 2014-12-22 DIAGNOSIS — Z961 Presence of intraocular lens: Secondary | ICD-10-CM | POA: Diagnosis not present

## 2014-12-31 DIAGNOSIS — M109 Gout, unspecified: Secondary | ICD-10-CM | POA: Diagnosis not present

## 2015-01-08 DIAGNOSIS — E538 Deficiency of other specified B group vitamins: Secondary | ICD-10-CM | POA: Diagnosis not present

## 2015-01-09 ENCOUNTER — Other Ambulatory Visit: Payer: Self-pay | Admitting: Cardiology

## 2015-01-19 ENCOUNTER — Other Ambulatory Visit: Payer: Self-pay | Admitting: Cardiology

## 2015-01-20 ENCOUNTER — Other Ambulatory Visit: Payer: Self-pay | Admitting: *Deleted

## 2015-01-20 MED ORDER — METOPROLOL SUCCINATE ER 25 MG PO TB24: 25.0000 mg | ORAL_TABLET | Freq: Every morning | ORAL | Status: DC

## 2015-02-05 DIAGNOSIS — E538 Deficiency of other specified B group vitamins: Secondary | ICD-10-CM | POA: Diagnosis not present

## 2015-02-18 DIAGNOSIS — L84 Corns and callosities: Secondary | ICD-10-CM | POA: Diagnosis not present

## 2015-02-18 DIAGNOSIS — L603 Nail dystrophy: Secondary | ICD-10-CM | POA: Diagnosis not present

## 2015-02-18 DIAGNOSIS — I739 Peripheral vascular disease, unspecified: Secondary | ICD-10-CM | POA: Diagnosis not present

## 2015-02-24 NOTE — Progress Notes (Signed)
Cardiology Office Note   Date:  02/25/2015   ID:  Cameron [name], DOB July 22, 1934, MRN [id number]  PCP:  Cameron Huh, MD    Chief Complaint  Patient presents with  . Follow-up    OSA      History of Present Illness: Cameron [name] is a 81 y.o. male with chronic atrial fibrillation, asymptomatic bradycardia, systemic anticoagulation and OSA who presents today for followup. He is doing well with his device. He feels rested in the am and has no daytime sleepiness. He tolerates his nasal pillow mask with chin strap without any problems and feels the pressure is adequate. He denies any chest pain.He has occasional LLE edema at the ankle. He denies any palpitations, chest pain, SOB, DOE, LE edema or dizziness.     Past Medical History  Diagnosis Date  . Rosacea   . Gout     in 70's, "not in toe but everywhere else"  . Vitamin B12 deficiency   . Arthritis   . Neuromuscular disorder   . Peripheral neuropathy     both feet, can feel pain  . Hypertension   . Chronic anticoagulation   . Low back pain     when lying flat  . Anemia     hx of  . Swelling     both legs sometimes  . Chronic atrial fibrillation   . Acute cholecystitis 09/02/2014  . IFG (impaired fasting glucose) 09/05/2014  . Diabetes mellitus without complication     borderline  . Chronic kidney disease 2016    January UTI  . GERD (gastroesophageal reflux disease)   . OSA (obstructive sleep apnea)     moderate with AHI 17.53/hr now on CPAP at 7cm H2O    Past Surgical History  Procedure Laterality Date  . Hemicolectomy  03/1996    with 3 lymph nodes  . Cardioversion      at least 5 times  . Appendectomy      1997  . Tumor removal Right 1961    back of leg  . Cataract extraction Bilateral   . Inguinal hernia repair Left 08/21/2013    Procedure:  LEFT INGUINAL HERNIA REPAIR WITH MESH;  Surgeon: Earnstine Regal, MD;  Location: [facility];  Service: General;  Laterality: Left;  . Insertion of  mesh Left 08/21/2013    Procedure: INSERTION OF MESH;  Surgeon: Earnstine Regal, MD;  Location: [facility];  Service: General;  Laterality: Left;  . Cardioversion N/A 10/24/2013    Procedure: CARDIOVERSION;  Surgeon: Sueanne Margarita, MD;  Location: [facility];  Service: Cardiovascular;  Laterality: N/A;  . Inguinal hernia repair Right 01/29/2014    Procedure:  REPAIR RIGHT INGUNIAL HERNIA WITH MESH;  Surgeon: Earnstine Regal, MD;  Location: [facility];  Service: General;  Laterality: Right;  . Hernia repair    . Eye surgery    . Colon surgery    . Cholecystectomy N/A 11/16/2014    Procedure: ATTEMPTED LAPAROSCOPIC CHOLECYSTECTOMY TO OPEN CHOLECYSTECTOMY WITH LYSIS OF ADHESIONS - 30MINUTES;  Surgeon: Armandina Gemma, MD;  Location: [facility];  Service: General;  Laterality: N/A;     Current Outpatient Prescriptions  Medication Sig Dispense Refill  . bimatoprost (LUMIGAN) 0.01 % SOLN Place 1 drop into both eyes at bedtime.     . cyanocobalamin (,VITAMIN B-12,) 1000 MCG/ML injection Inject 1,000 mcg into the muscle every 30 (thirty) days.     Marland Kitchen doxazosin (CARDURA) 4 MG tablet Take 1 tablet (  4 mg total) by mouth daily. 90 tablet 1  . folic acid (FOLVITE) 1 MG tablet Take 1 mg by mouth daily.     . furosemide (LASIX) 40 MG tablet TAKE 1 TABLET BY MOUTH EVERY DAY AND EXTRA if more swelling noted  In lower extremities (Patient taking differently: Take 40 mg by mouth daily. ) 90 tablet 1  . meloxicam (MOBIC) 15 MG tablet Take 15 mg by mouth daily as needed for pain.     . metoprolol succinate (TOPROL-XL) 25 MG 24 hr tablet Take 1 tablet (25 mg total) by mouth every morning. 90 tablet 0  . rivaroxaban (XARELTO) 20 MG TABS tablet Take 20 mg by mouth daily with supper.    . traMADol (ULTRAM) 50 MG tablet Take 50 mg by mouth every 4 (four) hours as needed for moderate pain or severe pain. Maximum dose= 8 tablets per day     No current facility-administered medications for this visit.    Allergies:   Ace inhibitors and  Clonidine derivatives    Social History:  The patient  reports that he quit smoking about 40 years ago. His smoking use included Cigarettes. He has a 40 pack-year smoking history. He has never used smokeless tobacco. He reports that he drinks alcohol. He reports that he does not use illicit drugs.   Family History:  The patient's family history includes Allergies in his daughter; Asthma in his daughter; Cancer in his father; Dementia in his sister; Stroke (age of onset: 52) in his mother.    ROS:  Please see the history of present illness.   Otherwise, review of systems are positive for none.   All other systems are reviewed and negative.    PHYSICAL EXAM: VS:  BP 140/62 mmHg  Pulse 96  Ht 5' 8.5" (1.74 m)  Wt 207 lb (93.895 kg)  BMI 31.01 kg/m2  SpO2 98% , BMI Body mass index is 31.01 kg/(m^2). GEN: Well nourished, well developed, in no acute distress HEENT: normal Neck: no JVD, carotid bruits, or masses Cardiac: RRR; no murmurs, rubs, or gallops,no edema  Respiratory:  clear to auscultation bilaterally, normal work of breathing GI: soft, nontender, nondistended, + BS MS: no deformity or atrophy Skin: warm and dry, no rash Neuro:  Strength and sensation are intact Psych: euthymic mood, full affect   EKG:  EKG is not ordered today.    Recent Labs: 09/02/2014: Pro B Natriuretic peptide (BNP) 800.4* 09/07/2014: Magnesium 2.0 11/17/2014: ALT 21; BUN 14; Creatinine 1.03; Hemoglobin 11.0*; Platelets 215; Potassium 4.3; Sodium 136    Lipid Panel No results found for: CHOL, TRIG, HDL, CHOLHDL, VLDL, LDLCALC, LDLDIRECT    Wt Readings from Last 3 Encounters:  02/25/15 207 lb (93.895 kg)  11/16/14 206 lb 9.6 oz (93.713 kg)  09/28/14 198 lb 12.8 oz (90.175 kg)    ASSESSMENT AND PLAN:  1. Chronic atrial fibrillation/flutter - rate controlled - continue Xarelto/Toprol  2. Asymptomatic bradycardia - resolved 3. Moderate OSA  - His d/l today showed an AHI of 4.9/hr on 7cm H2O  with 87% compliance in using more than 4 hours nightly 4. HTN - well controlled - continue Doxazosin/Metoprolol 5. Chronic LE edema - controlled on diuretics - check BMET 6. Chronic diastolic CHF - appears euvolemic on exam   Current medicines are reviewed at length with the patient today.  The patient does not have concerns regarding medicines.  The following changes have been made:  no change  Labs/ tests ordered today include:  see above assessment and plan  Orders Placed This Encounter  Procedures  . Basic Metabolic Panel (BMET)     Disposition:   FU with me in 6 months   Signed, Sueanne Margarita, MD  02/25/2015 8:44 AM    [facility] [place], [place], [place]  [address] Phone: [phone]; Fax: [phone]

## 2015-02-25 ENCOUNTER — Ambulatory Visit (INDEPENDENT_AMBULATORY_CARE_PROVIDER_SITE_OTHER): Payer: Medicare Other | Admitting: Cardiology

## 2015-02-25 ENCOUNTER — Encounter: Payer: Self-pay | Admitting: Cardiology

## 2015-02-25 VITALS — BP 140/62 | HR 96 | Ht 68.5 in | Wt 207.0 lb

## 2015-02-25 DIAGNOSIS — I10 Essential (primary) hypertension: Secondary | ICD-10-CM

## 2015-02-25 DIAGNOSIS — I5032 Chronic diastolic (congestive) heart failure: Secondary | ICD-10-CM

## 2015-02-25 DIAGNOSIS — G4733 Obstructive sleep apnea (adult) (pediatric): Secondary | ICD-10-CM | POA: Diagnosis not present

## 2015-02-25 DIAGNOSIS — I482 Chronic atrial fibrillation, unspecified: Secondary | ICD-10-CM

## 2015-02-25 LAB — BASIC METABOLIC PANEL
BUN: 15 mg/dL (ref 6–23)
CALCIUM: 9.2 mg/dL (ref 8.4–10.5)
CO2: 28 mEq/L (ref 19–32)
CREATININE: 1 mg/dL (ref 0.40–1.50)
Chloride: 102 mEq/L (ref 96–112)
GFR: 76.68 mL/min (ref >60.00)
Glucose, Bld: 155 mg/dL — ABNORMAL HIGH (ref 70–99)
Potassium: 3.7 mEq/L (ref 3.5–5.1)
Sodium: 137 mEq/L (ref 135–145)

## 2015-02-25 NOTE — Patient Instructions (Signed)

## 2015-03-05 DIAGNOSIS — E538 Deficiency of other specified B group vitamins: Secondary | ICD-10-CM | POA: Diagnosis not present

## 2015-04-02 DIAGNOSIS — E538 Deficiency of other specified B group vitamins: Secondary | ICD-10-CM | POA: Diagnosis not present

## 2015-04-09 DIAGNOSIS — H00012 Hordeolum externum right lower eyelid: Secondary | ICD-10-CM | POA: Diagnosis not present

## 2015-04-19 ENCOUNTER — Other Ambulatory Visit: Payer: Self-pay | Admitting: Cardiology

## 2015-04-30 DIAGNOSIS — E538 Deficiency of other specified B group vitamins: Secondary | ICD-10-CM | POA: Diagnosis not present

## 2015-05-06 DIAGNOSIS — I739 Peripheral vascular disease, unspecified: Secondary | ICD-10-CM | POA: Diagnosis not present

## 2015-05-06 DIAGNOSIS — L84 Corns and callosities: Secondary | ICD-10-CM | POA: Diagnosis not present

## 2015-05-06 DIAGNOSIS — L603 Nail dystrophy: Secondary | ICD-10-CM | POA: Diagnosis not present

## 2015-05-28 DIAGNOSIS — Z23 Encounter for immunization: Secondary | ICD-10-CM | POA: Diagnosis not present

## 2015-05-28 DIAGNOSIS — E538 Deficiency of other specified B group vitamins: Secondary | ICD-10-CM | POA: Diagnosis not present

## 2015-06-23 DIAGNOSIS — H4011X2 Primary open-angle glaucoma, moderate stage: Secondary | ICD-10-CM | POA: Diagnosis not present

## 2015-06-25 DIAGNOSIS — D510 Vitamin B12 deficiency anemia due to intrinsic factor deficiency: Secondary | ICD-10-CM | POA: Diagnosis not present

## 2015-06-30 ENCOUNTER — Telehealth: Payer: Self-pay | Admitting: *Deleted

## 2015-06-30 NOTE — Telephone Encounter (Signed)
Pt wants xarelto refill, will Dr, Radford Pax pick this up? It was given in the hospital, please advise.Marland Kitchen

## 2015-06-30 NOTE — Telephone Encounter (Signed)
OK to fill

## 2015-07-01 ENCOUNTER — Other Ambulatory Visit: Payer: Self-pay | Admitting: *Deleted

## 2015-07-01 MED ORDER — RIVAROXABAN 20 MG PO TABS: 20.0000 mg | ORAL_TABLET | Freq: Every day | ORAL | Status: DC

## 2015-07-02 ENCOUNTER — Other Ambulatory Visit: Payer: Self-pay | Admitting: *Deleted

## 2015-07-02 MED ORDER — RIVAROXABAN 20 MG PO TABS: 20.0000 mg | ORAL_TABLET | Freq: Every day | ORAL | Status: DC

## 2015-07-22 DIAGNOSIS — L603 Nail dystrophy: Secondary | ICD-10-CM | POA: Diagnosis not present

## 2015-07-22 DIAGNOSIS — L84 Corns and callosities: Secondary | ICD-10-CM | POA: Diagnosis not present

## 2015-07-22 DIAGNOSIS — I739 Peripheral vascular disease, unspecified: Secondary | ICD-10-CM | POA: Diagnosis not present

## 2015-07-23 DIAGNOSIS — E538 Deficiency of other specified B group vitamins: Secondary | ICD-10-CM | POA: Diagnosis not present

## 2015-08-25 ENCOUNTER — Encounter: Payer: Self-pay | Admitting: Cardiology

## 2015-08-25 ENCOUNTER — Ambulatory Visit (INDEPENDENT_AMBULATORY_CARE_PROVIDER_SITE_OTHER): Payer: Medicare Other | Admitting: Cardiology

## 2015-08-25 VITALS — BP 142/70 | HR 87 | Ht 70.0 in | Wt 212.2 lb

## 2015-08-25 DIAGNOSIS — I10 Essential (primary) hypertension: Secondary | ICD-10-CM

## 2015-08-25 DIAGNOSIS — I5032 Chronic diastolic (congestive) heart failure: Secondary | ICD-10-CM | POA: Diagnosis not present

## 2015-08-25 DIAGNOSIS — I482 Chronic atrial fibrillation, unspecified: Secondary | ICD-10-CM

## 2015-08-25 DIAGNOSIS — I4891 Unspecified atrial fibrillation: Secondary | ICD-10-CM | POA: Diagnosis not present

## 2015-08-25 DIAGNOSIS — G4733 Obstructive sleep apnea (adult) (pediatric): Secondary | ICD-10-CM

## 2015-08-25 LAB — CBC WITH DIFFERENTIAL/PLATELET
Basophils Absolute: 0 10*3/uL (ref 0.0–0.1)
Basophils Relative: 0 % (ref 0–1)
EOS PCT: 3 % (ref 0–5)
Eosinophils Absolute: 0.2 10*3/uL (ref 0.0–0.7)
HCT: 40.1 % (ref 39.0–52.0)
HEMOGLOBIN: 13.1 g/dL (ref 13.0–17.0)
LYMPHS ABS: 1.2 10*3/uL (ref 0.7–4.0)
Lymphocytes Relative: 24 % (ref 12–46)
MCH: 27.5 pg (ref 26.0–34.0)
MCHC: 32.7 g/dL (ref 30.0–36.0)
MCV: 84.1 fL (ref 78.0–100.0)
MPV: 10.3 fL (ref 8.6–12.4)
Monocytes Absolute: 0.7 10*3/uL (ref 0.1–1.0)
Monocytes Relative: 13 % — ABNORMAL HIGH (ref 3–12)
NEUTROS PCT: 60 % (ref 43–77)
Neutro Abs: 3 10*3/uL (ref 1.7–7.7)
Platelets: 158 10*3/uL (ref 150–400)
RBC: 4.77 MIL/uL (ref 4.22–5.81)
RDW: 16.3 % — ABNORMAL HIGH (ref 11.5–15.5)
WBC: 5 10*3/uL (ref 4.0–10.5)

## 2015-08-25 LAB — BASIC METABOLIC PANEL
BUN: 17 mg/dL (ref 7–25)
CHLORIDE: 104 mmol/L (ref 98–110)
CO2: 26 mmol/L (ref 20–31)
Calcium: 9 mg/dL (ref 8.6–10.3)
Creat: 1.05 mg/dL (ref 0.70–1.18)
Glucose, Bld: 123 mg/dL — ABNORMAL HIGH (ref 65–99)
Potassium: 3.7 mmol/L (ref 3.5–5.3)
SODIUM: 141 mmol/L (ref 135–146)

## 2015-08-25 NOTE — Patient Instructions (Signed)

## 2015-08-25 NOTE — Progress Notes (Signed)
Cardiology Office Note   Date:  08/25/2015   ID:  Cameron [name], DOB [birth year], MRN [id number]  PCP:  Simona Huh, MD    Chief Complaint  Patient presents with  . Congestive Heart Failure  . Atrial Fibrillation      History of Present Illness: Cameron [name] is a 81 y.o. male with chronic atrial fibrillation, asymptomatic bradycardia, systemic anticoagulation and OSA who presents today for followup. He is doing well with his device. He feels rested in the am and has no daytime sleepiness. He tolerates his nasal pillow mask with chin strap without any problems and feels the pressure is adequate. He does not get up at night.  He denies any chest pain.He has occasional LLE edema at the ankle. He denies any palpitations, chest pain, SOB, DOE, LE edema or dizziness.    Past Medical History  Diagnosis Date  . Rosacea   . Gout     in 70's, "not in toe but everywhere else"  . Vitamin B12 deficiency   . Arthritis   . Neuromuscular disorder ([place])   . Peripheral neuropathy (H[facility])     both feet, can feel pain  . Hypertension   . Chronic anticoagulation   . Low back pain     when lying flat  . Anemia     hx of  . Swelling     both legs sometimes  . Chronic atrial fibrillation ([place])   . Acute cholecystitis 09/02/2014  . IFG (impaired fasting glucose) 09/05/2014  . Diabetes mellitus without complication (H[facility])     borderline  . Chronic kidney disease 2016    January UTI  . GERD (gastroesophageal reflux disease)   . OSA (obstructive sleep apnea)     moderate with AHI 17.53/hr now on CPAP at 7cm H2O    Past Surgical History  Procedure Laterality Date  . Hemicolectomy  03/1996    with 3 lymph nodes  . Cardioversion      at least 5 times  . Appendectomy      1997  . Tumor removal Right 1961    back of leg  . Cataract extraction Bilateral   . Inguinal hernia repair Left 08/21/2013    Procedure:  LEFT INGUINAL HERNIA REPAIR WITH MESH;   Surgeon: Earnstine Regal, MD;  Location: [facility];  Service: General;  Laterality: Left;  . Insertion of mesh Left 08/21/2013    Procedure: INSERTION OF MESH;  Surgeon: Earnstine Regal, MD;  Location: [facility];  Service: General;  Laterality: Left;  . Cardioversion N/A 10/24/2013    Procedure: CARDIOVERSION;  Surgeon: Sueanne Margarita, MD;  [facility];  Service: Cardiovascular;  Laterality: N/A;  . Inguinal hernia repair Right 01/29/2014    Procedure:  REPAIR RIGHT INGUNIAL HERNIA WITH MESH;  Surgeon: Earnstine Regal, MD;  Location: [facility];  Service: General;  Laterality: Right;  . Hernia repair    . Eye surgery    . Colon surgery    . Cholecystectomy N/A 11/16/2014    Procedure: ATTEMPTED LAPAROSCOPIC CHOLECYSTECTOMY TO OPEN CHOLECYSTECTOMY WITH LYSIS OF ADHESIONS - 30MINUTES;  Surgeon: Armandina Gemma, MD;  Location: [facility];  Service: General;  Laterality: N/A;     Current Outpatient Prescriptions  Medication Sig Dispense Refill  . bimatoprost (LUMIGAN) 0.01 % SOLN Place 1 drop into both eyes at bedtime.     . cyanocobalamin (,VITAMIN  B-12,) 1000 MCG/ML injection Inject 1,000 mcg into the muscle every 30 (thirty) days.     Marland Kitchen doxazosin (CARDURA) 4 MG tablet TAKE 1 TABLET BY MOUTH DAILY 90 tablet 1  . folic acid (FOLVITE) 1 MG tablet Take 1 mg by mouth daily.     . furosemide (LASIX) 40 MG tablet Take 1 tablet (40 mg total) by mouth daily. Take one extra daily as needed for swelling in lower extremities. 135 tablet 1  . meloxicam (MOBIC) 15 MG tablet Take 15 mg by mouth daily as needed for pain.     . metoprolol succinate (TOPROL-XL) 25 MG 24 hr tablet TAKE 1 TABLET(25 MG) BY MOUTH EVERY MORNING 90 tablet 1  . rivaroxaban (XARELTO) 20 MG TABS tablet Take 1 tablet (20 mg total) by mouth daily with supper. 90 tablet 0  . traMADol (ULTRAM) 50 MG tablet Take 50 mg by mouth every 4 (four) hours as needed for moderate pain or severe pain. Maximum dose= 8 tablets per day     No current  facility-administered medications for this visit.    Allergies:   Ace inhibitors and Clonidine derivatives    Social History:  The patient  reports that he quit smoking about 40 years ago. His smoking use included Cigarettes. He has a 40 pack-year smoking history. He has never used smokeless tobacco. He reports that he drinks alcohol. He reports that he does not use illicit drugs.   Family History:  The patient's family history includes Allergies in his daughter; Asthma in his daughter; Cancer in his father; Dementia in his sister; Stroke (age of onset: 48) in his mother.    ROS:  Please see the history of present illness.   Otherwise, review of systems are positive for none.   All other systems are reviewed and negative.    PHYSICAL EXAM: VS:  BP 142/70 mmHg  Pulse 87  Ht 5\' 10"  (1.778 m)  Wt 96.253 kg (212 lb 3.2 oz)  BMI 30.45 kg/m2 , BMI Body mass index is 30.45 kg/(m^2). GEN: Well nourished, well developed, in no acute distress HEENT: normal Neck: no JVD, carotid bruits, or masses Cardiac: RRR; no murmurs, rubs, or gallops,no edema  Respiratory:  clear to auscultation bilaterally, normal work of breathing GI: soft, nontender, nondistended, + BS MS: no deformity or atrophy Skin: warm and dry, no rash Neuro:  Strength and sensation are intact Psych: euthymic mood, full affect   EKG:  EKG is ordered today. The ekg ordered today demonstrates atrial fibrillation with CVR   Recent Labs: 09/02/2014: Pro B Natriuretic peptide (BNP) 800.4* 09/07/2014: Magnesium 2.0 11/17/2014: ALT 21; Hemoglobin 11.0*; Platelets 215 02/25/2015: BUN 15; Creatinine, Ser 1.00; Potassium 3.7; Sodium 137    Lipid Panel No results found for: CHOL, TRIG, HDL, CHOLHDL, VLDL, LDLCALC, LDLDIRECT    Wt Readings from Last 3 Encounters:  08/25/15 96.253 kg (212 lb 3.2 oz)  02/25/15 93.895 kg (207 lb)  11/16/14 93.713 kg (206 lb 9.6 oz)    ASSESSMENT AND PLAN:  1. Chronic atrial fibrillation/flutter  - rate controlled - continue Xarelto/Toprol  - check NOAC 2. Asymptomatic bradycardia - resolved 3. Moderate OSA  - His d/l today showed an AHI of 10.8/hr on 7cm H2O with 97% compliance in using more than 4 hours nightly.  He is sleeping on his back.  I will get a d/l in 4 weeks after trying to sleep more on his side. Patient has been using and benefiting from CPAP use and will  continue to benefit from therapy.  4. HTN - well controlled - continue Doxazosin/Metoprolol 5. Chronic LE edema - controlled on diuretics - check BMET 6. Chronic diastolic CHF - appears euvolemic on exam   Current medicines are reviewed at length with the patient today.  The patient does not have concerns regarding medicines.  The following changes have been made:  no change  Labs/ tests ordered today: See above Assessment and Plan No orders of the defined types were placed in this encounter.     Disposition:   FU with me in 6 months  Signed, Sueanne Margarita, MD  08/25/2015 8:43 AM    [facility] [place], [place], [place]  [address] Phone: [phone]; Fax: [phone]

## 2015-08-31 DIAGNOSIS — I10 Essential (primary) hypertension: Secondary | ICD-10-CM | POA: Diagnosis not present

## 2015-08-31 DIAGNOSIS — Z0000 Encounter for general adult medical examination without abnormal findings: Secondary | ICD-10-CM | POA: Diagnosis not present

## 2015-08-31 DIAGNOSIS — G629 Polyneuropathy, unspecified: Secondary | ICD-10-CM | POA: Diagnosis not present

## 2015-08-31 DIAGNOSIS — E538 Deficiency of other specified B group vitamins: Secondary | ICD-10-CM | POA: Diagnosis not present

## 2015-08-31 DIAGNOSIS — R7301 Impaired fasting glucose: Secondary | ICD-10-CM | POA: Diagnosis not present

## 2015-08-31 DIAGNOSIS — Z1389 Encounter for screening for other disorder: Secondary | ICD-10-CM | POA: Diagnosis not present

## 2015-08-31 DIAGNOSIS — D510 Vitamin B12 deficiency anemia due to intrinsic factor deficiency: Secondary | ICD-10-CM | POA: Diagnosis not present

## 2015-08-31 DIAGNOSIS — E781 Pure hyperglyceridemia: Secondary | ICD-10-CM | POA: Diagnosis not present

## 2015-08-31 DIAGNOSIS — I4891 Unspecified atrial fibrillation: Secondary | ICD-10-CM | POA: Diagnosis not present

## 2015-08-31 DIAGNOSIS — H409 Unspecified glaucoma: Secondary | ICD-10-CM | POA: Diagnosis not present

## 2015-09-02 ENCOUNTER — Encounter: Payer: Self-pay | Admitting: Cardiology

## 2015-09-07 ENCOUNTER — Encounter: Payer: Self-pay | Admitting: Cardiology

## 2015-09-10 ENCOUNTER — Other Ambulatory Visit: Payer: Self-pay | Admitting: Cardiology

## 2015-09-28 DIAGNOSIS — E538 Deficiency of other specified B group vitamins: Secondary | ICD-10-CM | POA: Diagnosis not present

## 2015-09-30 DIAGNOSIS — L603 Nail dystrophy: Secondary | ICD-10-CM | POA: Diagnosis not present

## 2015-09-30 DIAGNOSIS — I739 Peripheral vascular disease, unspecified: Secondary | ICD-10-CM | POA: Diagnosis not present

## 2015-09-30 DIAGNOSIS — L84 Corns and callosities: Secondary | ICD-10-CM | POA: Diagnosis not present

## 2015-10-15 ENCOUNTER — Other Ambulatory Visit: Payer: Self-pay | Admitting: Cardiology

## 2015-10-26 DIAGNOSIS — E538 Deficiency of other specified B group vitamins: Secondary | ICD-10-CM | POA: Diagnosis not present

## 2015-11-23 DIAGNOSIS — E538 Deficiency of other specified B group vitamins: Secondary | ICD-10-CM | POA: Diagnosis not present

## 2015-12-08 IMAGING — US IR CHOLECYSTOSTOMY
1 series · 2 of 2 positions shown · non-contrast
Comparison: None.

INDICATION: Acute cholecystitis.

[Series 1: sp perc cholecystostomy · 2 of 2 slices shown]
[im 1/2]
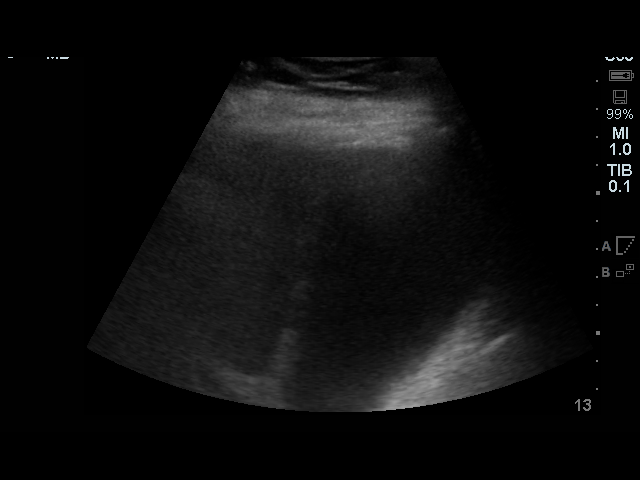
[im 2/2]
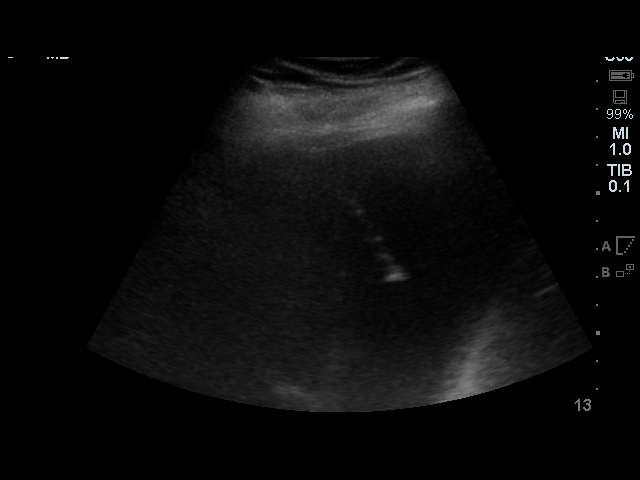

[2 of 2 positions shown; findings below may reference images not displayed]

The patient has been been prescribed is a relative 0, with the last
dose 09/02/2014. The family and the patient understand increased
bleeding risk, given that there has been only 48 hours off the
medication. The benefit of doing the procedure on this patient
hospitalized in the ICU outweighs the risk of the potential bleed.

EXAM:
ULTRASOUND AND FLUOROSCOPIC-GUIDED CHOLECYSTOSTOMY TUBE PLACEMENT
MEDICATIONS:
Fentanyl 100 mcg IV; Versed 2 mg IV;

250 mg Levaquin IV

Procedural

ANESTHESIA/SEDATION:
Total Moderate Sedation Time

Seventeen minutes

CONTRAST:  10mL OMNIPAQUE IOHEXOL 300 MG/ML  SOLN

FLUOROSCOPY TIME:  0 minutes minutes 48 seconds

COMPLICATIONS:
None

PROCEDURE:
Informed written consent was obtained from the patient after a
discussion of the risks, benefits and alternatives to treatment.
Questions regarding the procedure were encouraged and answered. A
timeout was performed prior to the initiation of the procedure.

The right upper abdominal quadrant was prepped and draped in the
usual sterile fashion, and a sterile drape was applied covering the
operative field. Maximum barrier sterile technique with sterile
gowns and gloves were used for the procedure. A timeout was
performed prior to the initiation of the procedure. Local anesthesia
was provided with 1% lidocaine with epinephrine.

Ultrasound scanning of the right upper quadrant demonstrates a
markedly dilated gallbladder. Of note, the patient reported pain
with ultrasound imaging over the gallbladder. Utilizing a
transhepatic approach, a 22 gauge needle was advanced into the
gallbladder under direct ultrasound guidance. An ultrasound image
was saved for documentation purposes. Appropriate intraluminal
puncture was confirmed with the efflux of bile and advancement of an
0.018 wire into the gallbladder lumen. The needle was exchanged for
an Accustick set. A small amount of contrast was injected to confirm
appropriate intraluminal positioning. Over a Benson wire, a
10.2-Rishab Malhi cholecystomy tube was advanced into the gallbladder
fossa, coiled and locked. Bile was aspirated and a small amount of
contrast was injected as several post procedural spot radiographic
images were obtained in various obliquities. The catheter was
secured to the skin with suture, connected to a drainage bag and a
dressing was placed. The patient tolerated the procedure well
without immediate post procedural complication.
IMPRESSION: Status post percutaneous cholecystostomy.

PLAN:
The patient became acutely combative and agitated during the
procedure. Hemodynamically, there was no hypotension, though the
patient was tachycardic. No drop in the oxygenation. This response
may have a then acute bacteremia/sepsis, or alternatively this may
be related to pain and paradoxical reaction to the sedation
medication.

The patient was transported directly to his bed in knee ICU, and
communication of the patient's change in mental status was
communicated to the ICU team in his primary physician.

Consideration may be for bacteremia/sepsis versus paradoxical
reaction to sedation.

Chest x-ray and correlation with cardiac markers may be considered.

## 2015-12-08 IMAGING — DX DG CHEST 1V PORT
2 series · 2 of 2 positions shown · non-contrast
Comparison: 09/02/2014

CLINICAL DATA: Acute respiratory distress. Personal history of
hypertension. Shortness of breath.

EXAM:
PORTABLE CHEST - 1 VIEW

[chest ap (1 of 2)]
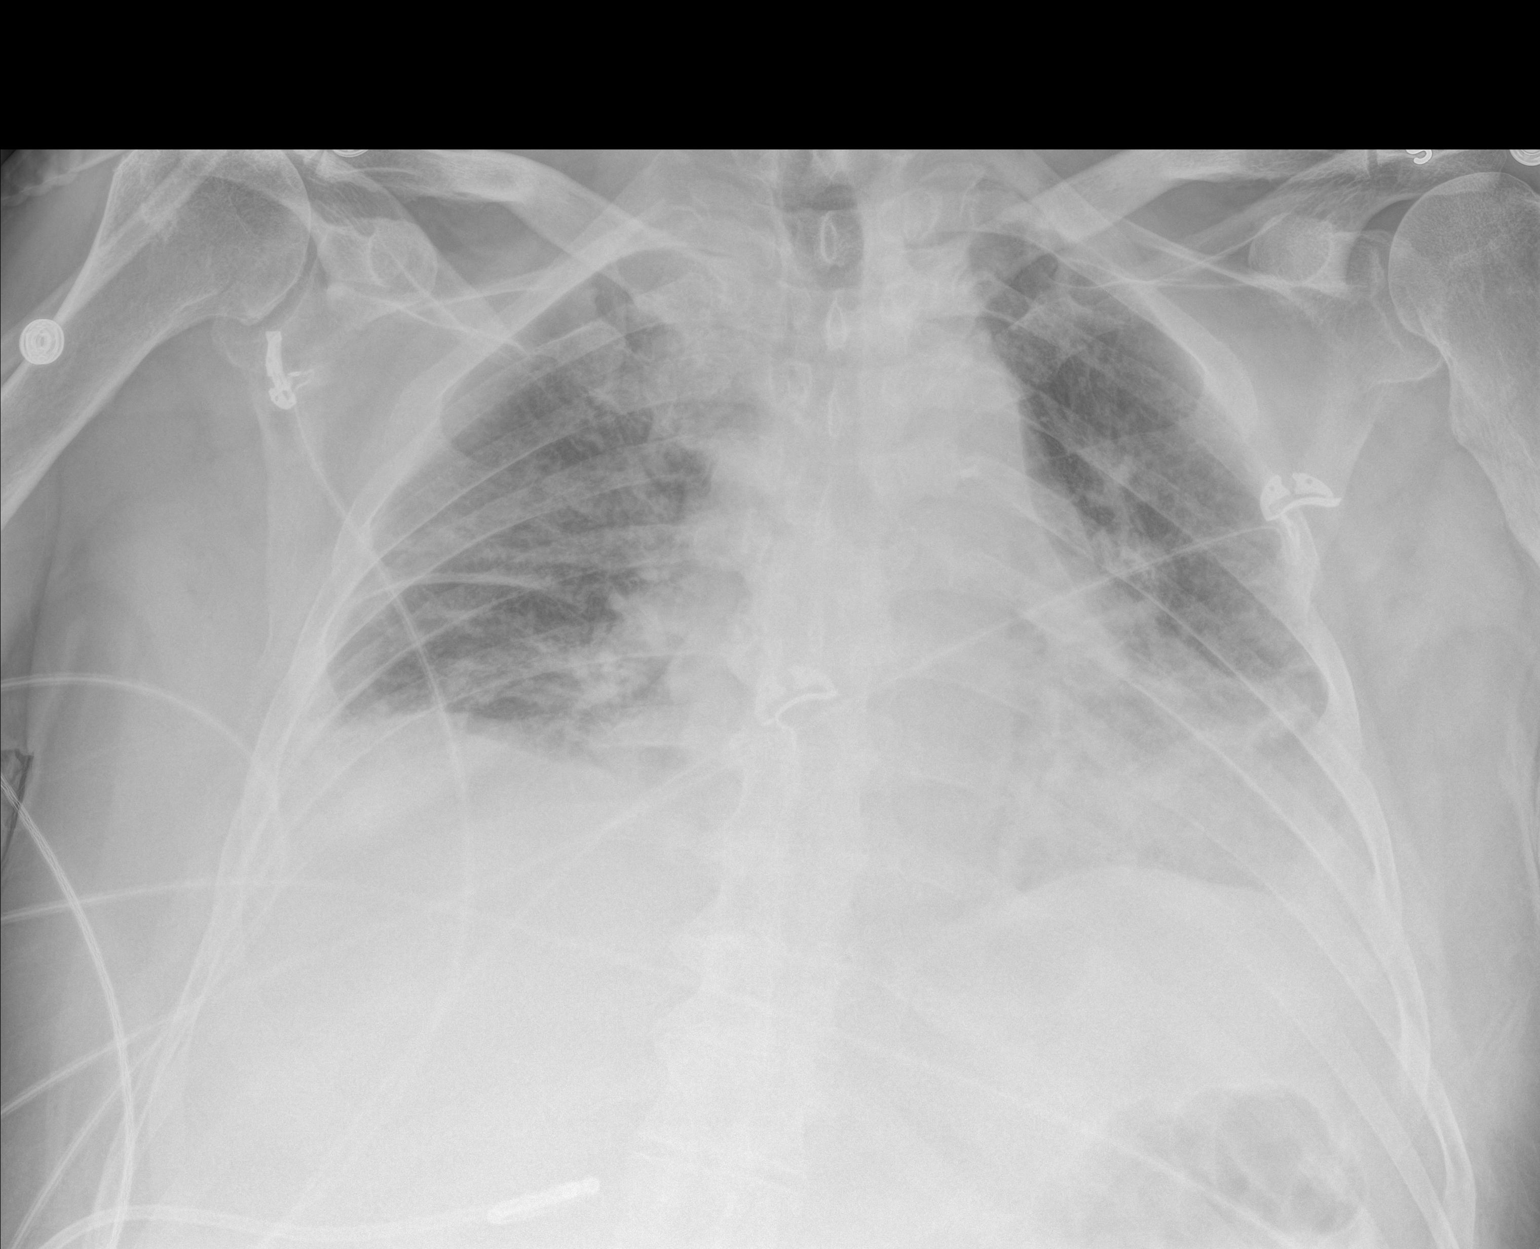

[chest ap (2 of 2)]
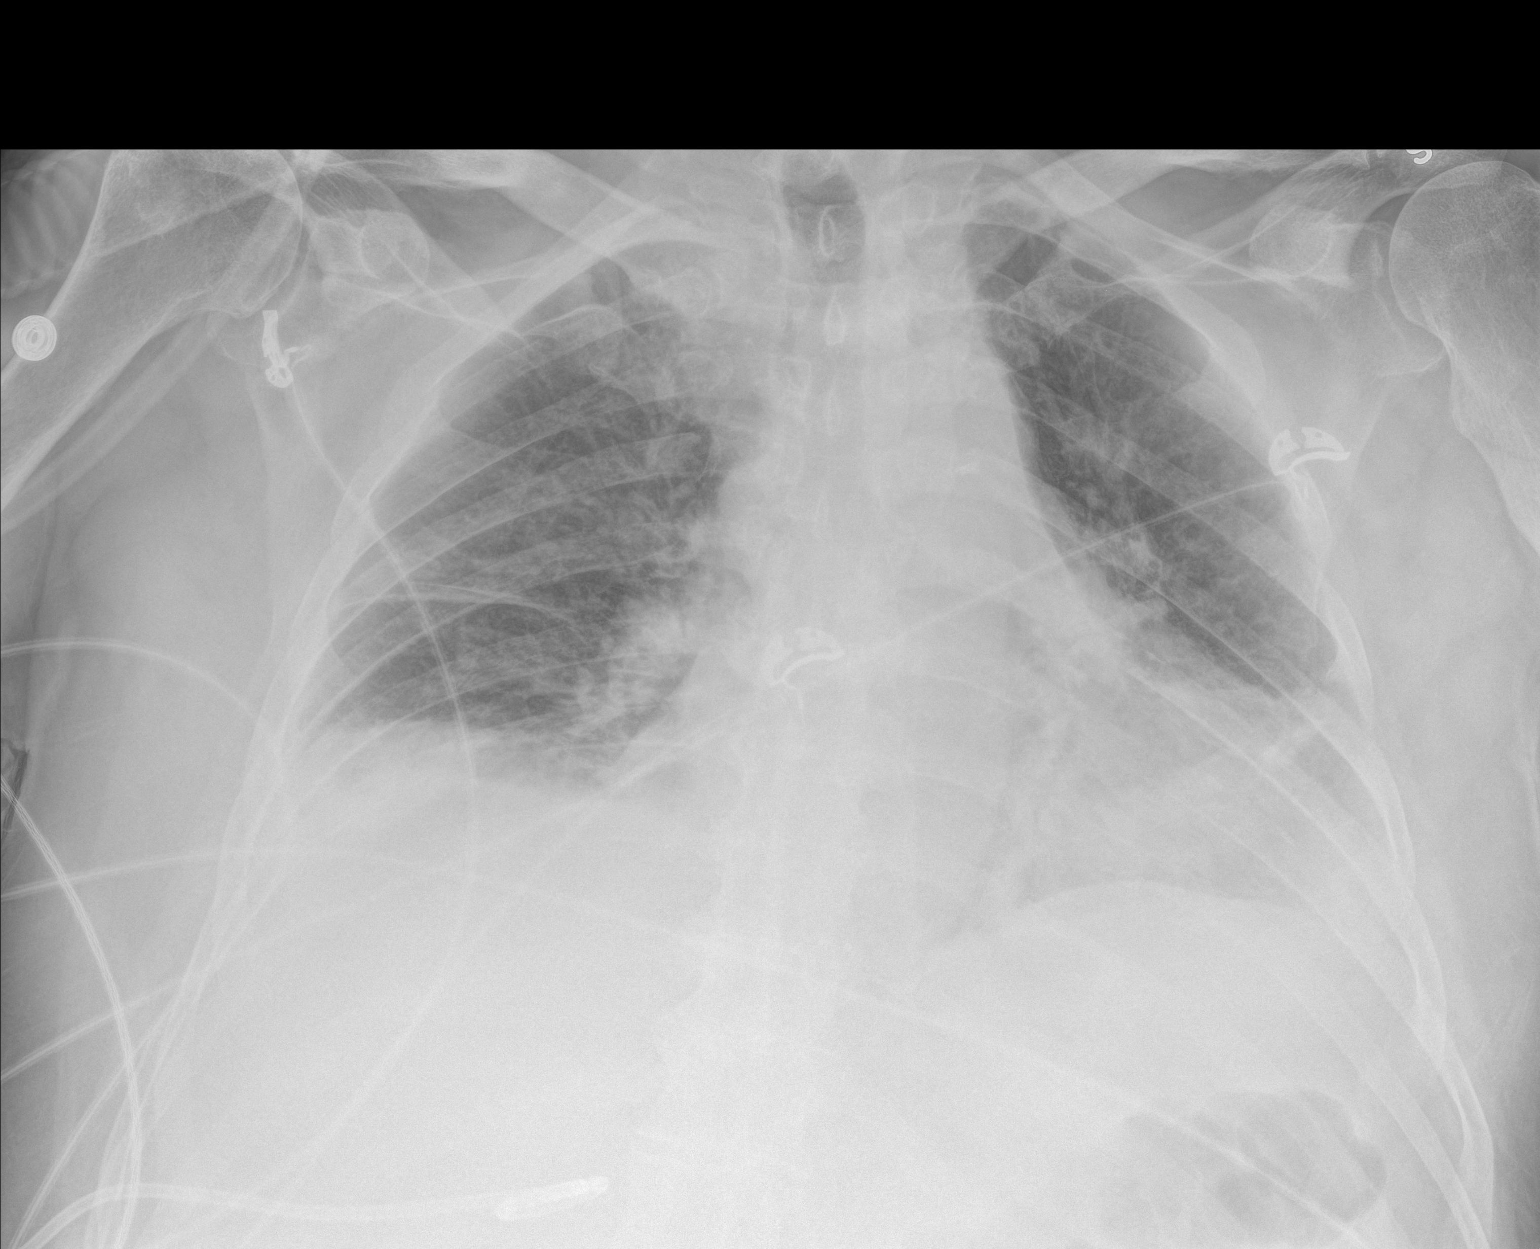

[2 of 2 positions shown; findings below may reference images not displayed]

FINDINGS: The heart is enlarged. There is interstitial and early alveolar
edema. [REDACTED] be a small amount of pleural fluid. No consolidation
or lobar collapse.
IMPRESSION: Pulmonary edema, more pronounced than on the previous study.

## 2015-12-16 DIAGNOSIS — L97529 Non-pressure chronic ulcer of other part of left foot with unspecified severity: Secondary | ICD-10-CM | POA: Diagnosis not present

## 2015-12-16 DIAGNOSIS — L97519 Non-pressure chronic ulcer of other part of right foot with unspecified severity: Secondary | ICD-10-CM | POA: Diagnosis not present

## 2015-12-16 DIAGNOSIS — L603 Nail dystrophy: Secondary | ICD-10-CM | POA: Diagnosis not present

## 2015-12-16 DIAGNOSIS — I739 Peripheral vascular disease, unspecified: Secondary | ICD-10-CM | POA: Diagnosis not present

## 2015-12-21 DIAGNOSIS — E538 Deficiency of other specified B group vitamins: Secondary | ICD-10-CM | POA: Diagnosis not present

## 2015-12-28 DIAGNOSIS — Z961 Presence of intraocular lens: Secondary | ICD-10-CM | POA: Diagnosis not present

## 2015-12-28 DIAGNOSIS — H401122 Primary open-angle glaucoma, left eye, moderate stage: Secondary | ICD-10-CM | POA: Diagnosis not present

## 2015-12-28 DIAGNOSIS — H401112 Primary open-angle glaucoma, right eye, moderate stage: Secondary | ICD-10-CM | POA: Diagnosis not present

## 2015-12-28 DIAGNOSIS — H1859 Other hereditary corneal dystrophies: Secondary | ICD-10-CM | POA: Diagnosis not present

## 2015-12-30 DIAGNOSIS — M2041 Other hammer toe(s) (acquired), right foot: Secondary | ICD-10-CM | POA: Diagnosis not present

## 2015-12-30 DIAGNOSIS — M2042 Other hammer toe(s) (acquired), left foot: Secondary | ICD-10-CM | POA: Diagnosis not present

## 2015-12-30 DIAGNOSIS — L97519 Non-pressure chronic ulcer of other part of right foot with unspecified severity: Secondary | ICD-10-CM | POA: Diagnosis not present

## 2016-01-08 ENCOUNTER — Other Ambulatory Visit: Payer: Self-pay | Admitting: Cardiology

## 2016-01-18 DIAGNOSIS — E538 Deficiency of other specified B group vitamins: Secondary | ICD-10-CM | POA: Diagnosis not present

## 2016-01-20 DIAGNOSIS — M71571 Other bursitis, not elsewhere classified, right ankle and foot: Secondary | ICD-10-CM | POA: Diagnosis not present

## 2016-01-20 DIAGNOSIS — M2041 Other hammer toe(s) (acquired), right foot: Secondary | ICD-10-CM | POA: Diagnosis not present

## 2016-02-03 DIAGNOSIS — M2042 Other hammer toe(s) (acquired), left foot: Secondary | ICD-10-CM | POA: Diagnosis not present

## 2016-02-03 DIAGNOSIS — M71572 Other bursitis, not elsewhere classified, left ankle and foot: Secondary | ICD-10-CM | POA: Diagnosis not present

## 2016-02-16 DIAGNOSIS — R7301 Impaired fasting glucose: Secondary | ICD-10-CM | POA: Diagnosis not present

## 2016-02-16 DIAGNOSIS — E538 Deficiency of other specified B group vitamins: Secondary | ICD-10-CM | POA: Diagnosis not present

## 2016-02-23 ENCOUNTER — Encounter: Payer: Self-pay | Admitting: Cardiology

## 2016-02-23 ENCOUNTER — Ambulatory Visit (INDEPENDENT_AMBULATORY_CARE_PROVIDER_SITE_OTHER): Payer: Medicare Other | Admitting: Cardiology

## 2016-02-23 VITALS — BP 152/84 | HR 86 | Ht 70.5 in | Wt 208.8 lb

## 2016-02-23 DIAGNOSIS — G4733 Obstructive sleep apnea (adult) (pediatric): Secondary | ICD-10-CM | POA: Diagnosis not present

## 2016-02-23 DIAGNOSIS — I10 Essential (primary) hypertension: Secondary | ICD-10-CM

## 2016-02-23 DIAGNOSIS — I5032 Chronic diastolic (congestive) heart failure: Secondary | ICD-10-CM | POA: Diagnosis not present

## 2016-02-23 DIAGNOSIS — I482 Chronic atrial fibrillation, unspecified: Secondary | ICD-10-CM

## 2016-02-23 DIAGNOSIS — I4891 Unspecified atrial fibrillation: Secondary | ICD-10-CM | POA: Diagnosis not present

## 2016-02-23 LAB — CBC WITH DIFFERENTIAL/PLATELET
BASOS ABS: 67 {cells}/uL (ref 0–200)
Basophils Relative: 1 %
Eosinophils Absolute: 134 cells/uL (ref 15–500)
Eosinophils Relative: 2 %
HCT: 42 % (ref 38.5–50.0)
Hemoglobin: 13.8 g/dL (ref 13.2–17.1)
Lymphocytes Relative: 23 %
Lymphs Abs: 1541 cells/uL (ref 850–3900)
MCH: 27.1 pg (ref 27.0–33.0)
MCHC: 32.9 g/dL (ref 32.0–36.0)
MCV: 82.5 fL (ref 80.0–100.0)
MONOS PCT: 9 %
MPV: 10 fL (ref 7.5–12.5)
Monocytes Absolute: 603 cells/uL (ref 200–950)
NEUTROS PCT: 65 %
Neutro Abs: 4355 cells/uL (ref 1500–7800)
PLATELETS: 170 10*3/uL (ref 140–400)
RBC: 5.09 MIL/uL (ref 4.20–5.80)
RDW: 15.7 % — AB (ref 11.0–15.0)
WBC: 6.7 10*3/uL (ref 3.8–10.8)

## 2016-02-23 LAB — BASIC METABOLIC PANEL
BUN: 16 mg/dL (ref 7–25)
CO2: 26 mmol/L (ref 20–31)
CREATININE: 1.19 mg/dL — AB (ref 0.70–1.18)
Calcium: 8.9 mg/dL (ref 8.6–10.3)
Chloride: 100 mmol/L (ref 98–110)
GLUCOSE: 136 mg/dL — AB (ref 65–99)
Potassium: 3.8 mmol/L (ref 3.5–5.3)
SODIUM: 138 mmol/L (ref 135–146)

## 2016-02-23 NOTE — Progress Notes (Signed)
Cardiology Office Note    Date:  02/23/2016   ID:  Cameron [name], DOB Jun 06, 1934, MRN [id number]  PCP:  Simona Huh, MD  Cardiologist:  Fransico Him, MD   Chief Complaint  Patient presents with  . Atrial Fibrillation  . Hypertension  . Congestive Heart Failure  . Sleep Apnea    History of Present Illness:  Cameron [name] is a 82 y.o. male with chronic atrial fibrillation, asymptomatic bradycardia, systemic anticoagulation and OSA who presents today for followup. He is doing well with his device. He feels rested in the am and has no daytime sleepiness. He tolerates his nasal pillow mask with chin strap without any problems and feels the pressure is adequate. He does not get up at night.He has occasional LLE edema at the ankle. He denies any palpitations, chest pain, SOB, DOE (except with extreme exertion in the heat) or dizziness.    Past Medical History  Diagnosis Date  . Rosacea   . Gout     in 70's, "not in toe but everywhere else"  . Vitamin B12 deficiency   . Arthritis   . Neuromuscular disorder ([place])   . Peripheral neuropathy (HCC)     both feet, can feel pain  . Hypertension   . Chronic anticoagulation   . Low back pain     when lying flat  . Anemia     hx of  . Swelling     both legs sometimes  . Chronic atrial fibrillation ([place])   . Acute cholecystitis 09/02/2014  . IFG (impaired fasting glucose) 09/05/2014  . Diabetes mellitus without complication (HCC)     borderline  . Chronic kidney disease 2016    January UTI  . GERD (gastroesophageal reflux disease)   . OSA (obstructive sleep apnea)     moderate with AHI 17.53/hr now on CPAP at 7cm H2O    Past Surgical History  Procedure Laterality Date  . Hemicolectomy  03/1996    with 3 lymph nodes  . Cardioversion      at least 5 times  . Appendectomy      1997  . Tumor removal Right 1961    back of leg  . Cataract extraction Bilateral   . Inguinal hernia repair Left 08/21/2013   Procedure:  LEFT INGUINAL HERNIA REPAIR WITH MESH;  Surgeon: Earnstine Regal, MD;  Location: [facility];  Service: General;  Laterality: Left;  . Insertion of mesh Left 08/21/2013    Procedure: INSERTION OF MESH;  Surgeon: Earnstine Regal, MD;  Location: [facility];  Service: General;  Laterality: Left;  . Cardioversion N/A 10/24/2013    Procedure: CARDIOVERSION;  Surgeon: Sueanne Margarita, MD;  [facility];  Service: Cardiovascular;  Laterality: N/A;  . Inguinal hernia repair Right 01/29/2014    Procedure:  REPAIR RIGHT INGUNIAL HERNIA WITH MESH;  Surgeon: Earnstine Regal, MD;  Location: [facility];  Service: General;  Laterality: Right;  . Hernia repair    . Eye surgery    . Colon surgery    . Cholecystectomy N/A 11/16/2014    Procedure: ATTEMPTED LAPAROSCOPIC CHOLECYSTECTOMY TO OPEN CHOLECYSTECTOMY WITH LYSIS OF ADHESIONS - 30MINUTES;  Surgeon: Armandina Gemma, MD;  Location: [facility];  Service: General;  Laterality: N/A;    Current Medications: Outpatient Prescriptions Prior to Visit  Medication Sig Dispense Refill  . bimatoprost (LUMIGAN) 0.01 % SOLN Place 1 drop into both eyes at bedtime.     . cyanocobalamin (,VITAMIN B-12,) 1000 MCG/ML  injection Inject 1,000 mcg into the muscle every 30 (thirty) days.     Marland Kitchen doxazosin (CARDURA) 4 MG tablet TAKE 1 TABLET BY MOUTH DAILY 90 tablet 3  . folic acid (FOLVITE) 1 MG tablet Take 1 mg by mouth daily.     . furosemide (LASIX) 40 MG tablet TAKE 1 TABLET BY MOUTH DAILY. TAKE AN EXTRA ONE DAILY AS NEEDED FOR SWELLING IN LOWER EXTREMITIES 135 tablet 0  . meloxicam (MOBIC) 15 MG tablet Take 15 mg by mouth daily as needed for pain.     . metoprolol succinate (TOPROL-XL) 25 MG 24 hr tablet TAKE 1 TABLET(25 MG) BY MOUTH EVERY MORNING 90 tablet 3  . rivaroxaban (XARELTO) 20 MG TABS tablet Take 1 tablet (20 mg total) by mouth daily with supper. 90 tablet 2  . traMADol (ULTRAM) 50 MG tablet Take 50 mg by mouth every 4 (four) hours as needed for moderate pain or severe pain.  Maximum dose= 8 tablets per day     No facility-administered medications prior to visit.     Allergies:   Ace inhibitors and Clonidine derivatives   Social History   Social History  . Marital Status: Widowed    Spouse Name: N/A  . Number of Children: N/A  . Years of Education: N/A   Occupational History  . Retired     Social History Main Topics  . Smoking status: Former Smoker -- 2.00 packs/day for 20 years    Types: Cigarettes    Quit date: 10/02/1974  . Smokeless tobacco: Never Used  . Alcohol Use: 0.0 oz/week    0 Standard drinks or equivalent per week     Comment: 1-2 beers per day  . Drug Use: No  . Sexual Activity: Not Asked   Other Topics Concern  . None   Social History Narrative   Pt lives in [place] with spouse.   Retired in 1999 from the [employer].  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           Family History:  The patient's family history includes Allergies in his daughter; Asthma in his daughter; Cancer in his father; Dementia in his sister; Stroke (age of onset: 26) in his mother.   ROS:   Please see the history of present illness.    ROS All other systems reviewed and are negative.   PHYSICAL EXAM:   VS:  BP 152/84 mmHg  Pulse 86  Ht 5' 10.5" (1.791 m)  Wt 208 lb 12.8 oz (94.711 kg)  BMI 29.53 kg/m2   GEN: Well nourished, well developed, in no acute distress HEENT: normal Neck: no JVD, carotid bruits, or masses Cardiac: RRR; no murmurs, rubs, or gallops,no edema.  Intact distal pulses bilaterally.  Respiratory:  clear to auscultation bilaterally, normal work of breathing GI: soft, nontender, nondistended, + BS MS: no deformity or atrophy Skin: warm and dry, no rash Neuro:  Alert and Oriented x 3, Strength and sensation are intact Psych: euthymic mood, full affect  Wt Readings from Last 3 Encounters:  02/23/16 208 lb 12.8 oz (94.711 kg)  08/25/15 212 lb 3.2 oz (96.253 kg)    02/25/15 207 lb (93.895 kg)      Studies/Labs Reviewed:   EKG:  EKG is not ordered today.    Recent Labs: 08/25/2015: BUN 17; Creat 1.05; Hemoglobin 13.1; Platelets 158; Potassium 3.7; Sodium 141   Lipid Panel No results found for: CHOL, TRIG, HDL, CHOLHDL, VLDL, LDLCALC,  LDLDIRECT  Additional studies/ records that were reviewed today include:  none    ASSESSMENT:    1. Chronic atrial fibrillation ([place])   2. Essential hypertension   3. Chronic diastolic heart failure ([place])   4. OSA (obstructive sleep apnea)      PLAN:  In order of problems listed above:  1. Chronic atrial fibrillation with controlled rate.  Continue BB and Xarelto.  Check NOAC panel. 2. HTN - BP borderline controlled on current medical regimen. At home his BP runs 135/57mmHg.  Continue BB and doxazosin.  3. Chronic diastolic CHF - appears euvolemic on exam.  Continue BB and diuretic.  4. OSA - tolerating CPAP well without any problems. I will get a d/l from his DME    Medication Adjustments/Labs and Tests Ordered: Current medicines are reviewed at length with the patient today.  Concerns regarding medicines are outlined above.  Medication changes, Labs and Tests ordered today are listed in the Patient Instructions below.  There are no Patient Instructions on file for this visit.   Signed, Fransico Him, MD  02/23/2016 9:27 AM    [address], [place], [place]  [address] Phone: [phone]; Fax: [phone]

## 2016-02-23 NOTE — Patient Instructions (Signed)
Medication Instructions:  Your physician recommends that you continue on your current medications as directed. Please refer to the Current Medication list given to you today.   Labwork: TODAY: BMET, CBC  Testing/Procedures: None  Follow-Up: Your physician wants you to follow-up in: 1 year with Dr. Turner. You will receive a reminder letter in the mail two months in advance. If you don't receive a letter, please call our office to schedule the follow-up appointment.   Any Other Special Instructions Will Be Listed Below (If Applicable).     If you need a refill on your cardiac medications before your next appointment, please call your pharmacy.   

## 2016-02-25 DIAGNOSIS — L603 Nail dystrophy: Secondary | ICD-10-CM | POA: Diagnosis not present

## 2016-02-25 DIAGNOSIS — I739 Peripheral vascular disease, unspecified: Secondary | ICD-10-CM | POA: Diagnosis not present

## 2016-02-25 DIAGNOSIS — L84 Corns and callosities: Secondary | ICD-10-CM | POA: Diagnosis not present

## 2016-03-17 DIAGNOSIS — E538 Deficiency of other specified B group vitamins: Secondary | ICD-10-CM | POA: Diagnosis not present

## 2016-04-14 DIAGNOSIS — E538 Deficiency of other specified B group vitamins: Secondary | ICD-10-CM | POA: Diagnosis not present

## 2016-05-12 DIAGNOSIS — E538 Deficiency of other specified B group vitamins: Secondary | ICD-10-CM | POA: Diagnosis not present

## 2016-05-17 DIAGNOSIS — I739 Peripheral vascular disease, unspecified: Secondary | ICD-10-CM | POA: Diagnosis not present

## 2016-05-17 DIAGNOSIS — L84 Corns and callosities: Secondary | ICD-10-CM | POA: Diagnosis not present

## 2016-05-17 DIAGNOSIS — L603 Nail dystrophy: Secondary | ICD-10-CM | POA: Diagnosis not present

## 2016-05-30 DIAGNOSIS — H401112 Primary open-angle glaucoma, right eye, moderate stage: Secondary | ICD-10-CM | POA: Diagnosis not present

## 2016-05-30 DIAGNOSIS — H401122 Primary open-angle glaucoma, left eye, moderate stage: Secondary | ICD-10-CM | POA: Diagnosis not present

## 2016-06-12 DIAGNOSIS — E538 Deficiency of other specified B group vitamins: Secondary | ICD-10-CM | POA: Diagnosis not present

## 2016-06-12 DIAGNOSIS — Z23 Encounter for immunization: Secondary | ICD-10-CM | POA: Diagnosis not present

## 2016-07-03 ENCOUNTER — Other Ambulatory Visit: Payer: Self-pay | Admitting: Cardiology

## 2016-07-04 ENCOUNTER — Other Ambulatory Visit: Payer: Self-pay

## 2016-07-04 MED ORDER — FUROSEMIDE 40 MG PO TABS
ORAL_TABLET | ORAL | 0 refills | Status: DC
Start: 2016-07-04 — End: 2016-11-07

## 2016-07-07 ENCOUNTER — Other Ambulatory Visit: Payer: Self-pay | Admitting: Cardiology

## 2016-07-10 DIAGNOSIS — E538 Deficiency of other specified B group vitamins: Secondary | ICD-10-CM | POA: Diagnosis not present

## 2016-08-10 DIAGNOSIS — E538 Deficiency of other specified B group vitamins: Secondary | ICD-10-CM | POA: Diagnosis not present

## 2016-08-11 DIAGNOSIS — I739 Peripheral vascular disease, unspecified: Secondary | ICD-10-CM | POA: Diagnosis not present

## 2016-08-11 DIAGNOSIS — L84 Corns and callosities: Secondary | ICD-10-CM | POA: Diagnosis not present

## 2016-08-11 DIAGNOSIS — L603 Nail dystrophy: Secondary | ICD-10-CM | POA: Diagnosis not present

## 2016-09-07 DIAGNOSIS — E538 Deficiency of other specified B group vitamins: Secondary | ICD-10-CM | POA: Diagnosis not present

## 2016-09-26 DIAGNOSIS — E781 Pure hyperglyceridemia: Secondary | ICD-10-CM | POA: Diagnosis not present

## 2016-09-26 DIAGNOSIS — R7301 Impaired fasting glucose: Secondary | ICD-10-CM | POA: Diagnosis not present

## 2016-09-26 DIAGNOSIS — D510 Vitamin B12 deficiency anemia due to intrinsic factor deficiency: Secondary | ICD-10-CM | POA: Diagnosis not present

## 2016-09-26 DIAGNOSIS — I4891 Unspecified atrial fibrillation: Secondary | ICD-10-CM | POA: Diagnosis not present

## 2016-09-26 DIAGNOSIS — H409 Unspecified glaucoma: Secondary | ICD-10-CM | POA: Diagnosis not present

## 2016-09-26 DIAGNOSIS — Z1389 Encounter for screening for other disorder: Secondary | ICD-10-CM | POA: Diagnosis not present

## 2016-09-26 DIAGNOSIS — G629 Polyneuropathy, unspecified: Secondary | ICD-10-CM | POA: Diagnosis not present

## 2016-09-26 DIAGNOSIS — I10 Essential (primary) hypertension: Secondary | ICD-10-CM | POA: Diagnosis not present

## 2016-09-26 DIAGNOSIS — Z0000 Encounter for general adult medical examination without abnormal findings: Secondary | ICD-10-CM | POA: Diagnosis not present

## 2016-09-26 DIAGNOSIS — E538 Deficiency of other specified B group vitamins: Secondary | ICD-10-CM | POA: Diagnosis not present

## 2016-09-28 ENCOUNTER — Other Ambulatory Visit: Payer: Self-pay | Admitting: Cardiology

## 2016-10-10 DIAGNOSIS — E538 Deficiency of other specified B group vitamins: Secondary | ICD-10-CM | POA: Diagnosis not present

## 2016-10-16 DIAGNOSIS — D696 Thrombocytopenia, unspecified: Secondary | ICD-10-CM | POA: Diagnosis not present

## 2016-10-16 DIAGNOSIS — E119 Type 2 diabetes mellitus without complications: Secondary | ICD-10-CM | POA: Diagnosis not present

## 2016-10-25 DIAGNOSIS — L84 Corns and callosities: Secondary | ICD-10-CM | POA: Diagnosis not present

## 2016-10-25 DIAGNOSIS — L603 Nail dystrophy: Secondary | ICD-10-CM | POA: Diagnosis not present

## 2016-10-25 DIAGNOSIS — I739 Peripheral vascular disease, unspecified: Secondary | ICD-10-CM | POA: Diagnosis not present

## 2016-10-31 DIAGNOSIS — H52203 Unspecified astigmatism, bilateral: Secondary | ICD-10-CM | POA: Diagnosis not present

## 2016-10-31 DIAGNOSIS — H401112 Primary open-angle glaucoma, right eye, moderate stage: Secondary | ICD-10-CM | POA: Diagnosis not present

## 2016-10-31 DIAGNOSIS — H1859 Other hereditary corneal dystrophies: Secondary | ICD-10-CM | POA: Diagnosis not present

## 2016-10-31 DIAGNOSIS — H401122 Primary open-angle glaucoma, left eye, moderate stage: Secondary | ICD-10-CM | POA: Diagnosis not present

## 2016-11-07 ENCOUNTER — Other Ambulatory Visit: Payer: Self-pay | Admitting: Cardiology

## 2016-11-07 MED ORDER — FUROSEMIDE 40 MG PO TABS: ORAL_TABLET | ORAL | 0 refills | Status: DC

## 2016-11-08 NOTE — Telephone Encounter (Signed)
Medication Detail    Disp Refills Start End   furosemide (LASIX) 40 MG tablet 135 tablet 0 11/07/2016    Sig: TAKE 1 TABLET BY MOUTH DAILY. TAKE AN EXTRA ONE DAILY AS NEEDED FOR SWELLING IN LOWER EXTREMITIES   Notes to Pharmacy: Pt needs to be seen in May   E-Prescribing Status: Receipt confirmed by pharmacy (11/07/2016 12:45 PM EST)   Pharmacy   [id number] - [place], [place] - [address] AT [place]

## 2016-11-10 DIAGNOSIS — E538 Deficiency of other specified B group vitamins: Secondary | ICD-10-CM | POA: Diagnosis not present

## 2016-12-08 DIAGNOSIS — E538 Deficiency of other specified B group vitamins: Secondary | ICD-10-CM | POA: Diagnosis not present

## 2017-01-01 ENCOUNTER — Other Ambulatory Visit: Payer: Self-pay | Admitting: Cardiology

## 2017-01-01 NOTE — Telephone Encounter (Signed)
Pt last saw Dr Radford Pax 02/23/16, pt has follow-up appt with Dr Radford Pax scheduled for 02/27/17.  Last labs 02/23/16 Creat 1.19, Age 83, weight 94.7kg, pt's CrCl is 66.32.  Based on pt's CrCl 66.32 pt is on appropriate dosage of Xarelto 20mg  QD.  Pt will need CBC and BMP rechecked at f/u appt with Dr Radford Pax 02/27/17.  Will refill rx.

## 2017-01-08 DIAGNOSIS — E538 Deficiency of other specified B group vitamins: Secondary | ICD-10-CM | POA: Diagnosis not present

## 2017-01-08 DIAGNOSIS — Z23 Encounter for immunization: Secondary | ICD-10-CM | POA: Diagnosis not present

## 2017-01-10 DIAGNOSIS — L84 Corns and callosities: Secondary | ICD-10-CM | POA: Diagnosis not present

## 2017-01-10 DIAGNOSIS — L603 Nail dystrophy: Secondary | ICD-10-CM | POA: Diagnosis not present

## 2017-01-10 DIAGNOSIS — I739 Peripheral vascular disease, unspecified: Secondary | ICD-10-CM | POA: Diagnosis not present

## 2017-01-30 DIAGNOSIS — D510 Vitamin B12 deficiency anemia due to intrinsic factor deficiency: Secondary | ICD-10-CM | POA: Diagnosis not present

## 2017-02-07 ENCOUNTER — Encounter: Payer: Self-pay | Admitting: Cardiology

## 2017-02-20 ENCOUNTER — Encounter: Payer: Self-pay | Admitting: Cardiology

## 2017-02-25 ENCOUNTER — Encounter: Payer: Self-pay | Admitting: Cardiology

## 2017-02-25 NOTE — Progress Notes (Deleted)
Cardiology Office Note    Date:  02/25/2017   ID:  Cameron [name], DOB 12-17-1933, MRN [id number]  PCP:  Gaynelle Arabian, MD  Cardiologist:  Fransico Him, MD   Chief Complaint  Patient presents with  . Atrial Fibrillation  . Sleep Apnea  . Hypertension    History of Present Illness:  Cameron [name] is a 83 y.o. male with permanent atrial fibrillation, HTN, chronic diastolic CHF, asymptomatic bradycardia, systemic anticoagulation and OSA.  He is here for followup today and is doing well. He is doing well with his CPAP device and feels rested in the am and has no daytime sleepiness. He tolerates his nasal pillow mask with chin strap without any problems and feels the pressure is adequate. He sleeps well at night.He has occasional chronic LLE edema at the ankle which is stable. He denies any palpitations, chest pain, SOB, DOE (except with extreme exertion in the heat) or dizziness, PND or orthopnea.  Past Medical History:  Diagnosis Date  . Acute cholecystitis 09/02/2014  . Anemia    hx of  . Arthritis   . Chronic anticoagulation   . Chronic atrial fibrillation ([place])   . Chronic kidney disease 2016   January UTI  . Diabetes mellitus without complication (HCC)    borderline  . GERD (gastroesophageal reflux disease)   . Gout    in 70's, "not in toe but everywhere else"  . Hypertension   . IFG (impaired fasting glucose) 09/05/2014  . Low back pain    when lying flat  . Neuromuscular disorder ([place])   . OSA (obstructive sleep apnea)    moderate with AHI 17.53/hr now on CPAP at 7cm H2O  . Peripheral neuropathy    both feet, can feel pain  . Rosacea   . Swelling    both legs sometimes  . Vitamin B12 deficiency     Past Surgical History:  Procedure Laterality Date  . APPENDECTOMY     1997  . CARDIOVERSION     at least 5 times  . CARDIOVERSION N/A 10/24/2013   Procedure: CARDIOVERSION;  Surgeon: Sueanne Margarita, MD;  Location: [facility];  Service: Cardiovascular;   Laterality: N/A;  . CATARACT EXTRACTION Bilateral   . CHOLECYSTECTOMY N/A 11/16/2014   Procedure: ATTEMPTED LAPAROSCOPIC CHOLECYSTECTOMY TO OPEN CHOLECYSTECTOMY WITH LYSIS OF ADHESIONS - 30MINUTES;  Surgeon: Armandina Gemma, MD;  Location: [facility];  Service: General;  Laterality: N/A;  . COLON SURGERY    . EYE SURGERY    . HEMICOLECTOMY  03/1996   with 3 lymph nodes  . HERNIA REPAIR    . INGUINAL HERNIA REPAIR Left 08/21/2013   Procedure:  LEFT INGUINAL HERNIA REPAIR WITH MESH;  Surgeon: Earnstine Regal, MD;  [facility];  Service: General;  Laterality: Left;  . INGUINAL HERNIA REPAIR Right 01/29/2014   Procedure:  REPAIR RIGHT INGUNIAL HERNIA WITH MESH;  Surgeon: Earnstine Regal, MD;  [facility];  Service: General;  Laterality: Right;  . INSERTION OF MESH Left 08/21/2013   Procedure: INSERTION OF MESH;  Surgeon: Earnstine Regal, MD;  [facility];  Service: General;  Laterality: Left;  . TUMOR REMOVAL Right 1961   back of leg    Current Medications: No outpatient prescriptions have been marked as taking for the 02/27/17 encounter (Office Visit) with Sueanne Margarita, MD.    Allergies:   Ace inhibitors and Clonidine derivatives   Social History   Social History  . Marital status:  Widowed    Spouse name: N/A  . Number of children: N/A  . Years of education: N/A   Occupational History  . Retired     Social History Main Topics  . Smoking status: Former Smoker    Packs/day: 2.00    Years: 20.00    Types: Cigarettes    Quit date: 10/02/1974  . Smokeless tobacco: Never Used  . Alcohol use 0.0 oz/week     Comment: 1-2 beers per day  . Drug use: No  . Sexual activity: Not Asked   Other Topics Concern  . None   Social History Narrative   Pt lives in [place] with spouse.   Retired in 1999 from the [employer].  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           Family History:  The patient's family history includes  Allergies in his daughter; Asthma in his daughter; Cancer in his father; Dementia in his sister; Stroke (age of onset: 52) in his mother.   ROS:   Please see the history of present illness.    ROS All other systems reviewed and are negative.  No flowsheet data found.     PHYSICAL EXAM:   VS:  There were no vitals taken for this visit.   GEN: Well nourished, well developed, in no acute distress  HEENT: normal  Neck: no JVD, carotid bruits, or masses Cardiac: irregulalry irregular no murmurs, rubs, or gallops,no edema.  Intact distal pulses bilaterally.  Respiratory:  clear to auscultation bilaterally, normal work of breathing GI: soft, nontender, nondistended, + BS MS: no deformity or atrophy  Skin: warm and dry, no rash Neuro:  Alert and Oriented x 3, Strength and sensation are intact Psych: euthymic mood, full affect  Wt Readings from Last 3 Encounters:  02/23/16 208 lb 12.8 oz (94.7 kg)  08/25/15 212 lb 3.2 oz (96.3 kg)  02/25/15 207 lb (93.9 kg)      Studies/Labs Reviewed:   EKG:  EKG is ordered today.  The ekg ordered today demonstrates ***  Recent Labs: No results found for requested labs within last 8760 hours.   Lipid Panel No results found for: CHOL, TRIG, HDL, CHOLHDL, VLDL, LDLCALC, LDLDIRECT  Additional studies/ records that were reviewed today include:  CPAP download    ASSESSMENT:    1. OSA (obstructive sleep apnea)   2. Permanent atrial fibrillation ([place])   3. Essential hypertension   4. Chronic diastolic heart failure ([place])   5. Chronic diastolic (congestive) heart failure (HCC)      PLAN:  In order of problems listed above:  OSA - the patient is tolerating PAP therapy well without any problems. The PAP download was reviewed today and showed an AHI of ***/hr on *** cm H2O with ***% compliance in using more than 4 hours nightly.  The patient has been using and benefiting from CPAP use and will continue to benefit from therapy.  Permanent  atrial fibrillation - he is rate controlled on Toprol 25mg  daily which he will continue along with Xarelto.  I will check a BMET and CBC. HTN - His BP is adequately controlled on current dose of BB and cardura 4mg  daily Chronic diastolic CHF - he appears euvolemic on exam today.  He will continue on Lasix 40mg  daily and SS for LE edema.       Medication Adjustments/Labs and Tests Ordered: Current medicines are reviewed at length with the patient today.  Concerns regarding medicines are outlined above.  Medication changes, Labs and Tests ordered today are listed in the Patient Instructions below.  There are no Patient Instructions on file for this visit.   Signed, Fransico Him, MD  02/25/2017 9:44 PM    [address], [place], [place]  [address] Phone: [phone]; Fax: [phone]

## 2017-02-27 ENCOUNTER — Encounter: Payer: Medicare Other | Admitting: Cardiology

## 2017-02-28 NOTE — Progress Notes (Signed)
This encounter was created in error - please disregard.

## 2017-03-03 ENCOUNTER — Encounter: Payer: Self-pay | Admitting: Cardiology

## 2017-03-09 ENCOUNTER — Telehealth: Payer: Self-pay | Admitting: *Deleted

## 2017-03-09 ENCOUNTER — Ambulatory Visit (INDEPENDENT_AMBULATORY_CARE_PROVIDER_SITE_OTHER): Payer: Medicare Other | Admitting: Cardiology

## 2017-03-09 ENCOUNTER — Encounter: Payer: Self-pay | Admitting: Cardiology

## 2017-03-09 VITALS — BP 140/70 | HR 80 | Ht 70.0 in | Wt 203.8 lb

## 2017-03-09 DIAGNOSIS — I10 Essential (primary) hypertension: Secondary | ICD-10-CM

## 2017-03-09 DIAGNOSIS — I482 Chronic atrial fibrillation: Secondary | ICD-10-CM | POA: Diagnosis not present

## 2017-03-09 DIAGNOSIS — E538 Deficiency of other specified B group vitamins: Secondary | ICD-10-CM | POA: Diagnosis not present

## 2017-03-09 DIAGNOSIS — I5032 Chronic diastolic (congestive) heart failure: Secondary | ICD-10-CM

## 2017-03-09 DIAGNOSIS — D510 Vitamin B12 deficiency anemia due to intrinsic factor deficiency: Secondary | ICD-10-CM | POA: Diagnosis not present

## 2017-03-09 DIAGNOSIS — G4733 Obstructive sleep apnea (adult) (pediatric): Secondary | ICD-10-CM | POA: Diagnosis not present

## 2017-03-09 DIAGNOSIS — I4821 Permanent atrial fibrillation: Secondary | ICD-10-CM

## 2017-03-09 LAB — CBC WITH DIFFERENTIAL/PLATELET
Basophils Absolute: 0 10*3/uL (ref 0.0–0.2)
Basos: 0 %
EOS (ABSOLUTE): 0.2 10*3/uL (ref 0.0–0.4)
EOS: 3 %
HEMATOCRIT: 38.2 % (ref 37.5–51.0)
HEMOGLOBIN: 12.9 g/dL — AB (ref 13.0–17.7)
IMMATURE GRANS (ABS): 0 10*3/uL (ref 0.0–0.1)
IMMATURE GRANULOCYTES: 0 %
Lymphocytes Absolute: 1.3 10*3/uL (ref 0.7–3.1)
Lymphs: 19 %
MCH: 28 pg (ref 26.6–33.0)
MCHC: 33.8 g/dL (ref 31.5–35.7)
MCV: 83 fL (ref 79–97)
Monocytes Absolute: 0.5 10*3/uL (ref 0.1–0.9)
Monocytes: 7 %
Neutrophils Absolute: 4.8 10*3/uL (ref 1.4–7.0)
Neutrophils: 71 %
Platelets: 139 10*3/uL — ABNORMAL LOW (ref 150–379)
RBC: 4.61 x10E6/uL (ref 4.14–5.80)
RDW: 16 % — ABNORMAL HIGH (ref 12.3–15.4)
WBC: 6.9 10*3/uL (ref 3.4–10.8)

## 2017-03-09 LAB — BASIC METABOLIC PANEL
BUN / CREAT RATIO: 18 (ref 10–24)
BUN: 20 mg/dL (ref 8–27)
CO2: 24 mmol/L (ref 18–29)
CREATININE: 1.12 mg/dL (ref 0.76–1.27)
Calcium: 8.9 mg/dL (ref 8.6–10.2)
Chloride: 102 mmol/L (ref 96–106)
GFR calc Af Amer: 71 mL/min/{1.73_m2} (ref >59)
GFR calc non Af Amer: 62 mL/min/{1.73_m2} (ref >59)
GLUCOSE: 123 mg/dL — AB (ref 65–99)
Potassium: 3.6 mmol/L (ref 3.5–5.2)
Sodium: 142 mmol/L (ref 134–144)

## 2017-03-09 MED ORDER — XARELTO 20 MG PO TABS: 20.0000 mg | ORAL_TABLET | Freq: Every day | ORAL | 3 refills | Status: DC

## 2017-03-09 MED ORDER — FUROSEMIDE 40 MG PO TABS: ORAL_TABLET | ORAL | 3 refills | Status: DC

## 2017-03-09 NOTE — Telephone Encounter (Signed)
-----   Message from Jiles Crocker sent at 03/09/2017  2:35 PM EDT ----- Regarding: RE: DME orders Got it!  Thank you! ----- Message ----- From: Freada Bergeron, CMA Sent: 03/09/2017   1:15 PM To: Jiles Crocker, Darlina Guys Subject: FW: DME orders                                   ----- Message ----- From: Theodoro Parma, RN Sent: 03/09/2017  10:05 AM To: Jiles Crocker, Freada Bergeron, CMA Subject: DME orders                                     Order placed for new mask then auto-titration  Nina-keep an eye out for the results in a couple weeks  Thank you!

## 2017-03-09 NOTE — Progress Notes (Signed)
Cardiology Office Note    Date:  03/09/2017   ID:  Cameron [name], DOB 1934-06-23, MRN [id number]  PCP:  Gaynelle Arabian, MD  Cardiologist:  Fransico Him, MD   Chief Complaint  Patient presents with  . Atrial Fibrillation  . Hypertension  . Sleep Apnea  . Congestive Heart Failure    History of Present Illness:  Cameron [name] is a 83 y.o. male with permanent atrial fibrillation, asymptomatic bradycardia, systemic anticoagulation and OSA who presents today for followup. He is doing well with his CPAP device. He continues to feel rested in the am and has no daytime sleepiness. He tolerates his nasal pillow mask with chin strap but it is now leaking and thinks he needs a smaller mask.  He feel the pressure is adequate. He sleeps well at night.He has occasional LLE edema at the ankle but resolves with additional Lasix. He denies any palpitations, syncope, chest pain or pressure, PND, orthopnea, SOB, DOE (except with extreme exertion in the heat) or dizziness.  He mows the yard twice weekly without any problems.   Past Medical History:  Diagnosis Date  . Acute cholecystitis 09/02/2014  . Anemia    hx of  . Arthritis   . Chronic anticoagulation   . Chronic diastolic (congestive) heart failure ([place])   . Chronic kidney disease 2016   January UTI  . Diabetes mellitus without complication (HCC)    borderline  . GERD (gastroesophageal reflux disease)   . Gout    in 70's, "not in toe but everywhere else"  . Hypertension   . IFG (impaired fasting glucose) 09/05/2014  . Low back pain    when lying flat  . Neuromuscular disorder ([place])   . OSA (obstructive sleep apnea)    moderate with AHI 17.53/hr now on CPAP at 7cm H2O  . Peripheral neuropathy    both feet, can feel pain  . Permanent atrial fibrillation ([place])   . Rosacea   . Swelling    both legs sometimes  . Vitamin B12 deficiency     Past Surgical History:  Procedure Laterality Date  . APPENDECTOMY     1997  .  CARDIOVERSION     at least 5 times  . CARDIOVERSION N/A 10/24/2013   Procedure: CARDIOVERSION;  Surgeon: Sueanne Margarita, MD;  Location: [facility];  Service: Cardiovascular;  Laterality: N/A;  . CATARACT EXTRACTION Bilateral   . CHOLECYSTECTOMY N/A 11/16/2014   Procedure: ATTEMPTED LAPAROSCOPIC CHOLECYSTECTOMY TO OPEN CHOLECYSTECTOMY WITH LYSIS OF ADHESIONS - 30MINUTES;  Surgeon: Armandina Gemma, MD;  Location: [place] [facility];  Service: General;  Laterality: N/A;  . COLON SURGERY    . EYE SURGERY    . HEMICOLECTOMY  03/1996   with 3 lymph nodes  . HERNIA REPAIR    . INGUINAL HERNIA REPAIR Left 08/21/2013   Procedure:  LEFT INGUINAL HERNIA REPAIR WITH MESH;  Surgeon: Earnstine Regal, MD;  Location: [facility];  Service: General;  Laterality: Left;  . INGUINAL HERNIA REPAIR Right 01/29/2014   Procedure:  REPAIR RIGHT INGUNIAL HERNIA WITH MESH;  Surgeon: Earnstine Regal, MD;  Location[facility];  Service: General;  Laterality: Right;  . INSERTION OF MESH Left 08/21/2013   Procedure: INSERTION OF MESH;  Surgeon: Earnstine Regal, MD;  [facility];  Service: General;  Laterality: Left;  . TUMOR REMOVAL Right 1961   back of leg    Current Medications: Current Meds  Medication Sig  . bimatoprost (LUMIGAN) 0.01 % SOLN  Place 1 drop into both eyes at bedtime.   . cyanocobalamin (,VITAMIN B-12,) 1000 MCG/ML injection Inject 1,000 mcg into the muscle every 30 (thirty) days.   Marland Kitchen doxazosin (CARDURA) 4 MG tablet Take 1 tablet (4 mg total) by mouth daily.  . folic acid (FOLVITE) 1 MG tablet Take 1 mg by mouth daily.   . furosemide (LASIX) 40 MG tablet TAKE 1 TABLET BY MOUTH DAILY. TAKE AN EXTRA ONE DAILY AS NEEDED FOR SWELLING IN LOWER EXTREMITIES  . meloxicam (MOBIC) 15 MG tablet Take 15 mg by mouth daily as needed for pain.   . metoprolol succinate (TOPROL-XL) 25 MG 24 hr tablet Take 1 tablet (25 mg total) by mouth daily.  . traMADol (ULTRAM) 50 MG tablet Take 50 mg by mouth every 4 (four) hours as needed for moderate  pain or severe pain. Maximum dose= 8 tablets per day  . XARELTO 20 MG TABS tablet Take 20 mg by mouth daily.    Allergies:   Ace inhibitors and Clonidine derivatives   Social History   Social History  . Marital status: Widowed    Spouse name: N/A  . Number of children: N/A  . Years of education: N/A   Occupational History  . Retired     Social History Main Topics  . Smoking status: Former Smoker    Packs/day: 2.00    Years: 20.00    Types: Cigarettes    Quit date: 10/02/1974  . Smokeless tobacco: Never Used  . Alcohol use 0.0 oz/week     Comment: 1-2 beers per day  . Drug use: No  . Sexual activity: Not Asked   Other Topics Concern  . None   Social History Narrative   Pt lives in [place] with spouse.   Retired in 1999 from the [employer].  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           Family History:  The patient's family history includes Allergies in his daughter; Asthma in his daughter; Cancer in his father; Dementia in his sister; Stroke (age of onset: 35) in his mother.   ROS:   Please see the history of present illness.    ROS All other systems reviewed and are negative.  No flowsheet data found.     PHYSICAL EXAM:   VS:  BP 140/70   Pulse 80   Ht 5\' 10"  (1.778 m)   Wt 203 lb 12.8 oz (92.4 kg)   SpO2 99%   BMI 29.24 kg/m    GEN: Well nourished, well developed, in no acute distress  HEENT: normal  Neck: no JVD, carotid bruits, or masses Cardiac: irregularly irregular; no murmurs, rubs, or gallops.  Trace LLE edema.  Intact distal pulses bilaterally.  Respiratory:  clear to auscultation bilaterally, normal work of breathing GI: soft, nontender, nondistended, + BS MS: no deformity or atrophy  Skin: warm and dry, no rash Neuro:  Alert and Oriented x 3, Strength and sensation are intact Psych: euthymic mood, full affect  Wt Readings from Last 3 Encounters:  03/09/17 203 lb 12.8 oz (92.4 kg)    02/23/16 208 lb 12.8 oz (94.7 kg)  08/25/15 212 lb 3.2 oz (96.3 kg)      Studies/Labs Reviewed:    EKG:  EKG is not ordered today.   Recent Labs: No results found for requested labs within last 8760 hours.   Lipid Panel No results found for: CHOL, TRIG, HDL, CHOLHDL, VLDL, LDLCALC,  LDLDIRECT  Additional studies/ records that were reviewed today include:  CPAP download    ASSESSMENT:    1. Permanent atrial fibrillation ([place])   2. Essential hypertension   3. Chronic diastolic heart failure ([place])   4. OSA (obstructive sleep apnea)      PLAN:  In order of problems listed above:  1.  Permanent atrial fibrillation - he is rate controlled.  He will continue on Toprol and Xarelto.  I will check a NOAC panel. 2.  HTN - His BP is adequately controlled on exam today.  He will continue on doxazosin 4mg  daily  and Toprol XL 25mg  daily. 3.  Chronic diastolic CHF - he appears euvolemic on exam and weight is stable.  He will continue on Lasix 40mg  daily.  I will check a BMET today. 4.   OSA - the patient is tolerating PAP therapy well without any problems. The PAP download was reviewed today and showed an AHI of 14.5/hr on 7 cm H2O with 97% compliance in using more than 4 hours nightly.  He is going to get a new nasal pillow mask in a smaller size because his is leaking.  I will order the new mask and then get a 2 week autotitration from 4 to 18cm H2O.  The patient has been using and benefiting from CPAP use and will continue to benefit from therapy.    Medication Adjustments/Labs and Tests Ordered: Current medicines are reviewed at length with the patient today.  Concerns regarding medicines are outlined above.  Medication changes, Labs and Tests ordered today are listed in the Patient Instructions below.  There are no Patient Instructions on file for this visit.   Signed, Fransico Him, MD  03/09/2017 9:28 AM    [facility] [place], [place], [place]   [address] Phone: [phone]; Fax: [phone]

## 2017-03-09 NOTE — Patient Instructions (Signed)
Medication Instructions:  Your physician recommends that you continue on your current medications as directed. Please refer to the Current Medication list given to you today.   Labwork: TODAY: BMET  Testing/Procedures: None  Follow-Up: Your physician wants you to follow-up in: 6 months with Dr. Radford Pax. You will receive a reminder letter in the mail two months in advance. If you don't receive a letter, please call our office to schedule the follow-up appointment.   Any Other Special Instructions Will Be Listed Below (If Applicable). Dr. Radford Pax has ordered for you a new mask and an auto-titration on your CPAP. Please call Gae Bon, our CPAP assistant, directly at [phone] if you have any questions or concerns.    If you need a refill on your cardiac medications before your next appointment, please call your pharmacy.

## 2017-03-09 NOTE — Telephone Encounter (Signed)
[facility] notified of new order

## 2017-03-09 NOTE — Telephone Encounter (Signed)
-----   Message from Sueanne Margarita, MD sent at 03/08/2017  7:06 PM EDT ----- AHI too high - please get a 2 week autotitration from 4 to 18cm H2O

## 2017-04-05 ENCOUNTER — Encounter: Payer: Self-pay | Admitting: Cardiology

## 2017-04-05 DIAGNOSIS — L97529 Non-pressure chronic ulcer of other part of left foot with unspecified severity: Secondary | ICD-10-CM | POA: Diagnosis not present

## 2017-04-05 DIAGNOSIS — E1151 Type 2 diabetes mellitus with diabetic peripheral angiopathy without gangrene: Secondary | ICD-10-CM | POA: Diagnosis not present

## 2017-04-05 DIAGNOSIS — I739 Peripheral vascular disease, unspecified: Secondary | ICD-10-CM | POA: Diagnosis not present

## 2017-04-05 DIAGNOSIS — L84 Corns and callosities: Secondary | ICD-10-CM | POA: Diagnosis not present

## 2017-04-05 DIAGNOSIS — L603 Nail dystrophy: Secondary | ICD-10-CM | POA: Diagnosis not present

## 2017-04-16 DIAGNOSIS — E1165 Type 2 diabetes mellitus with hyperglycemia: Secondary | ICD-10-CM | POA: Diagnosis not present

## 2017-04-16 DIAGNOSIS — E538 Deficiency of other specified B group vitamins: Secondary | ICD-10-CM | POA: Diagnosis not present

## 2017-04-20 DIAGNOSIS — L97522 Non-pressure chronic ulcer of other part of left foot with fat layer exposed: Secondary | ICD-10-CM | POA: Diagnosis not present

## 2017-05-04 DIAGNOSIS — L97522 Non-pressure chronic ulcer of other part of left foot with fat layer exposed: Secondary | ICD-10-CM | POA: Diagnosis not present

## 2017-05-09 DIAGNOSIS — H26493 Other secondary cataract, bilateral: Secondary | ICD-10-CM | POA: Diagnosis not present

## 2017-05-09 DIAGNOSIS — H401112 Primary open-angle glaucoma, right eye, moderate stage: Secondary | ICD-10-CM | POA: Diagnosis not present

## 2017-05-09 DIAGNOSIS — H401122 Primary open-angle glaucoma, left eye, moderate stage: Secondary | ICD-10-CM | POA: Diagnosis not present

## 2017-05-09 DIAGNOSIS — H1859 Other hereditary corneal dystrophies: Secondary | ICD-10-CM | POA: Diagnosis not present

## 2017-05-14 DIAGNOSIS — E538 Deficiency of other specified B group vitamins: Secondary | ICD-10-CM | POA: Diagnosis not present

## 2017-05-16 NOTE — Telephone Encounter (Signed)
Reached out to [facility]) about patient's 2 week auto titration because I don't remember receiving the results. Cameron [name] states results were sent on 7/9.  She re-faxed the results today and have been sent to be scanned.

## 2017-05-23 DIAGNOSIS — M792 Neuralgia and neuritis, unspecified: Secondary | ICD-10-CM | POA: Diagnosis not present

## 2017-05-23 DIAGNOSIS — G609 Hereditary and idiopathic neuropathy, unspecified: Secondary | ICD-10-CM | POA: Diagnosis not present

## 2017-05-24 DIAGNOSIS — H26491 Other secondary cataract, right eye: Secondary | ICD-10-CM | POA: Diagnosis not present

## 2017-05-30 ENCOUNTER — Telehealth: Payer: Self-pay | Admitting: *Deleted

## 2017-05-30 NOTE — Telephone Encounter (Signed)
-----   Message from Sueanne Margarita, MD sent at 05/17/2017  8:52 PM EDT ----- Very large mask leak - please find out what mask patient is currently using

## 2017-05-30 NOTE — Telephone Encounter (Signed)
Patient states his mask is triangle shaped and fits over his nose.  [facility] states patient is on nasal mask. Patients states he can hear when his mask leaks and he has not not heard any air escaping.

## 2017-06-13 DIAGNOSIS — E538 Deficiency of other specified B group vitamins: Secondary | ICD-10-CM | POA: Diagnosis not present

## 2017-06-13 DIAGNOSIS — Z23 Encounter for immunization: Secondary | ICD-10-CM | POA: Diagnosis not present

## 2017-06-14 DIAGNOSIS — M71571 Other bursitis, not elsewhere classified, right ankle and foot: Secondary | ICD-10-CM | POA: Diagnosis not present

## 2017-06-14 DIAGNOSIS — M2041 Other hammer toe(s) (acquired), right foot: Secondary | ICD-10-CM | POA: Diagnosis not present

## 2017-07-11 DIAGNOSIS — E538 Deficiency of other specified B group vitamins: Secondary | ICD-10-CM | POA: Diagnosis not present

## 2017-07-11 DIAGNOSIS — Z23 Encounter for immunization: Secondary | ICD-10-CM | POA: Diagnosis not present

## 2017-07-11 DIAGNOSIS — D510 Vitamin B12 deficiency anemia due to intrinsic factor deficiency: Secondary | ICD-10-CM | POA: Diagnosis not present

## 2017-07-19 DIAGNOSIS — L84 Corns and callosities: Secondary | ICD-10-CM | POA: Diagnosis not present

## 2017-07-19 DIAGNOSIS — I739 Peripheral vascular disease, unspecified: Secondary | ICD-10-CM | POA: Diagnosis not present

## 2017-07-19 DIAGNOSIS — L603 Nail dystrophy: Secondary | ICD-10-CM | POA: Diagnosis not present

## 2017-07-19 DIAGNOSIS — E1151 Type 2 diabetes mellitus with diabetic peripheral angiopathy without gangrene: Secondary | ICD-10-CM | POA: Diagnosis not present

## 2017-08-08 DIAGNOSIS — E538 Deficiency of other specified B group vitamins: Secondary | ICD-10-CM | POA: Diagnosis not present

## 2017-09-05 DIAGNOSIS — E538 Deficiency of other specified B group vitamins: Secondary | ICD-10-CM | POA: Diagnosis not present

## 2017-09-27 ENCOUNTER — Encounter: Payer: Self-pay | Admitting: *Deleted

## 2017-09-27 ENCOUNTER — Other Ambulatory Visit: Payer: Self-pay | Admitting: Cardiology

## 2017-10-12 DIAGNOSIS — E1151 Type 2 diabetes mellitus with diabetic peripheral angiopathy without gangrene: Secondary | ICD-10-CM | POA: Diagnosis not present

## 2017-10-12 DIAGNOSIS — L603 Nail dystrophy: Secondary | ICD-10-CM | POA: Diagnosis not present

## 2017-10-12 DIAGNOSIS — L97522 Non-pressure chronic ulcer of other part of left foot with fat layer exposed: Secondary | ICD-10-CM | POA: Diagnosis not present

## 2017-10-12 DIAGNOSIS — L84 Corns and callosities: Secondary | ICD-10-CM | POA: Diagnosis not present

## 2017-10-12 DIAGNOSIS — M2042 Other hammer toe(s) (acquired), left foot: Secondary | ICD-10-CM | POA: Diagnosis not present

## 2017-10-12 DIAGNOSIS — I739 Peripheral vascular disease, unspecified: Secondary | ICD-10-CM | POA: Diagnosis not present

## 2017-10-17 NOTE — Progress Notes (Signed)
Cardiology Office Note:    Date:  10/18/2017   ID:  Cameron [name], DOB 12/07/1933, MRN [id number]  PCP:  Gaynelle Arabian, MD  Cardiologist:  No primary care provider on file.    Referring MD: Gaynelle Arabian, MD   Chief Complaint  Patient presents with  . Atrial Fibrillation  . Congestive Heart Failure    History of Present Illness:    Cameron [name] is a 84 y.o. male with a hx of  permanent atrial fibrillation, chronic diastolic CHF, asymptomatic bradycardia, systemic anticoagulation and OSA on CPAP.  He has moderate OSA with an AHI of 17.53/hr and is on CPAP at 7cm H2O.    He is here today for followup and is doing well.  He denies any chest pain or pressure, SOB, DOE, PND, orthopnea,dizziness, palpitations or syncope. He has chronic LE edema controlled on diuretics.  He is compliant with his meds and is tolerating meds with no SE.  He is doing well with his CPAP device.  He tolerates the nasal mask and feels the pressure is adequate.  Since going on CPAP he continues to  feel rested in the am and has no significant daytime sleepiness.  He denies any significant mouth or nasal dryness or nasal congestion.  He does not think that he snores.      Past Medical History:  Diagnosis Date  . Acute cholecystitis 09/02/2014  . Anemia    hx of  . Arthritis   . Chronic anticoagulation   . Chronic diastolic (congestive) heart failure ([place])   . Chronic kidney disease 2016   January UTI  . Diabetes mellitus without complication (HCC)    borderline  . GERD (gastroesophageal reflux disease)   . Gout    in 70's, "not in toe but everywhere else"  . Hypertension   . IFG (impaired fasting glucose) 09/05/2014  . Low back pain    when lying flat  . Neuromuscular disorder ([place])   . OSA (obstructive sleep apnea)    moderate with AHI 17.53/hr now on CPAP at 7cm H2O  . Peripheral neuropathy    both feet, can feel pain  . Permanent atrial fibrillation ([place])   . Rosacea   . Swelling    both  legs sometimes  . Vitamin B12 deficiency     Past Surgical History:  Procedure Laterality Date  . APPENDECTOMY     1997  . CARDIOVERSION     at least 5 times  . CARDIOVERSION N/A 10/24/2013   Procedure: CARDIOVERSION;  Surgeon: Sueanne Margarita, MD;  Location: [facility];  Service: Cardiovascular;  Laterality: N/A;  . CATARACT EXTRACTION Bilateral   . CHOLECYSTECTOMY N/A 11/16/2014   Procedure: ATTEMPTED LAPAROSCOPIC CHOLECYSTECTOMY TO OPEN CHOLECYSTECTOMY WITH LYSIS OF ADHESIONS - 30MINUTES;  Surgeon: Armandina Gemma, MD;  Location: [facility];  Service: General;  Laterality: N/A;  . COLON SURGERY    . EYE SURGERY    . HEMICOLECTOMY  03/1996   with 3 lymph nodes  . HERNIA REPAIR    . INGUINAL HERNIA REPAIR Left 08/21/2013   Procedure:  LEFT INGUINAL HERNIA REPAIR WITH MESH;  Surgeon: Earnstine Regal, MD;  [facility];  Service: General;  Laterality: Left;  . INGUINAL HERNIA REPAIR Right 01/29/2014   Procedure:  REPAIR RIGHT INGUNIAL HERNIA WITH MESH;  Surgeon: Earnstine Regal, MD;  [facility];  Service: General;  Laterality: Right;  . INSERTION OF MESH Left 08/21/2013   Procedure: INSERTION OF MESH;  Surgeon:  Earnstine Regal, MD;  [facility];  Service: General;  Laterality: Left;  . TUMOR REMOVAL Right 1961   back of leg    Current Medications: Current Meds  Medication Sig  . bimatoprost (LUMIGAN) 0.01 % SOLN Place 1 drop into both eyes at bedtime.   . cyanocobalamin (,VITAMIN B-12,) 1000 MCG/ML injection Inject 1,000 mcg into the muscle every 30 (thirty) days.   Marland Kitchen doxazosin (CARDURA) 4 MG tablet TAKE 1 TABLET(4 MG) BY MOUTH DAILY  . folic acid (FOLVITE) 1 MG tablet Take 1 mg by mouth daily.   . furosemide (LASIX) 40 MG tablet TAKE 1 TABLET BY MOUTH DAILY. TAKE AN EXTRA TABLET BY MOUTH DAILY AS NEEDED FOR SWELLING IN LOWER EXTREMITIES  . meloxicam (MOBIC) 15 MG tablet Take 15 mg by mouth daily as needed for pain.   . metoprolol succinate (TOPROL-XL) 25 MG 24 hr tablet TAKE 1  TABLET(25 MG) BY MOUTH DAILY  . traMADol (ULTRAM) 50 MG tablet Take 50 mg by mouth every 4 (four) hours as needed for moderate pain or severe pain. Maximum dose= 8 tablets per day  . XARELTO 20 MG TABS tablet Take 1 tablet (20 mg total) by mouth daily.     Allergies:   Ace inhibitors and Clonidine derivatives   Social History   Socioeconomic History  . Marital status: Widowed    Spouse name: None  . Number of children: None  . Years of education: None  . Highest education level: None  Social Needs  . Financial resource strain: None  . Food insecurity - worry: None  . Food insecurity - inability: None  . Transportation needs - medical: None  . Transportation needs - non-medical: None  Occupational History  . Occupation: Retired   Tobacco Use  . Smoking status: Former Smoker    Packs/day: 2.00    Years: 20.00    Pack years: 40.00    Types: Cigarettes    Last attempt to quit: 10/02/1974    Years since quitting: 43.0  . Smokeless tobacco: Never Used  Substance and Sexual Activity  . Alcohol use: Yes    Alcohol/week: 0.0 oz    Comment: 1-2 beers per day  . Drug use: No  . Sexual activity: None  Other Topics Concern  . None  Social History Narrative   Pt lives in [place] with spouse.   Retired in 1999 from the [employer].  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           Family History: The patient's family history includes Allergies in his daughter; Asthma in his daughter; Cancer in his father; Dementia in his sister; Stroke (age of onset: 3) in his mother.  ROS:   Please see the history of present illness.    Review of Systems  Musculoskeletal: Positive for joint swelling and muscle cramps.    All other systems reviewed and negative.   EKGs/Labs/Other Studies Reviewed:    The following studies were reviewed today: CPAP download  EKG:  EKG is not ordered today.    Recent Labs: 03/09/2017: BUN 20;  Creatinine, Ser 1.12; Hemoglobin 12.9; Platelets 139; Potassium 3.6; Sodium 142   Recent Lipid Panel No results found for: CHOL, TRIG, HDL, CHOLHDL, VLDL, LDLCALC, LDLDIRECT  Physical Exam:    VS:  BP (!) 152/80   Pulse 66   Ht 5\' 10"  (1.778 m)   Wt 202 lb 6.4 oz (91.8 kg)   SpO2 97%  BMI 29.04 kg/m     Wt Readings from Last 3 Encounters:  10/18/17 202 lb 6.4 oz (91.8 kg)  03/09/17 203 lb 12.8 oz (92.4 kg)  02/23/16 208 lb 12.8 oz (94.7 kg)     GEN:  Well nourished, well developed in no acute distress HEENT: Normal NECK: No JVD; No carotid bruits LYMPHATICS: No lymphadenopathy CARDIAC: Irregularly irregular, no murmurs, rubs, gallops RESPIRATORY:  Clear to auscultation without rales, wheezing or rhonchi  ABDOMEN: Soft, non-tender, non-distended MUSCULOSKELETAL:  No edema; No deformity  SKIN: Warm and dry NEUROLOGIC:  Alert and oriented x 3 PSYCHIATRIC:  Normal affect   ASSESSMENT:    1. Permanent atrial fibrillation ([place])   2. Essential hypertension   3. Chronic diastolic heart failure ([place])   4. OSA (obstructive sleep apnea)    PLAN:    In order of problems listed above:  1.  Permanent atrial fibrillation - He is well rate controlled on exam today.  He will continue on Toprol XL 25mg  daily for rate control and Xarelto 20mg  daily.  I will check a BMET and CBC as he is on a NOAC.  2.  HTN - BP is borderline controlled on exam today.  He has not taken his meds today yet.  He takes both BP meds at night.  He will continue on Doxazosin 4mg  qpm and start taking Toprol XL 25mg  daily in the am. I have asked him to check his BP daily for a week and call with the results.   3.  Chronic diastolic CHF - he appears euvolemic on exam today.  His weight is stable.  He will continue on Lasix 40mg  daily.  4.  OSA - the patient is tolerating PAP therapy well without any problems. The PAP download was reviewed today and showed an AHI of 4.6/hr on 7 cm H2O with 97% compliance in using  more than 4 hours nightly.  The patient has been using and benefiting from PAP use and will continue to benefit from therapy.    Medication Adjustments/Labs and Tests Ordered: Current medicines are reviewed at length with the patient today.  Concerns regarding medicines are outlined above.  No orders of the defined types were placed in this encounter.  No orders of the defined types were placed in this encounter.   Signed, Fransico Him, MD  10/18/2017 8:12 AM    [place]

## 2017-10-18 ENCOUNTER — Ambulatory Visit (INDEPENDENT_AMBULATORY_CARE_PROVIDER_SITE_OTHER): Payer: Medicare Other | Admitting: Cardiology

## 2017-10-18 ENCOUNTER — Encounter: Payer: Self-pay | Admitting: Cardiology

## 2017-10-18 VITALS — BP 152/80 | HR 66 | Ht 70.0 in | Wt 202.4 lb

## 2017-10-18 DIAGNOSIS — I4821 Permanent atrial fibrillation: Secondary | ICD-10-CM

## 2017-10-18 DIAGNOSIS — I5032 Chronic diastolic (congestive) heart failure: Secondary | ICD-10-CM

## 2017-10-18 DIAGNOSIS — I482 Chronic atrial fibrillation: Secondary | ICD-10-CM

## 2017-10-18 DIAGNOSIS — G4733 Obstructive sleep apnea (adult) (pediatric): Secondary | ICD-10-CM | POA: Diagnosis not present

## 2017-10-18 DIAGNOSIS — I10 Essential (primary) hypertension: Secondary | ICD-10-CM

## 2017-10-18 NOTE — Patient Instructions (Signed)
Medication Instructions:  Your physician recommends that you continue on your current medications as directed. Please refer to the Current Medication list given to you today.  If you need a refill on your cardiac medications, please contact your pharmacy first.  Labwork: None ordered   Testing/Procedures: None ordered   Follow-Up: Your physician wants you to follow-up in: 6 months with Melina Copa, PA. You will receive a reminder letter in the mail two months in advance. If you don't receive a letter, please call our office to schedule the follow-up appointment.  Your physician wants you to follow-up in: 1 year with Dr. Radford Pax. You will receive a reminder letter in the mail two months in advance. If you don't receive a letter, please call our office to schedule the follow-up appointment.  Any Other Special Instructions Will Be Listed Below (If Applicable).     If you need a refill on your cardiac medications before your next appointment, please call your pharmacy.

## 2017-10-22 DIAGNOSIS — Z1389 Encounter for screening for other disorder: Secondary | ICD-10-CM | POA: Diagnosis not present

## 2017-10-22 DIAGNOSIS — G629 Polyneuropathy, unspecified: Secondary | ICD-10-CM | POA: Diagnosis not present

## 2017-10-22 DIAGNOSIS — E119 Type 2 diabetes mellitus without complications: Secondary | ICD-10-CM | POA: Diagnosis not present

## 2017-10-22 DIAGNOSIS — E781 Pure hyperglyceridemia: Secondary | ICD-10-CM | POA: Diagnosis not present

## 2017-10-22 DIAGNOSIS — Z0000 Encounter for general adult medical examination without abnormal findings: Secondary | ICD-10-CM | POA: Diagnosis not present

## 2017-10-22 DIAGNOSIS — I4891 Unspecified atrial fibrillation: Secondary | ICD-10-CM | POA: Diagnosis not present

## 2017-10-22 DIAGNOSIS — I10 Essential (primary) hypertension: Secondary | ICD-10-CM | POA: Diagnosis not present

## 2017-10-22 DIAGNOSIS — E538 Deficiency of other specified B group vitamins: Secondary | ICD-10-CM | POA: Diagnosis not present

## 2017-10-22 DIAGNOSIS — D510 Vitamin B12 deficiency anemia due to intrinsic factor deficiency: Secondary | ICD-10-CM | POA: Diagnosis not present

## 2017-10-22 DIAGNOSIS — H409 Unspecified glaucoma: Secondary | ICD-10-CM | POA: Diagnosis not present

## 2017-10-26 DIAGNOSIS — L03032 Cellulitis of left toe: Secondary | ICD-10-CM | POA: Diagnosis not present

## 2017-11-06 ENCOUNTER — Telehealth: Payer: Self-pay | Admitting: Cardiology

## 2017-11-06 DIAGNOSIS — E876 Hypokalemia: Secondary | ICD-10-CM | POA: Diagnosis not present

## 2017-11-06 NOTE — Telephone Encounter (Signed)
Walk In pt Form-BP readings dropped off.placed in Dr.Turner doc box

## 2017-11-09 DIAGNOSIS — L97522 Non-pressure chronic ulcer of other part of left foot with fat layer exposed: Secondary | ICD-10-CM | POA: Diagnosis not present

## 2017-11-22 DIAGNOSIS — E538 Deficiency of other specified B group vitamins: Secondary | ICD-10-CM | POA: Diagnosis not present

## 2017-11-30 DIAGNOSIS — M2042 Other hammer toe(s) (acquired), left foot: Secondary | ICD-10-CM | POA: Diagnosis not present

## 2017-12-01 ENCOUNTER — Emergency Department (HOSPITAL_COMMUNITY): Payer: Medicare Other

## 2017-12-01 ENCOUNTER — Encounter (HOSPITAL_COMMUNITY): Payer: Self-pay | Admitting: Emergency Medicine

## 2017-12-01 ENCOUNTER — Emergency Department (HOSPITAL_COMMUNITY)
Admission: EM | Admit: 2017-12-01 | Discharge: 2017-12-02 | Disposition: A | Discharge status: Home / Self Care | Payer: Medicare Other | Attending: Emergency Medicine | Admitting: Emergency Medicine

## 2017-12-01 DIAGNOSIS — Z7901 Long term (current) use of anticoagulants: Secondary | ICD-10-CM | POA: Diagnosis not present

## 2017-12-01 DIAGNOSIS — R200 Anesthesia of skin: Secondary | ICD-10-CM | POA: Diagnosis not present

## 2017-12-01 DIAGNOSIS — N189 Chronic kidney disease, unspecified: Secondary | ICD-10-CM | POA: Insufficient documentation

## 2017-12-01 DIAGNOSIS — Z79899 Other long term (current) drug therapy: Secondary | ICD-10-CM | POA: Insufficient documentation

## 2017-12-01 DIAGNOSIS — Z87891 Personal history of nicotine dependence: Secondary | ICD-10-CM | POA: Insufficient documentation

## 2017-12-01 DIAGNOSIS — I4891 Unspecified atrial fibrillation: Secondary | ICD-10-CM | POA: Diagnosis not present

## 2017-12-01 DIAGNOSIS — I130 Hypertensive heart and chronic kidney disease with heart failure and stage 1 through stage 4 chronic kidney disease, or unspecified chronic kidney disease: Secondary | ICD-10-CM | POA: Diagnosis not present

## 2017-12-01 DIAGNOSIS — I5032 Chronic diastolic (congestive) heart failure: Secondary | ICD-10-CM | POA: Insufficient documentation

## 2017-12-01 DIAGNOSIS — R202 Paresthesia of skin: Secondary | ICD-10-CM | POA: Diagnosis not present

## 2017-12-01 DIAGNOSIS — R000 Tachycardia, unspecified: Secondary | ICD-10-CM | POA: Diagnosis not present

## 2017-12-01 LAB — I-STAT TROPONIN, ED: Troponin i, poc: 0.01 ng/mL (ref 0.00–0.08)

## 2017-12-01 LAB — BASIC METABOLIC PANEL
ANION GAP: 13 (ref 5–15)
BUN: 21 mg/dL — ABNORMAL HIGH (ref 6–20)
CO2: 21 mmol/L — AB (ref 22–32)
Calcium: 8.8 mg/dL — ABNORMAL LOW (ref 8.9–10.3)
Chloride: 102 mmol/L (ref 101–111)
Creatinine, Ser: 1.16 mg/dL (ref 0.61–1.24)
GFR calc Af Amer: >60 mL/min (ref >60)
GFR calc non Af Amer: 57 mL/min — ABNORMAL LOW (ref >60)
GLUCOSE: 141 mg/dL — AB (ref 65–99)
POTASSIUM: 3.7 mmol/L (ref 3.5–5.1)
Sodium: 136 mmol/L (ref 135–145)

## 2017-12-01 LAB — CBC
HEMATOCRIT: 41.5 % (ref 39.0–52.0)
Hemoglobin: 13.7 g/dL (ref 13.0–17.0)
MCH: 28.2 pg (ref 26.0–34.0)
MCHC: 33 g/dL (ref 30.0–36.0)
MCV: 85.6 fL (ref 78.0–100.0)
Platelets: 130 10*3/uL — ABNORMAL LOW (ref 150–400)
RBC: 4.85 MIL/uL (ref 4.22–5.81)
RDW: 15.3 % (ref 11.5–15.5)
WBC: 8.1 10*3/uL (ref 4.0–10.5)

## 2017-12-01 NOTE — ED Triage Notes (Signed)
Reports was finishing watching a basketball game when he had sudden onset of numbness in left hand up the left arm into left side of the face.  Reports hx of afib and thought that might be related.  Denied having any other symptoms.

## 2017-12-02 ENCOUNTER — Emergency Department (HOSPITAL_COMMUNITY): Payer: Medicare Other

## 2017-12-02 DIAGNOSIS — R200 Anesthesia of skin: Secondary | ICD-10-CM | POA: Diagnosis not present

## 2017-12-02 DIAGNOSIS — I4891 Unspecified atrial fibrillation: Secondary | ICD-10-CM | POA: Diagnosis not present

## 2017-12-02 MED ORDER — METOPROLOL TARTRATE 5 MG/5ML IV SOLN: 5.0000 mg | Freq: Once | INTRAVENOUS | Status: DC

## 2017-12-02 NOTE — ED Notes (Signed)
Pt understood dc material. NAD Noted 

## 2017-12-03 NOTE — ED Provider Notes (Signed)
[facility] EMERGENCY DEPARTMENT Provider Note   CSN: [id number] Arrival date & time: 12/01/17  2253     History   Chief Complaint Chief Complaint  Patient presents with  . Atrial Fibrillation    HPI Cameron [name] is a 84 y.o. male.  HPI Patient is an 84 year old male with a history of chronic atrial fibrillation who presents the emergency department with an acute electric-like shooting pain noted in his left arm from approximately his left forearm up his left arm and into his left neck and face.  He states the symptoms lasted a few seconds and then resolved.  He is never had symptoms like this before.  He called his son.  His son reports no dysarthria or aphasia noted.  No confusion.  Patient currently is asymptomatic.  No headache.  Denies chest pain.  No shortness of breath.  Denies weakness of his arms or legs.  No paresthesias at this time.  Patient is on chronic anticoagulation given his atrial fibrillation   Past Medical History:  Diagnosis Date  . Acute cholecystitis 09/02/2014  . Anemia    hx of  . Arthritis   . Chronic anticoagulation   . Chronic diastolic (congestive) heart failure ([place])   . Chronic kidney disease 2016   January UTI  . Diabetes mellitus without complication (HCC)    borderline  . GERD (gastroesophageal reflux disease)   . Gout    in 70's, "not in toe but everywhere else"  . Hypertension   . IFG (impaired fasting glucose) 09/05/2014  . Low back pain    when lying flat  . Neuromuscular disorder ([place])   . OSA (obstructive sleep apnea)    moderate with AHI 17.53/hr now on CPAP at 7cm H2O  . Peripheral neuropathy    both feet, can feel pain  . Permanent atrial fibrillation ([place])   . Rosacea   . Swelling    both legs sometimes  . Vitamin B12 deficiency     Patient Active Problem List   Diagnosis Date Noted  . Cholelithiasis with acute cholecystitis 11/16/2014  . Chronic cough 10/01/2014  . Dyspnea 09/28/2014  . IFG  (impaired fasting glucose) 09/05/2014  . Hypokalemia 09/05/2014  . Abdominal pain   . Elevated lipase 09/03/2014  . Elevated brain natriuretic peptide (BNP) level 09/03/2014  . Elevated bilirubin 09/03/2014  . Hyperglycemia 09/03/2014  . Acute confusional state 09/03/2014  . Cholelithiasis 09/03/2014  . Choledocholithiasis 09/03/2014  . OSA (obstructive sleep apnea) 02/16/2014  . Chronic diastolic heart failure ([place]) 11/15/2013  . Edema extremities 08/27/2013  . Hypertension   . Chronic anticoagulation   . Right inguinal hernia, direct 08/13/2013  . Permanent atrial fibrillation ([place]) 08/23/2011    Past Surgical History:  Procedure Laterality Date  . APPENDECTOMY     1997  . CARDIOVERSION     at least 5 times  . CARDIOVERSION N/A 10/24/2013   Procedure: CARDIOVERSION;  Surgeon: Sueanne Margarita, MD;  Location: [facility];  Service: Cardiovascular;  Laterality: N/A;  . CATARACT EXTRACTION Bilateral   . CHOLECYSTECTOMY N/A 11/16/2014   Procedure: ATTEMPTED LAPAROSCOPIC CHOLECYSTECTOMY TO OPEN CHOLECYSTECTOMY WITH LYSIS OF ADHESIONS - 30MINUTES;  Surgeon: Armandina Gemma, MD;  Location: [facility];  Service: General;  Laterality: N/A;  . COLON SURGERY    . EYE SURGERY    . HEMICOLECTOMY  03/1996   with 3 lymph nodes  . HERNIA REPAIR    . INGUINAL HERNIA REPAIR Left 08/21/2013   Procedure:  LEFT INGUINAL HERNIA REPAIR WITH MESH;  Surgeon: Earnstine Regal, MD;  Location: [facility];  Service: General;  Laterality: Left;  . INGUINAL HERNIA REPAIR Right 01/29/2014   Procedure:  REPAIR RIGHT INGUNIAL HERNIA WITH MESH;  Surgeon: Earnstine Regal, MD;  [facility];  Service: General;  Laterality: Right;  . INSERTION OF MESH Left 08/21/2013   Procedure: INSERTION OF MESH;  Surgeon: Earnstine Regal, MD;  [facility];  Service: General;  Laterality: Left;  . TUMOR REMOVAL Right 1961   back of leg       Home Medications    Prior to Admission medications   Medication Sig Start Date End Date  Taking? Authorizing Provider  bimatoprost (LUMIGAN) 0.01 % SOLN Place 1 drop into both eyes at bedtime.     [provider]  cyanocobalamin (,VITAMIN B-12,) 1000 MCG/ML injection Inject 1,000 mcg into the muscle every 30 (thirty) days.     [provider]  doxazosin (CARDURA) 4 MG tablet TAKE 1 TABLET(4 MG) BY MOUTH DAILY 09/27/17   Sueanne Margarita, MD  folic acid (FOLVITE) 1 MG tablet Take 1 mg by mouth daily.     [provider]  furosemide (LASIX) 40 MG tablet TAKE 1 TABLET BY MOUTH DAILY. TAKE AN EXTRA TABLET BY MOUTH DAILY AS NEEDED FOR SWELLING IN LOWER EXTREMITIES 03/09/17   Sueanne Margarita, MD  meloxicam (MOBIC) 15 MG tablet Take 15 mg by mouth daily as needed for pain.     [provider]  metoprolol succinate (TOPROL-XL) 25 MG 24 hr tablet TAKE 1 TABLET(25 MG) BY MOUTH DAILY 09/27/17   Turner, Eber Hong, MD  traMADol (ULTRAM) 50 MG tablet Take 50 mg by mouth every 4 (four) hours as needed for moderate pain or severe pain. Maximum dose= 8 tablets per day    [provider]  XARELTO 20 MG TABS tablet Take 1 tablet (20 mg total) by mouth daily. 03/09/17   Sueanne Margarita, MD    Family History Family History  Problem Relation Age of Onset  . Stroke Mother 62  . Cancer Father        lung  . Dementia Sister   . Asthma Daughter   . Allergies Daughter     Social History Social History   Tobacco Use  . Smoking status: Former Smoker    Packs/day: 2.00    Years: 20.00    Pack years: 40.00    Types: Cigarettes    Last attempt to quit: 10/02/1974    Years since quitting: 43.2  . Smokeless tobacco: Never Used  Substance Use Topics  . Alcohol use: Yes    Alcohol/week: 0.0 oz    Comment: occasionally  . Drug use: No     Allergies   Ace inhibitors and Clonidine derivatives   Review of Systems Review of Systems  All other systems reviewed and are negative.    Physical Exam Updated Vital Signs BP 128/67   Pulse (!) 122   Temp 98.1  F (36.7 C) (Oral)   Resp 18   Ht 5\' 8"  (1.727 m)   Wt 91.6 kg (202 lb)   SpO2 100%   BMI 30.71 kg/m   Physical Exam  Constitutional: He is oriented to person, place, and time. He appears well-developed and well-nourished.  HENT:  Head: Normocephalic and atraumatic.  Eyes: EOM are normal. Pupils are equal, round, and reactive to light.  Neck: Normal range of motion.  Cardiovascular: Normal rate,  normal heart sounds and intact distal pulses.  Irregularly irregular  Pulmonary/Chest: Effort normal and breath sounds normal. No respiratory distress.  Abdominal: Soft. He exhibits no distension. There is no tenderness.  Musculoskeletal: Normal range of motion.  Neurological: He is alert and oriented to person, place, and time.  5/5 strength in major muscle groups of  bilateral upper and lower extremities. Speech normal. No facial asymetry.   Skin: Skin is warm and dry.  Psychiatric: He has a normal mood and affect. Judgment normal.  Nursing note and vitals reviewed.    ED Treatments / Results  Labs (all labs ordered are listed, but only abnormal results are displayed) Labs Reviewed  BASIC METABOLIC PANEL - Abnormal; Notable for the following components:      Result Value   CO2 21 (*)    Glucose, Bld 141 (*)    BUN 21 (*)    Calcium 8.8 (*)    GFR calc non Af Amer 57 (*)    All other components within normal limits  CBC - Abnormal; Notable for the following components:   Platelets 130 (*)    All other components within normal limits  I-STAT TROPONIN, ED    EKG  EKG Interpretation  Date/Time:  Saturday December 01 2017 23:02:54 EST Ventricular Rate:  123 PR Interval:    QRS Duration: 84 QT Interval:  324 QTC Calculation: 463 R Axis:   56 Text Interpretation:  Sinus tachycardia with Premature atrial complexes with Abberant conduction Possible Anterior infarct , age undetermined Abnormal ECG No significant change was found Confirmed by Jola Schmidt [phone]) on 12/01/2017  11:56:14 PM Also confirmed by Jola Schmidt [phone]), editor Laurena Spies [phone])  on 12/02/2017 9:26:23 AM       Radiology Dg Chest 2 View  Result Date: 12/01/2017 CLINICAL DATA:  84 y/o M; left hand, arm, and facial numbness. Tachycardia. EXAM: CHEST  2 VIEW COMPARISON:  11/09/2014 chest radiograph FINDINGS: Stable cardiomegaly given projection and technique. Aortic atherosclerosis with calcification. Clear lungs. No pleural effusion or pneumothorax. No acute osseous abnormality is evident. Upper abdominal surgical clips, presumably cholecystectomy. IMPRESSION: Stable cardiomegaly and aortic atherosclerosis. No acute pulmonary process identified. Electronically Signed   By: Kristine Garbe M.D.   On: 12/01/2017 23:51   Ct Head Wo Contrast  Result Date: 12/02/2017 CLINICAL DATA:  Acute onset numbness in the left arm hand and left side of the face this evening. EXAM: CT HEAD WITHOUT CONTRAST TECHNIQUE: Contiguous axial images were obtained from the base of the skull through the vertex without intravenous contrast. COMPARISON:  Head CT scan 09/02/2014. FINDINGS: Brain: No evidence of acute infarction, hemorrhage, hydrocephalus, extra-axial collection or mass lesion/mass effect. Vascular: No hyperdense vessel or unexpected calcification. Skull: Intact. Sinuses/Orbits: Status post bilateral lens extraction. Otherwise unremarkable. Other: None. IMPRESSION: Negative head CT. Electronically Signed   By: Inge Rise M.D.   On: 12/02/2017 00:51    Procedures Procedures (including critical care time)  Medications Ordered in ED Medications - No data to display   Initial Impression / Assessment and Plan / ED Course  I have reviewed the triage vital signs and the nursing notes.  Pertinent labs & imaging results that were available during my care of the patient were reviewed by me and considered in my medical decision making (see chart for details).     Overall the patient is  well-appearing.  He has normal neurologic exam.  Low suspicion for TIA and stroke.  His symptoms were brief and transient.  It sounds more like peripheral nerve irritation.  Discharged home in good condition.  Neurology follow-up if his symptoms return.  He understands return to the ER for new or worsening symptoms  Final Clinical Impressions(s) / ED Diagnoses   Final diagnoses:  Atrial fibrillation with [facility])  Paresthesia    ED Discharge Orders    None       Jola Schmidt, MD 12/03/17 [phone]

## 2017-12-04 ENCOUNTER — Telehealth: Payer: Self-pay | Admitting: Cardiology

## 2017-12-04 NOTE — Telephone Encounter (Signed)
Spoke with patient who states he vomited several times last night and thinks he may have vomited out some of his medications. He states currently HR is 133 bpm and BP is 148/89 mmHg. He states he has not vomited since 0400 and slept until 1400. He wants to know if he should take Toprol 25 mg now. I advised him to go ahead and take Toprol 25 mg at this time and if his HR remains > 110 bpm in several hours to take another 25 mg later tonight. I advised that he may take his Doxazosin and Xarelto and any other medications tonight as scheduled since it seems that vomiting has resolved. He states he is holding down water. He states he may have contracted a viral illness while in the ED on 3/2 and would like Dr. Radford Pax to know that he was there for evaluation of left arm tingling and concern for stroke. He states all of his tests indicated no stroke and no cardiac problems.. I advised that I will forward message to Dr. Radford Pax for her awareness and advised him to call back if HR remains elevated tomorrow or with other concerns. He verbalized understanding and agreement with plan and thanked me for the call.

## 2017-12-04 NOTE — Telephone Encounter (Signed)
Mr. Boughner is calling because he is in AFIB and took his medication , but couldn't hold them down , wanting to know if he should take his medication now and then spread it back out to his normal regiment.  His heart rate is running between 123-133 and is not coming back down . Not sure if he caught bug after going to the ER . Please call

## 2017-12-05 NOTE — Telephone Encounter (Signed)
Left message to call back  

## 2017-12-05 NOTE — Telephone Encounter (Signed)
Follow up ° ° ° °Patient returning call from nurse °

## 2017-12-05 NOTE — Telephone Encounter (Signed)
Please call and see how patient is doing today.

## 2017-12-05 NOTE — Telephone Encounter (Signed)
Spoke with patient. He states, "I feel great". He stated he did not take the additional dose of Toprol 25 mg.  His blood pressure readings this morning were:   108/59 HR 96 109/75 HR 92 113/68 HR 87 110/67 HR 92  Patient stated he would call back if he has any new symptoms or concerns. Patient grateful for the call.

## 2017-12-13 DIAGNOSIS — L97522 Non-pressure chronic ulcer of other part of left foot with fat layer exposed: Secondary | ICD-10-CM | POA: Diagnosis not present

## 2017-12-20 DIAGNOSIS — E538 Deficiency of other specified B group vitamins: Secondary | ICD-10-CM | POA: Diagnosis not present

## 2017-12-25 ENCOUNTER — Other Ambulatory Visit: Payer: Self-pay | Admitting: Cardiology

## 2018-01-09 DIAGNOSIS — L84 Corns and callosities: Secondary | ICD-10-CM | POA: Diagnosis not present

## 2018-01-09 DIAGNOSIS — I739 Peripheral vascular disease, unspecified: Secondary | ICD-10-CM | POA: Diagnosis not present

## 2018-01-09 DIAGNOSIS — L603 Nail dystrophy: Secondary | ICD-10-CM | POA: Diagnosis not present

## 2018-01-11 DIAGNOSIS — H401132 Primary open-angle glaucoma, bilateral, moderate stage: Secondary | ICD-10-CM | POA: Diagnosis not present

## 2018-01-11 DIAGNOSIS — H43813 Vitreous degeneration, bilateral: Secondary | ICD-10-CM | POA: Diagnosis not present

## 2018-01-11 DIAGNOSIS — R7303 Prediabetes: Secondary | ICD-10-CM | POA: Diagnosis not present

## 2018-01-21 DIAGNOSIS — E538 Deficiency of other specified B group vitamins: Secondary | ICD-10-CM | POA: Diagnosis not present

## 2018-02-19 DIAGNOSIS — D510 Vitamin B12 deficiency anemia due to intrinsic factor deficiency: Secondary | ICD-10-CM | POA: Diagnosis not present

## 2018-03-15 ENCOUNTER — Other Ambulatory Visit: Payer: Self-pay | Admitting: Cardiology

## 2018-03-15 NOTE — Telephone Encounter (Signed)
Xarelto 20mg  refill request received; pt is 84 yrs old, wt-91.8kg, Crea-1.16 on 12/01/17, last seen by Dr. Radford Pax on 10/18/17, CrCl-64.65ml/min; will send in refill to requested pharmacy.

## 2018-03-19 DIAGNOSIS — E538 Deficiency of other specified B group vitamins: Secondary | ICD-10-CM | POA: Diagnosis not present

## 2018-03-22 ENCOUNTER — Other Ambulatory Visit: Payer: Self-pay | Admitting: Cardiology

## 2018-04-05 DIAGNOSIS — E1151 Type 2 diabetes mellitus with diabetic peripheral angiopathy without gangrene: Secondary | ICD-10-CM | POA: Diagnosis not present

## 2018-04-05 DIAGNOSIS — L84 Corns and callosities: Secondary | ICD-10-CM | POA: Diagnosis not present

## 2018-04-05 DIAGNOSIS — L603 Nail dystrophy: Secondary | ICD-10-CM | POA: Diagnosis not present

## 2018-04-05 DIAGNOSIS — I739 Peripheral vascular disease, unspecified: Secondary | ICD-10-CM | POA: Diagnosis not present

## 2018-04-16 DIAGNOSIS — E538 Deficiency of other specified B group vitamins: Secondary | ICD-10-CM | POA: Diagnosis not present

## 2018-04-22 ENCOUNTER — Encounter: Payer: Self-pay | Admitting: Physician Assistant

## 2018-04-22 NOTE — Progress Notes (Signed)
Cardiology Office Note    Date:  04/23/2018  ID:  Cameron [name], DOB 23-May-1934, MRN [id number] PCP:  Gaynelle Arabian, MD  Cardiologist:  Fransico Him, MD   Chief Complaint: f/u afib  History of Present Illness:  Cameron [name] is a 84 y.o. male with history of permanentatrial fibrillation, chronic diastolic CHF, asymptomatic bradycardia, OSA on CPAP (followed by Dr. Radford Pax), borderline DM, probable CKD II, HTN, gout, peripheral neuropathy, rosacea, vitamin B12 deficiency (following colon surgery for benign mass), mild chronic thrombocytopenia who presents for 6 month f/u. Remote echo 2012 showed EF 56.5%, moderate LAE, mild MR, mild TR, mild PR. Nuclear stress test 10/2011 was normal. Last labs 11/2017 showed Hgb 13.7, plt 130, Na 3.7, BUN 21, Cr 1.16.  He presents back for follow-up feeling great. He denies any new cardiac symptoms. He continues to mow his own lawn and periodically exercises on an exercise bike without angina or functional limitation. No syncope or bleeding. He shares that he cared for his wife who had MS and she passed in 2015. He had over 19 months of sick leave when he retired from the [employer].   Past Medical History:  Diagnosis Date  . Acute cholecystitis 09/02/2014  . Anemia    hx of  . Arthritis   . Borderline diabetes    borderline  . Chronic anticoagulation   . Chronic diastolic (congestive) heart failure ([place])   . CKD (chronic kidney disease), stage II   . GERD (gastroesophageal reflux disease)   . Gout    in 70's, "not in toe but everywhere else"  . Hypertension   . IFG (impaired fasting glucose) 09/05/2014  . Low back pain    when lying flat  . Neuromuscular disorder ([place])   . OSA (obstructive sleep apnea)    moderate with AHI 17.53/hr now on CPAP at 7cm H2O  . Peripheral neuropathy    both feet, can feel pain  . Permanent atrial fibrillation ([place])   . Rosacea   . Swelling    both legs sometimes  . Vitamin B12 deficiency      Past Surgical History:  Procedure Laterality Date  . APPENDECTOMY     1997  . CARDIOVERSION     at least 5 times  . CARDIOVERSION N/A 10/24/2013   Procedure: CARDIOVERSION;  Surgeon: Sueanne Margarita, MD;  Location: [facility];  Service: Cardiovascular;  Laterality: N/A;  . CATARACT EXTRACTION Bilateral   . CHOLECYSTECTOMY N/A 11/16/2014   Procedure: ATTEMPTED LAPAROSCOPIC CHOLECYSTECTOMY TO OPEN CHOLECYSTECTOMY WITH LYSIS OF ADHESIONS - 30MINUTES;  Surgeon: Armandina Gemma, MD;  Location: [facility];  Service: General;  Laterality: N/A;  . COLON SURGERY    . EYE SURGERY    . HEMICOLECTOMY  03/1996   with 3 lymph nodes  . HERNIA REPAIR    . INGUINAL HERNIA REPAIR Left 08/21/2013   Procedure:  LEFT INGUINAL HERNIA REPAIR WITH MESH;  Surgeon: Earnstine Regal, MD;  [facility];  Service: General;  Laterality: Left;  . INGUINAL HERNIA REPAIR Right 01/29/2014   Procedure:  REPAIR RIGHT INGUNIAL HERNIA WITH MESH;  Surgeon: Earnstine Regal, MD;  [facility];  Service: General;  Laterality: Right;  . INSERTION OF MESH Left 08/21/2013   Procedure: INSERTION OF MESH;  Surgeon: Earnstine Regal, MD;  [facility];  Service: General;  Laterality: Left;  . TUMOR REMOVAL Right 1961   back of leg    Current Medications: Current Meds  Medication Sig  . bimatoprost (LUMIGAN) 0.01 % SOLN Place 1 drop into both eyes at bedtime.   . cyanocobalamin (,VITAMIN B-12,) 1000 MCG/ML injection Inject 1,000 mcg into the muscle every 30 (thirty) days.   Marland Kitchen doxazosin (CARDURA) 4 MG tablet TAKE 1 TABLET(4 MG) BY MOUTH DAILY  . folic acid (FOLVITE) 1 MG tablet Take 1 mg by mouth daily.   . furosemide (LASIX) 40 MG tablet TAKE 1 TABLET BY MOUTH ONCE DAILY. MAY TAKE EXTRA TABLET DAILY AS NEEDED FOR SWELLING IN LOWER EXTREMITIES  . meloxicam (MOBIC) 15 MG tablet Take 15 mg by mouth daily as needed for pain.   . metoprolol succinate (TOPROL-XL) 25 MG 24 hr tablet TAKE 1 TABLET(25 MG) BY MOUTH DAILY  . traMADol  (ULTRAM) 50 MG tablet Take 50 mg by mouth every 4 (four) hours as needed for moderate pain or severe pain. Maximum dose= 8 tablets per day  . XARELTO 20 MG TABS tablet TAKE 1 TABLET DAILY      Allergies:   Ace inhibitors and Clonidine derivatives   Social History   Socioeconomic History  . Marital status: Widowed    Spouse name: Not on file  . Number of children: Not on file  . Years of education: Not on file  . Highest education level: Not on file  Occupational History  . Occupation: Retired   Scientific laboratory technician  . Financial resource strain: Not on file  . Food insecurity:    Worry: Not on file    Inability: Not on file  . Transportation needs:    Medical: Not on file    Non-medical: Not on file  Tobacco Use  . Smoking status: Former Smoker    Packs/day: 2.00    Years: 20.00    Pack years: 40.00    Types: Cigarettes    Last attempt to quit: 10/02/1974    Years since quitting: 43.5  . Smokeless tobacco: Never Used  Substance and Sexual Activity  . Alcohol use: Yes    Alcohol/week: 0.0 oz    Comment: occasionally  . Drug use: No  . Sexual activity: Not on file  Lifestyle  . Physical activity:    Days per week: Not on file    Minutes per session: Not on file  . Stress: Not on file  Relationships  . Social connections:    Talks on phone: Not on file    Gets together: Not on file    Attends religious service: Not on file    Active member of club or organization: Not on file    Attends meetings of clubs or organizations: Not on file    Relationship status: Not on file  Other Topics Concern  . Not on file  Social History Narrative   Pt lives in [place] with spouse.   Retired in 1999 from the [employer].  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           Family History:  The patient's family history includes Allergies in his daughter; Asthma in his daughter; Cancer in his father; Dementia in his sister; Stroke (age  of onset: 85) in his mother.  ROS:   Please see the history of present illness.  All other systems are reviewed and otherwise negative.    PHYSICAL EXAM:   VS:  BP 136/64   Pulse 79   Ht 5\' 8"  (1.727 m)   Wt 197 lb (89.4 kg)   SpO2 98%  BMI 29.95 kg/m   BMI: Body mass index is 29.95 kg/m. GEN: Well nourished, well developed WM, in no acute distress HEENT: normocephalic, atraumatic Neck: no JVD, carotid bruits, or masses Cardiac: irregularly irregular; no murmurs, rubs, or gallops, no edema  Respiratory:  clear to auscultation bilaterally, normal work of breathing GI: soft, nontender, nondistended, + BS MS: no deformity or atrophy Skin: warm and dry, no rash Neuro:  Alert and Oriented x 3, Strength and sensation are intact, follows commands Psych: euthymic mood, full affect  Wt Readings from Last 3 Encounters:  04/23/18 197 lb (89.4 kg)  12/01/17 202 lb (91.6 kg)  10/18/17 202 lb 6.4 oz (91.8 kg)      Studies/Labs Reviewed:   EKG:  EKG was ordered today and personally reviewed by me and demonstrates atrial fib 79bpm, no acute ST-T changes, stable from prior.  Recent Labs: 12/01/2017: BUN 21; Creatinine, Ser 1.16; Hemoglobin 13.7; Platelets 130; Potassium 3.7; Sodium 136   Lipid Panel No results found for: CHOL, TRIG, HDL, CHOLHDL, VLDL, LDLCALC, LDLDIRECT  Additional studies/ records that were reviewed today include: Summarized above   ASSESSMENT & PLAN:   1. Permanent atrial fibrillation - rate controlled, feeling well. CrCl was checked and is appropriate for current Xarelto dose. No bleeding. Last CBC in 11/2017 was stable (has mild chronic intermittent thrombocytopenia - advised to f/u PCP for this). 2. Chronic diastolic CHF - appears euvolemic. He continues with Lasix daily and takes extra tablet PRN. Information given on 2g sodium restriction, 2L fluid restriction, daily weights. 3. History of bradycardia - quiescent. Stable. Follow. 4. Essential HTN - BP is  acceptable today. By phone notes in 11/2017 he was following at home and was running softer than office pressures, so no changes made today.  Disposition: F/u with Dr. Radford Pax in 6 months.   Medication Adjustments/Labs and Tests Ordered: Current medicines are reviewed at length with the patient today.  Concerns regarding medicines are outlined above. Medication changes, Labs and Tests ordered today are summarized above and listed in the Patient Instructions accessible in Encounters.   Signed, Charlie Pitter, PA-C  04/23/2018 8:24 AM    [address], [place], [place]  [address] Phone: [phone]; Fax: [phone]

## 2018-04-23 ENCOUNTER — Ambulatory Visit (INDEPENDENT_AMBULATORY_CARE_PROVIDER_SITE_OTHER): Payer: Medicare Other | Admitting: Physician Assistant

## 2018-04-23 ENCOUNTER — Encounter: Payer: Self-pay | Admitting: Physician Assistant

## 2018-04-23 VITALS — BP 136/64 | HR 79 | Ht 68.0 in | Wt 197.0 lb

## 2018-04-23 DIAGNOSIS — I10 Essential (primary) hypertension: Secondary | ICD-10-CM

## 2018-04-23 DIAGNOSIS — Z8679 Personal history of other diseases of the circulatory system: Secondary | ICD-10-CM

## 2018-04-23 DIAGNOSIS — I482 Chronic atrial fibrillation: Secondary | ICD-10-CM | POA: Diagnosis not present

## 2018-04-23 DIAGNOSIS — I5032 Chronic diastolic (congestive) heart failure: Secondary | ICD-10-CM | POA: Diagnosis not present

## 2018-04-23 DIAGNOSIS — I4821 Permanent atrial fibrillation: Secondary | ICD-10-CM

## 2018-04-23 DIAGNOSIS — Z87898 Personal history of other specified conditions: Secondary | ICD-10-CM

## 2018-04-23 NOTE — Patient Instructions (Addendum)
Medication Instructions:  Your physician recommends that you continue on your current medications as directed. Please refer to the Current Medication list given to you today.   Labwork: None ordered  Testing/Procedures: None ordered  Follow-Up: Your physician wants you to follow-up in: [place] [address]. Mallie Snooks will receive a reminder letter in the mail two months in advance. If you don't receive a letter, please call our office to schedule the follow-up appointment.   Any Other Special Instructions Will Be Listed Below (If Applicable). Please continue to follow up with your Primary Care Physician for your borderline low platelet count.  For patients with history of fluid retention, we give them these special instructions:  1. Follow a low-salt diet - you are allowed no more than 2,000mg  of sodium per day. Watch your fluid intake. In general, you should not be taking in more than 2 liters of fluid per day (no more than 8 glasses per day). This includes sources of water in foods like soup, coffee, tea, milk, etc. 2. Weigh yourself on the same scale at same time of day and keep a log. 3. Call your doctor: (Anytime you feel any of the following symptoms)  - 3lb weight gain overnight or 5lb within a few days - Shortness of breath, with or without a dry hacking cough  - Swelling in the hands, feet or stomach  - If you have to sleep on extra pillows at night in order to breathe   IT IS IMPORTANT TO LET YOUR DOCTOR KNOW EARLY ON IF YOU ARE HAVING SYMPTOMS SO WE CAN HELP YOU!        If you need a refill on your cardiac medications before your next appointment, please call your pharmacy.

## 2018-05-14 DIAGNOSIS — E538 Deficiency of other specified B group vitamins: Secondary | ICD-10-CM | POA: Diagnosis not present

## 2018-06-11 DIAGNOSIS — Z23 Encounter for immunization: Secondary | ICD-10-CM | POA: Diagnosis not present

## 2018-06-11 DIAGNOSIS — E538 Deficiency of other specified B group vitamins: Secondary | ICD-10-CM | POA: Diagnosis not present

## 2018-07-05 DIAGNOSIS — L84 Corns and callosities: Secondary | ICD-10-CM | POA: Diagnosis not present

## 2018-07-05 DIAGNOSIS — L603 Nail dystrophy: Secondary | ICD-10-CM | POA: Diagnosis not present

## 2018-07-05 DIAGNOSIS — I739 Peripheral vascular disease, unspecified: Secondary | ICD-10-CM | POA: Diagnosis not present

## 2018-07-09 DIAGNOSIS — E538 Deficiency of other specified B group vitamins: Secondary | ICD-10-CM | POA: Diagnosis not present

## 2018-07-15 DIAGNOSIS — H401122 Primary open-angle glaucoma, left eye, moderate stage: Secondary | ICD-10-CM | POA: Diagnosis not present

## 2018-07-15 DIAGNOSIS — H43813 Vitreous degeneration, bilateral: Secondary | ICD-10-CM | POA: Diagnosis not present

## 2018-07-15 DIAGNOSIS — E119 Type 2 diabetes mellitus without complications: Secondary | ICD-10-CM | POA: Diagnosis not present

## 2018-07-15 DIAGNOSIS — H401112 Primary open-angle glaucoma, right eye, moderate stage: Secondary | ICD-10-CM | POA: Diagnosis not present

## 2018-08-06 DIAGNOSIS — E538 Deficiency of other specified B group vitamins: Secondary | ICD-10-CM | POA: Diagnosis not present

## 2018-09-03 DIAGNOSIS — D510 Vitamin B12 deficiency anemia due to intrinsic factor deficiency: Secondary | ICD-10-CM | POA: Diagnosis not present

## 2018-09-18 ENCOUNTER — Other Ambulatory Visit: Payer: Self-pay | Admitting: Cardiology

## 2018-10-09 DIAGNOSIS — L603 Nail dystrophy: Secondary | ICD-10-CM | POA: Diagnosis not present

## 2018-10-09 DIAGNOSIS — L84 Corns and callosities: Secondary | ICD-10-CM | POA: Diagnosis not present

## 2018-10-09 DIAGNOSIS — I739 Peripheral vascular disease, unspecified: Secondary | ICD-10-CM | POA: Diagnosis not present

## 2018-10-23 DIAGNOSIS — E538 Deficiency of other specified B group vitamins: Secondary | ICD-10-CM | POA: Diagnosis not present

## 2018-10-23 DIAGNOSIS — I10 Essential (primary) hypertension: Secondary | ICD-10-CM | POA: Diagnosis not present

## 2018-10-23 DIAGNOSIS — E1149 Type 2 diabetes mellitus with other diabetic neurological complication: Secondary | ICD-10-CM | POA: Diagnosis not present

## 2018-10-23 DIAGNOSIS — E1169 Type 2 diabetes mellitus with other specified complication: Secondary | ICD-10-CM | POA: Diagnosis not present

## 2018-10-23 DIAGNOSIS — H409 Unspecified glaucoma: Secondary | ICD-10-CM | POA: Diagnosis not present

## 2018-10-23 DIAGNOSIS — G629 Polyneuropathy, unspecified: Secondary | ICD-10-CM | POA: Diagnosis not present

## 2018-10-23 DIAGNOSIS — D510 Vitamin B12 deficiency anemia due to intrinsic factor deficiency: Secondary | ICD-10-CM | POA: Diagnosis not present

## 2018-10-23 DIAGNOSIS — D692 Other nonthrombocytopenic purpura: Secondary | ICD-10-CM | POA: Diagnosis not present

## 2018-10-23 DIAGNOSIS — Z1389 Encounter for screening for other disorder: Secondary | ICD-10-CM | POA: Diagnosis not present

## 2018-10-23 DIAGNOSIS — E781 Pure hyperglyceridemia: Secondary | ICD-10-CM | POA: Diagnosis not present

## 2018-10-23 DIAGNOSIS — I4891 Unspecified atrial fibrillation: Secondary | ICD-10-CM | POA: Diagnosis not present

## 2018-10-23 DIAGNOSIS — Z0000 Encounter for general adult medical examination without abnormal findings: Secondary | ICD-10-CM | POA: Diagnosis not present

## 2018-11-08 ENCOUNTER — Ambulatory Visit (INDEPENDENT_AMBULATORY_CARE_PROVIDER_SITE_OTHER): Payer: Medicare Other | Admitting: Cardiology

## 2018-11-08 ENCOUNTER — Encounter: Payer: Self-pay | Admitting: Cardiology

## 2018-11-08 VITALS — BP 140/66 | HR 74 | Ht 68.0 in | Wt 198.4 lb

## 2018-11-08 DIAGNOSIS — I4821 Permanent atrial fibrillation: Secondary | ICD-10-CM

## 2018-11-08 DIAGNOSIS — I5032 Chronic diastolic (congestive) heart failure: Secondary | ICD-10-CM

## 2018-11-08 DIAGNOSIS — I10 Essential (primary) hypertension: Secondary | ICD-10-CM | POA: Diagnosis not present

## 2018-11-08 DIAGNOSIS — G4733 Obstructive sleep apnea (adult) (pediatric): Secondary | ICD-10-CM | POA: Diagnosis not present

## 2018-11-08 NOTE — Progress Notes (Signed)
Cardiology Office Note:    Date:  11/08/2018   ID:  CHANE COWDEN, DOB [birth year], MRN [id number]  PCP:  Gaynelle Arabian, MD  Cardiologist:  Fransico Him, MD    Referring MD: Gaynelle Arabian, MD   Chief Complaint  Patient presents with  . Atrial Fibrillation  . Hypertension  . Congestive Heart Failure    History of Present Illness:    Cameron [name] is a 85 y.o. male with a hx of permanentatrial fibrillation, chronic diastolic CHF, asymptomatic bradycardia, systemic anticoagulation and OSA on CPAP.  He has moderate OSA with an AHI of 17.53/hr and is on CPAP at 7cm H2O.   He is here today for followup and is doing well.  He denies any chest pain or pressure, SOB, DOE, PND, orthopnea, LE edema, dizziness, palpitations or syncope. He is compliant with his meds and is tolerating meds with no SE.  He is doing well with his CPAP device and thinks that he has gotten used to it.  He tolerates the mask and feels the pressure is adequate.  Since going on CPAP he feels rested in the am and has no significant daytime sleepiness.  He denies any significant mouth or nasal dryness or nasal congestion.  He does not think that he snores.     Past Medical History:  Diagnosis Date  . Acute cholecystitis 09/02/2014  . Anemia    hx of  . Arthritis   . Borderline diabetes    borderline  . Chronic anticoagulation   . Chronic diastolic (congestive) heart failure ([place])   . CKD (chronic kidney disease), stage II   . GERD (gastroesophageal reflux disease)   . Gout    in 70's, "not in toe but everywhere else"  . Hypertension   . IFG (impaired fasting glucose) 09/05/2014  . Low back pain    when lying flat  . Neuromuscular disorder ([place])   . OSA (obstructive sleep apnea)    moderate with AHI 17.53/hr now on CPAP at 7cm H2O  . Peripheral neuropathy    both feet, can feel pain  . Permanent atrial fibrillation   . Rosacea   . Swelling    both legs sometimes  . Vitamin B12 deficiency     Past  Surgical History:  Procedure Laterality Date  . APPENDECTOMY     1997  . CARDIOVERSION     at least 5 times  . CARDIOVERSION N/A 10/24/2013   Procedure: CARDIOVERSION;  Surgeon: Sueanne Margarita, MD;  Location: [facility];  Service: Cardiovascular;  Laterality: N/A;  . CATARACT EXTRACTION Bilateral   . CHOLECYSTECTOMY N/A 11/16/2014   Procedure: ATTEMPTED LAPAROSCOPIC CHOLECYSTECTOMY TO OPEN CHOLECYSTECTOMY WITH LYSIS OF ADHESIONS - 30MINUTES;  Surgeon: Armandina Gemma, MD;  Location: [facility];  Service: General;  Laterality: N/A;  . COLON SURGERY    . EYE SURGERY    . HEMICOLECTOMY  03/1996   with 3 lymph nodes  . HERNIA REPAIR    . INGUINAL HERNIA REPAIR Left 08/21/2013   Procedure:  LEFT INGUINAL HERNIA REPAIR WITH MESH;  Surgeon: Earnstine Regal, MD;  Location[facility];  Service: General;  Laterality: Left;  . INGUINAL HERNIA REPAIR Right 01/29/2014   Procedure:  REPAIR RIGHT INGUNIAL HERNIA WITH MESH;  Surgeon: Earnstine Regal, MD;  [facility];  Service: General;  Laterality: Right;  . INSERTION OF MESH Left 08/21/2013   Procedure: INSERTION OF MESH;  Surgeon: Earnstine Regal, MD;  [facility];  Service:  General;  Laterality: Left;  . TUMOR REMOVAL Right 1961   back of leg    Current Medications: Current Meds  Medication Sig  . bimatoprost (LUMIGAN) 0.01 % SOLN Place 1 drop into both eyes at bedtime.   . cyanocobalamin (,VITAMIN B-12,) 1000 MCG/ML injection Inject 1,000 mcg into the muscle every 6 (six) weeks.   . doxazosin (CARDURA) 4 MG tablet TAKE 1 TABLET(4 MG) BY MOUTH DAILY  . folic acid (FOLVITE) 1 MG tablet Take 1 mg by mouth daily.   . furosemide (LASIX) 40 MG tablet TAKE 1 TABLET BY MOUTH ONCE DAILY. MAY TAKE EXTRA TABLET DAILY AS NEEDED FOR SWELLING IN LOWER EXTREMITIES  . meloxicam (MOBIC) 15 MG tablet Take 15 mg by mouth daily as needed for pain.   . metoprolol succinate (TOPROL-XL) 25 MG 24 hr tablet TAKE 1 TABLET(25 MG) BY MOUTH DAILY  . rivaroxaban (XARELTO) 20 MG  TABS tablet Take 20 mg by mouth daily with supper.  . traMADol (ULTRAM) 50 MG tablet Take 50 mg by mouth every 4 (four) hours as needed for moderate pain or severe pain. Maximum dose= 8 tablets per day     Allergies:   Ace inhibitors and Clonidine derivatives   Social History   Socioeconomic History  . Marital status: Widowed    Spouse name: Not on file  . Number of children: Not on file  . Years of education: Not on file  . Highest education level: Not on file  Occupational History  . Occupation: Retired   Scientific laboratory technician  . Financial resource strain: Not on file  . Food insecurity:    Worry: Not on file    Inability: Not on file  . Transportation needs:    Medical: Not on file    Non-medical: Not on file  Tobacco Use  . Smoking status: Former Smoker    Packs/day: 2.00    Years: 20.00    Pack years: 40.00    Types: Cigarettes    Last attempt to quit: 10/02/1974    Years since quitting: 44.1  . Smokeless tobacco: Never Used  Substance and Sexual Activity  . Alcohol use: Yes    Alcohol/week: 0.0 standard drinks    Comment: occasionally  . Drug use: No  . Sexual activity: Not on file  Lifestyle  . Physical activity:    Days per week: Not on file    Minutes per session: Not on file  . Stress: Not on file  Relationships  . Social connections:    Talks on phone: Not on file    Gets together: Not on file    Attends religious service: Not on file    Active member of club or organization: Not on file    Attends meetings of clubs or organizations: Not on file    Relationship status: Not on file  Other Topics Concern  . Not on file  Social History Narrative   Pt lives in [place] with spouse.   Retired in 1999 from the [employer].  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           Family History: The patient's family history includes Allergies in his daughter; Asthma in his daughter; Cancer in his father; Dementia in  his sister; Stroke (age of onset: 63) in his mother.  ROS:   Please see the history of present illness.    ROS  All other systems reviewed and negative.   EKGs/Labs/Other Studies  Reviewed:    The following studies were reviewed today: PAP download  EKG:  EKG is not ordered today.    Recent Labs: 12/01/2017: BUN 21; Creatinine, Ser 1.16; Hemoglobin 13.7; Platelets 130; Potassium 3.7; Sodium 136   Recent Lipid Panel No results found for: CHOL, TRIG, HDL, CHOLHDL, VLDL, LDLCALC, LDLDIRECT  Physical Exam:    VS:  BP 140/66   Pulse 74   Ht 5\' 8"  (1.727 m)   Wt 198 lb 6.4 oz (90 kg)   SpO2 99%   BMI 30.17 kg/m     Wt Readings from Last 3 Encounters:  11/08/18 198 lb 6.4 oz (90 kg)  04/23/18 197 lb (89.4 kg)  12/01/17 202 lb (91.6 kg)     GEN:  Well nourished, well developed in no acute distress HEENT: Normal NECK: No JVD; No carotid bruits LYMPHATICS: No lymphadenopathy CARDIAC: RRR, no murmurs, rubs, gallops RESPIRATORY:  Clear to auscultation without rales, wheezing or rhonchi  ABDOMEN: Soft, non-tender, non-distended MUSCULOSKELETAL:  No edema; No deformity  SKIN: Warm and dry NEUROLOGIC:  Alert and oriented x 3 PSYCHIATRIC:  Normal affect   ASSESSMENT:    1. Permanent atrial fibrillation   2. Essential hypertension   3. Chronic diastolic heart failure ([place])   4. OSA (obstructive sleep apnea)    PLAN:    In order of problems listed above:  1.  Permanent atrial fibrillation -heart rate is well controlled on exam today.  He will continue on Toprol-XL 25 mg daily and Xarelto 20 mg daily.  Hemoglobin was 13.7 and creatinine 1.19 on 10/23/2018.  Creatinine clearance is 60.49 cc/min.  2.  Hypertension -BP is well controlled on exam today.  He will continue on doxazosin 4 mg daily, Toprol-XL 25 mg daily.  3.  Chronic diastolic CHF -he appears euvolemic on exam today.  His weight is stable.  His creatinine was 1.19 and potassium 3.7 10/23/2018.  He will continue on  Lasix 40 mg daily.  4.  OSA - the patient is tolerating PAP therapy well without any problems. The PAP download was reviewed today and showed an AHI of 5.7/hr on 7 cm H2O with 93% compliance in using more than 4 hours nightly.  The patient has been using and benefiting from PAP use and will continue to benefit from therapy.     Medication Adjustments/Labs and Tests Ordered: Current medicines are reviewed at length with the patient today.  Concerns regarding medicines are outlined above.  No orders of the defined types were placed in this encounter.  No orders of the defined types were placed in this encounter.   Signed, Fransico Him, MD  11/08/2018 8:32 AM    [place]

## 2018-11-08 NOTE — Patient Instructions (Signed)
Medication Instructions:  Your physician recommends that you continue on your current medications as directed. Please refer to the Current Medication list given to you today.  If you need a refill on your cardiac medications before your next appointment, please call your pharmacy.   Lab work: None If you have labs (blood work) drawn today and your tests are completely normal, you will receive your results only by: . MyChart Message (if you have MyChart) OR . A paper copy in the mail If you have any lab test that is abnormal or we need to change your treatment, we will call you to review the results.  Testing/Procedures: None  Follow-Up: At [facility], you and your health needs are our priority.  As part of our continuing mission to provide you with exceptional heart care, we have created designated Provider Care Teams.  These Care Teams include your primary Cardiologist (physician) and Advanced Practice Providers (APPs -  Physician Assistants and Nurse Practitioners) who all work together to provide you with the care you need, when you need it. You will need a follow up appointment in 6 months.  Please call our office 2 months in advance to schedule this appointment.  You may see Traci Turner, MD or one of the following Advanced Practice Providers on your designated Care Team:   Brittainy Simmons, PA-C Dayna Dunn, PA-C . Michele Lenze, PA-C     

## 2018-11-20 DIAGNOSIS — E538 Deficiency of other specified B group vitamins: Secondary | ICD-10-CM | POA: Diagnosis not present

## 2018-12-24 ENCOUNTER — Other Ambulatory Visit: Payer: Self-pay | Admitting: Cardiology

## 2018-12-24 NOTE — Telephone Encounter (Signed)
85 yo, 198 lbs, scr 1.19 on 10/23/18  Crcl 27ml/min Last OV 11/08/18

## 2019-01-01 ENCOUNTER — Telehealth: Payer: Self-pay | Admitting: Cardiology

## 2019-01-01 DIAGNOSIS — E538 Deficiency of other specified B group vitamins: Secondary | ICD-10-CM | POA: Diagnosis not present

## 2019-01-01 NOTE — Telephone Encounter (Signed)
New message     STAT if HR is under 50 or over 120 (normal HR is 60-100 beats per minute)  1) What is your heart rate? 55 heart rate today, patient states that he is on a drug to regulate his hr    2) Do you have a log of your heart rate readings (document readings)? No   3) Do you have any other symptoms? No

## 2019-01-02 NOTE — Telephone Encounter (Signed)
Pt concerned with heart rate in the 50's occasionally. Pt states his bp is fine but does not have a record of it and can't recall numbers but states it was good. Current spo2 reads 96% on RA, p 68. Pt denies having dizziness, no SOB. Pt has been mowing the lawn and getting his  Usual exercise without concern. Pt was told to call with dizziness or SOB or further concern, pt was accepting of advice and concerns relieved at end of conversation.

## 2019-01-02 NOTE — Telephone Encounter (Signed)
I have called x 2 / mltcb, to see what is further needed. The original  message is not clear.

## 2019-01-02 NOTE — Telephone Encounter (Signed)
° °  Patient has questions about HR. Patient wants to know when he should be concerned; low HR NO symptoms     1) What is your heart rate? 60  2) Do you have a log of your heart rate readings (document readings)? N/A  3) Do you have any other symptoms? Patient has questions about HR and afib

## 2019-02-12 DIAGNOSIS — D510 Vitamin B12 deficiency anemia due to intrinsic factor deficiency: Secondary | ICD-10-CM | POA: Diagnosis not present

## 2019-02-12 DIAGNOSIS — E538 Deficiency of other specified B group vitamins: Secondary | ICD-10-CM | POA: Diagnosis not present

## 2019-03-17 ENCOUNTER — Other Ambulatory Visit: Payer: Self-pay | Admitting: Cardiology

## 2019-03-26 DIAGNOSIS — E538 Deficiency of other specified B group vitamins: Secondary | ICD-10-CM | POA: Diagnosis not present

## 2019-04-09 DIAGNOSIS — H401112 Primary open-angle glaucoma, right eye, moderate stage: Secondary | ICD-10-CM | POA: Diagnosis not present

## 2019-04-09 DIAGNOSIS — E119 Type 2 diabetes mellitus without complications: Secondary | ICD-10-CM | POA: Diagnosis not present

## 2019-04-09 DIAGNOSIS — H26492 Other secondary cataract, left eye: Secondary | ICD-10-CM | POA: Diagnosis not present

## 2019-04-09 DIAGNOSIS — H52203 Unspecified astigmatism, bilateral: Secondary | ICD-10-CM | POA: Diagnosis not present

## 2019-05-07 DIAGNOSIS — E538 Deficiency of other specified B group vitamins: Secondary | ICD-10-CM | POA: Diagnosis not present

## 2019-05-27 ENCOUNTER — Encounter: Payer: Self-pay | Admitting: Cardiology

## 2019-05-27 ENCOUNTER — Ambulatory Visit (INDEPENDENT_AMBULATORY_CARE_PROVIDER_SITE_OTHER): Payer: Medicare Other | Admitting: Cardiology

## 2019-05-27 VITALS — BP 127/78 | HR 55

## 2019-05-27 DIAGNOSIS — I4821 Permanent atrial fibrillation: Secondary | ICD-10-CM | POA: Diagnosis not present

## 2019-05-27 DIAGNOSIS — Z7901 Long term (current) use of anticoagulants: Secondary | ICD-10-CM

## 2019-05-27 DIAGNOSIS — Z87891 Personal history of nicotine dependence: Secondary | ICD-10-CM | POA: Diagnosis not present

## 2019-05-27 DIAGNOSIS — I5032 Chronic diastolic (congestive) heart failure: Secondary | ICD-10-CM

## 2019-05-27 DIAGNOSIS — I110 Hypertensive heart disease with heart failure: Secondary | ICD-10-CM | POA: Diagnosis not present

## 2019-05-27 DIAGNOSIS — G4733 Obstructive sleep apnea (adult) (pediatric): Secondary | ICD-10-CM | POA: Diagnosis not present

## 2019-05-27 MED ORDER — XARELTO 20 MG PO TABS: 20.0000 mg | ORAL_TABLET | Freq: Every day | ORAL | 3 refills | Status: DC

## 2019-05-27 MED ORDER — METOPROLOL SUCCINATE ER 25 MG PO TB24: ORAL_TABLET | ORAL | 3 refills | Status: DC

## 2019-05-27 MED ORDER — DOXAZOSIN MESYLATE 4 MG PO TABS: ORAL_TABLET | ORAL | 3 refills | Status: DC

## 2019-05-27 MED ORDER — FUROSEMIDE 40 MG PO TABS: ORAL_TABLET | ORAL | 3 refills | Status: DC

## 2019-05-27 NOTE — Progress Notes (Signed)
Virtual Visit via Telephone Note   This visit type was conducted due to national recommendations for restrictions regarding the COVID-19 Pandemic (e.g. social distancing) in an effort to limit this patient's exposure and mitigate transmission in our community.  Due to his co-morbid illnesses, this patient is at least at moderate risk for complications without adequate follow up.  This format is felt to be most appropriate for this patient at this time.  All issues noted in this document were discussed and addressed.  A limited physical exam was performed with this format.  Please refer to the patient's chart for his consent to telehealth for [facility].   Evaluation Performed:  Follow-up visit  This visit type was conducted due to national recommendations for restrictions regarding the COVID-19 Pandemic (e.g. social distancing).  This format is felt to be most appropriate for this patient at this time.  All issues noted in this document were discussed and addressed.  No physical exam was performed (except for noted visual exam findings with Video Visits).  Please refer to the patient's chart (MyChart message for video visits and phone note for telephone visits) for the patient's consent to telehealth for [facility].  Date:  05/27/2019   ID:  BRETLEY MENDILLO, DOB [birth year], MRN [id number]  Patient Location:  Home  Provider location:   [place]  PCP:  Gaynelle Arabian, MD  Cardiologist:  Fransico Him, MD  Electrophysiologist:  None   Chief Complaint:  Atrial fibrillation, HTN, CHF  History of Present Illness:    Cameron [name] is a 85 y.o. male who presents via audio/video conferencing for a telehealth visit today.    Cameron [name] is a 85 y.o. male with a hx of permanentatrial fibrillation,chronic diastolic CHF,asymptomatic bradycardia, systemic anticoagulation and OSAon CPAP. He has moderate OSA with an AHI of 17.53/hr and is on CPAP at 7cm H2O.  He is here today for  followup and is doing well.  He denies any chest pain or pressure, SOB, DOE, PND, orthopnea, LE edema, dizziness, palpitations or syncope. He is compliant with his meds and is tolerating meds with no SE.  He is doing well with his CPAP device and thinks that he has gotten used to it.  He tolerates the mask and feels the pressure is adequate.  Since going on CPAP he feels rested in the am and has no significant daytime sleepiness.  He denies any significant mouth or nasal dryness or nasal congestion.  He does not think that he snores.    The patient does not have symptoms concerning for COVID-19 infection (fever, chills, cough, or new shortness of breath).   Prior CV studies:   The following studies were reviewed today:  PAP compliance download  Past Medical History:  Diagnosis Date  . Acute cholecystitis 09/02/2014  . Anemia    hx of  . Arthritis   . Borderline diabetes    borderline  . Chronic anticoagulation   . Chronic diastolic (congestive) heart failure ([place])   . CKD (chronic kidney disease), stage II   . GERD (gastroesophageal reflux disease)   . Gout    in 70's, "not in toe but everywhere else"  . Hypertension   . IFG (impaired fasting glucose) 09/05/2014  . Low back pain    when lying flat  . Neuromuscular disorder ([place])   . OSA (obstructive sleep apnea)    moderate with AHI 17.53/hr now on CPAP at 7cm H2O  . Peripheral neuropathy  both feet, can feel pain  . Permanent atrial fibrillation   . Rosacea   . Swelling    both legs sometimes  . Vitamin B12 deficiency    Past Surgical History:  Procedure Laterality Date  . APPENDECTOMY     1997  . CARDIOVERSION     at least 5 times  . CARDIOVERSION N/A 10/24/2013   Procedure: CARDIOVERSION;  Surgeon: Sueanne Margarita, MD;  Location: [facility];  Service: Cardiovascular;  Laterality: N/A;  . CATARACT EXTRACTION Bilateral   . CHOLECYSTECTOMY N/A 11/16/2014   Procedure: ATTEMPTED LAPAROSCOPIC CHOLECYSTECTOMY TO OPEN  CHOLECYSTECTOMY WITH LYSIS OF ADHESIONS - 30MINUTES;  Surgeon: Armandina Gemma, MD;  Location: [facility];  Service: General;  Laterality: N/A;  . COLON SURGERY    . EYE SURGERY    . HEMICOLECTOMY  03/1996   with 3 lymph nodes  . HERNIA REPAIR    . INGUINAL HERNIA REPAIR Left 08/21/2013   Procedure:  LEFT INGUINAL HERNIA REPAIR WITH MESH;  Surgeon: Earnstine Regal, MD;  [facility];  Service: General;  Laterality: Left;  . INGUINAL HERNIA REPAIR Right 01/29/2014   Procedure:  REPAIR RIGHT INGUNIAL HERNIA WITH MESH;  Surgeon: Earnstine Regal, MD;  [facility];  Service: General;  Laterality: Right;  . INSERTION OF MESH Left 08/21/2013   Procedure: INSERTION OF MESH;  Surgeon: Earnstine Regal, MD;  [facility];  Service: General;  Laterality: Left;  . TUMOR REMOVAL Right 1961   back of leg     Current Meds  Medication Sig  . bimatoprost (LUMIGAN) 0.01 % SOLN Place 1 drop into both eyes at bedtime.   . cyanocobalamin (,VITAMIN B-12,) 1000 MCG/ML injection Inject 1,000 mcg into the muscle every 6 (six) weeks.   . doxazosin (CARDURA) 4 MG tablet TAKE 1 TABLET(4 MG) BY MOUTH DAILY  . folic acid (FOLVITE) 1 MG tablet Take 1 mg by mouth daily.   . furosemide (LASIX) 40 MG tablet TAKE 1 TABLET BY MOUTH ONCE DAILY. MAY TAKE EXTRA TABLET DAILY AS NEEDED FOR SWELLING IN LOWER EXTREMITIES  . meloxicam (MOBIC) 15 MG tablet Take 15 mg by mouth daily as needed for pain.   . metoprolol succinate (TOPROL-XL) 25 MG 24 hr tablet TAKE 1 TABLET(25 MG) BY MOUTH DAILY  . traMADol (ULTRAM) 50 MG tablet Take 50 mg by mouth every 4 (four) hours as needed for moderate pain or severe pain. Maximum dose= 8 tablets per day  . XARELTO 20 MG TABS tablet TAKE 1 TABLET DAILY     Allergies:   Ace inhibitors and Clonidine derivatives   Social History   Tobacco Use  . Smoking status: Former Smoker    Packs/day: 2.00    Years: 20.00    Pack years: 40.00    Types: Cigarettes    Quit date: 10/02/1974    Years since  quitting: 44.6  . Smokeless tobacco: Never Used  Substance Use Topics  . Alcohol use: Yes    Alcohol/week: 0.0 standard drinks    Comment: occasionally  . Drug use: No     Family Hx: The patient's family history includes Allergies in his daughter; Asthma in his daughter; Cancer in his father; Dementia in his sister; Stroke ([name] of onset: 80) in his mother.  ROS:   Please see the history of present illness.     All other systems reviewed and are negative.   Labs/Other Tests and Data Reviewed:    Recent Labs: No results found  for requested labs within last 8760 hours.   Recent Lipid Panel No results found for: CHOL, TRIG, HDL, CHOLHDL, LDLCALC, LDLDIRECT  Wt Readings from Last 3 Encounters:  11/08/18 198 lb 6.4 oz (90 kg)  04/23/18 197 lb (89.4 kg)  12/01/17 202 lb (91.6 kg)     Objective:    Vital Signs:  BP 127/78   Pulse (!) 55   SpO2 99%    ASSESSMENT & PLAN:    1.  OSA -  The patient is tolerating PAP therapy well without any problems. The PAP download was reviewed today and showed an AHI of 7.8/hr on 7 cm H2O with 60% compliance in using more than 4 hours nightly.  The patient has been using and benefiting from PAP use and will continue to benefit from therapy. His AHI is elevated but I suspect it was due to a mask leak and he is getting a new cushion today for his mask.  2.  Permanent atrial fibrillation - his HR is well controlled.  He has not had any bleeding problems on DOAC.  He will continue on Xarelto 20mg  daily and Toprol XL 25mg  daily.  His Creatinine was 1.19 and Hbg 13.7  in Jan 2020. I will repeat a BMET.    3.  Hypertension - Bp is controlled.  He will continue on Toprol XL 25mg  daily and Doxazosin 4mg  daily.    4.  Chronic diastolic CHF - he has not had any LE edema.  He mows the yard without any SOB.  He only gets SOB when bringing the trash cans back to the house.  Weight is stable.  Continue on Lasix 40mg  daily.    COVID-19 Education: The signs  and symptoms of COVID-19 were discussed with the patient and how to seek care for testing (follow up with PCP or arrange E-visit).  The importance of social distancing was discussed today.  Patient Risk:   After full review of this patient's clinical status, I feel that they are at least moderate risk at this time.  Time:   Today, I have spent 20 minutes directly with the patient on telemedicine discussing medical problems including afib, HTN, OSA, CHF.  We also reviewed the symptoms of COVID 19 and the ways to protect against contracting the virus with telehealth technology.  I spent an additional 5 minutes reviewing patient's chart including PAP compliance download.  Medication Adjustments/Labs and Tests Ordered: Current medicines are reviewed at length with the patient today.  Concerns regarding medicines are outlined above.  Tests Ordered: No orders of the defined types were placed in this encounter.  Medication Changes: No orders of the defined types were placed in this encounter.   Disposition:  Follow up in 6 month(s) with PA and 1 year with me  Signed, Fransico Him, MD  05/27/2019 10:18 AM    [place]

## 2019-05-27 NOTE — Patient Instructions (Signed)
Medication Instructions:  The current medical regimen is effective;  continue present plan and medications.  If you need a refill on your cardiac medications before your next appointment, please call your pharmacy.   Lab work: Please come into the office to have blood work (BMP) If you have labs (blood work) drawn today and your tests are completely normal, you will receive your results only by: Marland Kitchen MyChart Message (if you have MyChart) OR . A paper copy in the mail If you have any lab test that is abnormal or we need to change your treatment, we will call you to review the results.  Follow-Up: Follow up in 1 year with Dr. Radford Pax.  You will receive a letter in the mail 2 months before you are due.  Please call us when you receive this letter to schedule your follow up appointment.  Thank you for choosing [facility]!!

## 2019-05-28 ENCOUNTER — Other Ambulatory Visit: Payer: Medicare Other | Admitting: *Deleted

## 2019-05-28 ENCOUNTER — Other Ambulatory Visit: Payer: Self-pay

## 2019-05-28 DIAGNOSIS — I10 Essential (primary) hypertension: Secondary | ICD-10-CM

## 2019-05-28 DIAGNOSIS — I5032 Chronic diastolic (congestive) heart failure: Secondary | ICD-10-CM

## 2019-05-28 DIAGNOSIS — Z79899 Other long term (current) drug therapy: Secondary | ICD-10-CM

## 2019-05-29 ENCOUNTER — Telehealth: Payer: Self-pay

## 2019-05-29 DIAGNOSIS — R7989 Other specified abnormal findings of blood chemistry: Secondary | ICD-10-CM

## 2019-05-29 LAB — BASIC METABOLIC PANEL
BUN/Creatinine Ratio: 18 (ref 10–24)
BUN: 24 mg/dL (ref 8–27)
CO2: 25 mmol/L (ref 20–29)
Calcium: 9.7 mg/dL (ref 8.6–10.2)
Chloride: 99 mmol/L (ref 96–106)
Creatinine, Ser: 1.33 mg/dL — ABNORMAL HIGH (ref 0.76–1.27)
GFR calc Af Amer: 57 mL/min/{1.73_m2} — ABNORMAL LOW (ref >59)
GFR calc non Af Amer: 49 mL/min/{1.73_m2} — ABNORMAL LOW (ref >59)
Glucose: 67 mg/dL (ref 65–99)
Potassium: 3.7 mmol/L (ref 3.5–5.2)
Sodium: 141 mmol/L (ref 134–144)

## 2019-05-29 MED ORDER — FUROSEMIDE 40 MG PO TABS: 20.0000 mg | ORAL_TABLET | Freq: Every day | ORAL | 3 refills | Status: DC

## 2019-05-29 NOTE — Telephone Encounter (Signed)
Notes recorded by Frederik Schmidt, RN on 05/29/2019 at 12:29 PM EDT  The patient has been notified of the result and verbalized understanding. All questions (if any) were answered.  Frederik Schmidt, RN 05/29/2019 12:29 PM

## 2019-05-29 NOTE — Telephone Encounter (Signed)
-----   Message from Sueanne Margarita, MD sent at 05/29/2019  8:32 AM EDT ----- Creatinine has bumped - please have him decrease Lasix to 20mg  daily and make sure he is not taking NSAIDS including Meloxicam.  Repeat BMET in 1 week

## 2019-06-05 ENCOUNTER — Other Ambulatory Visit: Payer: Medicare Other | Admitting: *Deleted

## 2019-06-05 ENCOUNTER — Other Ambulatory Visit: Payer: Self-pay

## 2019-06-05 DIAGNOSIS — R7989 Other specified abnormal findings of blood chemistry: Secondary | ICD-10-CM | POA: Diagnosis not present

## 2019-06-06 LAB — BASIC METABOLIC PANEL
BUN/Creatinine Ratio: 16 (ref 10–24)
BUN: 19 mg/dL (ref 8–27)
CO2: 22 mmol/L (ref 20–29)
Calcium: 9 mg/dL (ref 8.6–10.2)
Chloride: 103 mmol/L (ref 96–106)
Creatinine, Ser: 1.2 mg/dL (ref 0.76–1.27)
GFR calc Af Amer: 65 mL/min/{1.73_m2} (ref >59)
GFR calc non Af Amer: 56 mL/min/{1.73_m2} — ABNORMAL LOW (ref >59)
Glucose: 143 mg/dL — ABNORMAL HIGH (ref 65–99)
Potassium: 3.8 mmol/L (ref 3.5–5.2)
Sodium: 141 mmol/L (ref 134–144)

## 2019-06-10 NOTE — Telephone Encounter (Signed)
No message needed °

## 2019-06-13 ENCOUNTER — Other Ambulatory Visit: Payer: Self-pay | Admitting: Cardiology

## 2019-06-13 NOTE — Telephone Encounter (Signed)
Pt last saw Dr Radford Pax 05/27/19, last labs 06/05/19 Creat 1.20, age 85, weight 90kg, CrCl 60.42, based on CrCl pt is on appropriate dosage of Xarelto 20mg  QD.  Will refill rx.

## 2019-06-18 DIAGNOSIS — E538 Deficiency of other specified B group vitamins: Secondary | ICD-10-CM | POA: Diagnosis not present

## 2019-06-18 DIAGNOSIS — Z23 Encounter for immunization: Secondary | ICD-10-CM | POA: Diagnosis not present

## 2019-07-23 DIAGNOSIS — E1151 Type 2 diabetes mellitus with diabetic peripheral angiopathy without gangrene: Secondary | ICD-10-CM | POA: Diagnosis not present

## 2019-07-23 DIAGNOSIS — I739 Peripheral vascular disease, unspecified: Secondary | ICD-10-CM | POA: Diagnosis not present

## 2019-07-23 DIAGNOSIS — L603 Nail dystrophy: Secondary | ICD-10-CM | POA: Diagnosis not present

## 2019-07-23 DIAGNOSIS — L84 Corns and callosities: Secondary | ICD-10-CM | POA: Diagnosis not present

## 2019-07-30 DIAGNOSIS — E538 Deficiency of other specified B group vitamins: Secondary | ICD-10-CM | POA: Diagnosis not present

## 2019-10-21 DIAGNOSIS — D510 Vitamin B12 deficiency anemia due to intrinsic factor deficiency: Secondary | ICD-10-CM | POA: Diagnosis not present

## 2019-10-22 ENCOUNTER — Ambulatory Visit: Payer: Medicare Other | Attending: Internal Medicine

## 2019-10-22 DIAGNOSIS — Z23 Encounter for immunization: Secondary | ICD-10-CM | POA: Diagnosis not present

## 2019-10-22 DIAGNOSIS — L97529 Non-pressure chronic ulcer of other part of left foot with unspecified severity: Secondary | ICD-10-CM | POA: Diagnosis not present

## 2019-10-22 DIAGNOSIS — B9689 Other specified bacterial agents as the cause of diseases classified elsewhere: Secondary | ICD-10-CM | POA: Diagnosis not present

## 2019-10-22 DIAGNOSIS — Z1611 Resistance to penicillins: Secondary | ICD-10-CM | POA: Diagnosis not present

## 2019-10-22 NOTE — Progress Notes (Signed)
   Covid-19 Vaccination Clinic  Name:  Cameron [name]    MRN: [id number] DOB: 1934/08/31  10/22/2019  [name] was observed post Covid-19 immunization for 15 minutes without incidence. He was provided with Vaccine Information Sheet and instruction to access the V-Safe system.   [name] was instructed to call 911 with any severe reactions post vaccine: Marland Kitchen Difficulty breathing  . Swelling of your face and throat  . A fast heartbeat  . A bad rash all over your body  . Dizziness and weakness    Immunizations Administered    Name Date Dose VIS Date Route   Pfizer COVID-19 Vaccine 10/22/2019  9:04 AM 0.3 mL 09/12/2019 Intramuscular   Manufacturer: [employer], [employer]   Lot: [id number]   [place]: [id number]

## 2019-10-29 DIAGNOSIS — L97522 Non-pressure chronic ulcer of other part of left foot with fat layer exposed: Secondary | ICD-10-CM | POA: Diagnosis not present

## 2019-10-29 DIAGNOSIS — M2042 Other hammer toe(s) (acquired), left foot: Secondary | ICD-10-CM | POA: Diagnosis not present

## 2019-11-05 ENCOUNTER — Telehealth: Payer: Self-pay | Admitting: *Deleted

## 2019-11-05 NOTE — Telephone Encounter (Signed)

## 2019-11-10 ENCOUNTER — Ambulatory Visit: Payer: Medicare Other | Attending: Internal Medicine

## 2019-11-10 DIAGNOSIS — Z23 Encounter for immunization: Secondary | ICD-10-CM | POA: Insufficient documentation

## 2019-11-10 NOTE — Progress Notes (Signed)
   Covid-19 Vaccination Clinic  Name:  Cameron [name]    MRN: [id number] DOB: 01-05-1934  11/10/2019  Mr. [place]field was observed post Covid-19 immunization for 15 minutes without incidence. He was provided with Vaccine Information Sheet and instruction to access the V-Safe system.   Mr. Samberg was instructed to call 911 with any severe reactions post vaccine: Marland Kitchen Difficulty breathing  . Swelling of your face and throat  . A fast heartbeat  . A bad rash all over your body  . Dizziness and weakness    Immunizations Administered    Name Date Dose VIS Date Route   Pfizer COVID-19 Vaccine 11/10/2019  8:53 AM 0.3 mL 09/12/2019 Intramuscular   Manufacturer: [place]   Lot: [id number]   [place]: [id number]

## 2019-11-12 DIAGNOSIS — L97522 Non-pressure chronic ulcer of other part of left foot with fat layer exposed: Secondary | ICD-10-CM | POA: Diagnosis not present

## 2019-11-14 ENCOUNTER — Telehealth: Payer: Medicare Other | Admitting: Cardiology

## 2019-11-16 NOTE — Progress Notes (Addendum)
Cardiology Office Note:    Date:  11/17/2019   ID:  Cameron [name], DOB July 09, 1934, MRN [id number]  PCP:  Gaynelle Arabian, MD  Cardiologist:  Fransico Him, MD    Referring MD: Gaynelle Arabian, MD   Chief Complaint  Patient presents with  . Sleep Apnea  . Hypertension  . Atrial Fibrillation  . Congestive Heart Failure    History of Present Illness:    Cameron [name] is a 86 y.o. male with a hx of permanentatrial fibrillation,chronic diastolic CHF,asymptomatic bradycardia, systemic anticoagulation and OSAon CPAP. He has moderate OSA with an AHI of 17.53/hr and is on CPAP at 7cm H2O.  He is here today for followup and is doing well.  He denies any chest pain or pressure, SOB, DOE, PND, orthopnea, LE edema, dizziness, palpitations or syncope. He is compliant with his meds and is tolerating meds with no SE.    He is doing well with his CPAP device and thinks that he has gotten used to it.  He tolerates the mask and feels the pressure is adequate.  Since going on CPAP he feels rested in the am and has no significant daytime sleepiness.  He denies any significant mouth or nasal dryness or nasal congestion.  He does not think that he snores.     Past Medical History:  Diagnosis Date  . Acute cholecystitis 09/02/2014  . Anemia    hx of  . Arthritis   . Borderline diabetes    borderline  . Chronic anticoagulation   . Chronic diastolic (congestive) heart failure ([place])   . CKD (chronic kidney disease), stage II   . GERD (gastroesophageal reflux disease)   . Gout    in 70's, "not in toe but everywhere else"  . Hypertension   . IFG (impaired fasting glucose) 09/05/2014  . Low back pain    when lying flat  . Neuromuscular disorder ([place])   . OSA (obstructive sleep apnea)    moderate with AHI 17.53/hr now on CPAP at 7cm H2O  . Peripheral neuropathy    both feet, can feel pain  . Permanent atrial fibrillation ([place])   . Rosacea   . Swelling    both legs sometimes  . Vitamin B12  deficiency     Past Surgical History:  Procedure Laterality Date  . APPENDECTOMY     1997  . CARDIOVERSION     at least 5 times  . CARDIOVERSION N/A 10/24/2013   Procedure: CARDIOVERSION;  Surgeon: Sueanne Margarita, MD;  Location: [place] [facility];  Service: Cardiovascular;  Laterality: N/A;  . CATARACT EXTRACTION Bilateral   . CHOLECYSTECTOMY N/A 11/16/2014   Procedure: ATTEMPTED LAPAROSCOPIC CHOLECYSTECTOMY TO OPEN CHOLECYSTECTOMY WITH LYSIS OF ADHESIONS - 30MINUTES;  Surgeon: Armandina Gemma, MD;  Location: [facility];  Service: General;  Laterality: N/A;  . COLON SURGERY    . EYE SURGERY    . HEMICOLECTOMY  03/1996   with 3 lymph nodes  . HERNIA REPAIR    . INGUINAL HERNIA REPAIR Left 08/21/2013   Procedure:  LEFT INGUINAL HERNIA REPAIR WITH MESH;  Surgeon: Earnstine Regal, MD;  [facility];  Service: General;  Laterality: Left;  . INGUINAL HERNIA REPAIR Right 01/29/2014   Procedure:  REPAIR RIGHT INGUNIAL HERNIA WITH MESH;  Surgeon: Earnstine Regal, MD;  [facility];  Service: General;  Laterality: Right;  . INSERTION OF MESH Left 08/21/2013   Procedure: INSERTION OF MESH;  Surgeon: Earnstine Regal, MD;  [facility];  Service: General;  Laterality: Left;  . TUMOR REMOVAL Right 1961   back of leg    Current Medications: Current Meds  Medication Sig  . bimatoprost (LUMIGAN) 0.01 % SOLN Place 1 drop into both eyes at bedtime.   . cyanocobalamin (,VITAMIN B-12,) 1000 MCG/ML injection Inject 1,000 mcg into the muscle every 6 (six) weeks.   . doxazosin (CARDURA) 4 MG tablet TAKE 1 TABLET(4 MG) BY MOUTH DAILY  . folic acid (FOLVITE) 1 MG tablet Take 1 mg by mouth daily.   . furosemide (LASIX) 40 MG tablet Take 0.5 tablets (20 mg total) by mouth daily.  . meloxicam (MOBIC) 15 MG tablet Take 15 mg by mouth daily as needed for pain.   . metoprolol succinate (TOPROL-XL) 25 MG 24 hr tablet TAKE 1 TABLET(25 MG) BY MOUTH DAILY  . rivaroxaban (XARELTO) 20 MG TABS tablet Take 20 mg by mouth daily  with supper.  . traMADol (ULTRAM) 50 MG tablet Take 50 mg by mouth every 4 (four) hours as needed for moderate pain or severe pain. Maximum dose= 8 tablets per day     Allergies:   Ace inhibitors and Clonidine derivatives   Social History   Socioeconomic History  . Marital status: Widowed    Spouse name: Not on file  . Number of children: Not on file  . Years of education: Not on file  . Highest education level: Not on file  Occupational History  . Occupation: Retired   Tobacco Use  . Smoking status: Former Smoker    Packs/day: 2.00    Years: 20.00    Pack years: 40.00    Types: Cigarettes    Quit date: 10/02/1974    Years since quitting: 45.1  . Smokeless tobacco: Never Used  Substance and Sexual Activity  . Alcohol use: Yes    Alcohol/week: 0.0 standard drinks    Comment: occasionally  . Drug use: No  . Sexual activity: Not on file  Other Topics Concern  . Not on file  Social History Narrative   Pt lives in [place] with spouse.   Retired in 1999 from the [employer].  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         Social Determinants of Health   Financial Resource Strain:   . Difficulty of Paying Living Expenses: Not on file  Food Insecurity:   . Worried About Charity fundraiser in the Last Year: Not on file  . Ran Out of Food in the Last Year: Not on file  Transportation Needs:   . Lack of Transportation (Medical): Not on file  . Lack of Transportation (Non-Medical): Not on file  Physical Activity:   . Days of Exercise per Week: Not on file  . Minutes of Exercise per Session: Not on file  Stress:   . Feeling of Stress : Not on file  Social Connections:   . Frequency of Communication with Friends and Family: Not on file  . Frequency of Social Gatherings with Friends and Family: Not on file  . Attends Religious Services: Not on file  . Active Member of Clubs or Organizations: Not on file  . Attends English as a second language teacher Meetings: Not on file  . Marital Status: Not on file     Family History: The patient's family history includes Allergies in his daughter; Asthma in his daughter; Cancer in his father; Dementia in his sister; Stroke (age of onset: 20) in his mother.  ROS:   Please see the history of present illness.    ROS  All other systems reviewed and negative.   EKGs/Labs/Other Studies Reviewed:    The following studies were reviewed today: PAP compliance download from [place], outside labs from [facility]  EKG:  EKG is not ordered today.    Recent Labs: 06/05/2019: BUN 19; Creatinine, Ser 1.20; Potassium 3.8; Sodium 141   Recent Lipid Panel No results found for: CHOL, TRIG, HDL, CHOLHDL, VLDL, LDLCALC, LDLDIRECT  Physical Exam:    VS:  BP (!) 142/86   Pulse 86   Ht 5\' 8"  (1.727 m)   Wt 201 lb 12.8 oz (91.5 kg)   SpO2 98%   BMI 30.68 kg/m     Wt Readings from Last 3 Encounters:  11/17/19 201 lb 12.8 oz (91.5 kg)  11/08/18 198 lb 6.4 oz (90 kg)  04/23/18 197 lb (89.4 kg)     GEN:  Well nourished, well developed in no acute distress HEENT: Normal NECK: No JVD; No carotid bruits LYMPHATICS: No lymphadenopathy CARDIAC: irregularly irregular, no murmurs, rubs, gallops RESPIRATORY:  Clear to auscultation without rales, wheezing or rhonchi  ABDOMEN: Soft, non-tender, non-distended MUSCULOSKELETAL:  No edema; No deformity  SKIN: Warm and dry NEUROLOGIC:  Alert and oriented x 3 PSYCHIATRIC:  Normal affect   ASSESSMENT:    1. OSA (obstructive sleep apnea)   2. Permanent atrial fibrillation ([place])   3. Essential hypertension   4. Chronic diastolic heart failure (HCC)    PLAN:    In order of problems listed above:  1.  OSA -  The patient is tolerating PAP therapy well without any problems. The PAP download was reviewed today and showed an AHI of 3.5/hr on 7 cm H2O with 83% compliance in using more than 4 hours nightly.  The patient has been using and benefiting from PAP  use and will continue to benefit from therapy.   2.  Permanent atrial fibrillation -HR well controled -continue Xarelto 20mg  daily and Toprol XL 25mg  daily -creatinine was 1.2 in Sept 2020 and Hbg 13.7 in Jan 2020 -repeat BMEt and CBC  3.  HTN -BP controlled -continue Toprol XL 25mg  daily and Doxazosin 4mg  daily  4.  Chronic diastolic CHF -he appears euvolemic on exam -denies any SOB or LE edema -continue Lasix 40mg  daily   Medication Adjustments/Labs and Tests Ordered: Current medicines are reviewed at length with the patient today.  Concerns regarding medicines are outlined above.  No orders of the defined types were placed in this encounter.  No orders of the defined types were placed in this encounter.   Signed, Fransico Him, MD  11/17/2019 9:40 AM    [place]

## 2019-11-17 ENCOUNTER — Ambulatory Visit (INDEPENDENT_AMBULATORY_CARE_PROVIDER_SITE_OTHER): Payer: Medicare Other | Admitting: Cardiology

## 2019-11-17 ENCOUNTER — Encounter: Payer: Self-pay | Admitting: Cardiology

## 2019-11-17 ENCOUNTER — Other Ambulatory Visit: Payer: Self-pay

## 2019-11-17 ENCOUNTER — Ambulatory Visit: Payer: Medicare Other | Admitting: Physician Assistant

## 2019-11-17 ENCOUNTER — Ambulatory Visit: Payer: Medicare Other | Admitting: Cardiology

## 2019-11-17 VITALS — BP 142/86 | HR 86 | Ht 68.0 in | Wt 201.8 lb

## 2019-11-17 DIAGNOSIS — I4821 Permanent atrial fibrillation: Secondary | ICD-10-CM | POA: Diagnosis not present

## 2019-11-17 DIAGNOSIS — I5032 Chronic diastolic (congestive) heart failure: Secondary | ICD-10-CM | POA: Diagnosis not present

## 2019-11-17 DIAGNOSIS — I10 Essential (primary) hypertension: Secondary | ICD-10-CM | POA: Diagnosis not present

## 2019-11-17 DIAGNOSIS — G4733 Obstructive sleep apnea (adult) (pediatric): Secondary | ICD-10-CM | POA: Diagnosis not present

## 2019-11-17 LAB — CBC
Hematocrit: 38.6 % (ref 37.5–51.0)
Hemoglobin: 12.9 g/dL — ABNORMAL LOW (ref 13.0–17.7)
MCH: 28.1 pg (ref 26.6–33.0)
MCHC: 33.4 g/dL (ref 31.5–35.7)
MCV: 84 fL (ref 79–97)
Platelets: 132 10*3/uL — ABNORMAL LOW (ref 150–450)
RBC: 4.59 x10E6/uL (ref 4.14–5.80)
RDW: 14.6 % (ref 11.6–15.4)
WBC: 5.7 10*3/uL (ref 3.4–10.8)

## 2019-11-17 LAB — BASIC METABOLIC PANEL
BUN/Creatinine Ratio: 15 (ref 10–24)
BUN: 19 mg/dL (ref 8–27)
CO2: 24 mmol/L (ref 20–29)
Calcium: 9.2 mg/dL (ref 8.6–10.2)
Chloride: 102 mmol/L (ref 96–106)
Creatinine, Ser: 1.3 mg/dL — ABNORMAL HIGH (ref 0.76–1.27)
GFR calc Af Amer: 58 mL/min/{1.73_m2} — ABNORMAL LOW (ref >59)
GFR calc non Af Amer: 50 mL/min/{1.73_m2} — ABNORMAL LOW (ref >59)
Glucose: 106 mg/dL — ABNORMAL HIGH (ref 65–99)
Potassium: 4.3 mmol/L (ref 3.5–5.2)
Sodium: 138 mmol/L (ref 134–144)

## 2019-11-17 NOTE — Patient Instructions (Signed)
Medication Instructions:  Your physician recommends that you continue on your current medications as directed. Please refer to the Current Medication list given to you today.  *If you need a refill on your cardiac medications before your next appointment, please call your pharmacy*  Lab Work: TODAY: CBC, BMET  If you have labs (blood work) drawn today and your tests are completely normal, you will receive your results only by: Marland Kitchen MyChart Message (if you have MyChart) OR . A paper copy in the mail If you have any lab test that is abnormal or we need to change your treatment, we will call you to review the results.  Testing/Procedures: None ordered  Follow-Up: At [facility], you and your health needs are our priority.  As part of our continuing mission to provide you with exceptional heart care, we have created designated Provider Care Teams.  These Care Teams include your primary Cardiologist (physician) and Advanced Practice Providers (APPs -  Physician Assistants and Nurse Practitioners) who all work together to provide you with the care you need, when you need it.  Your next appointment:   12 month(s)  The format for your next appointment:   In Person  Provider:   Fransico Him, MD  Other Instructions

## 2019-11-17 NOTE — Addendum Note (Signed)
Addended by: Drue Novel I on: 11/17/2019 09:55 AM   Modules accepted: Orders

## 2019-11-18 ENCOUNTER — Telehealth: Payer: Self-pay

## 2019-11-18 DIAGNOSIS — R7989 Other specified abnormal findings of blood chemistry: Secondary | ICD-10-CM

## 2019-11-18 MED ORDER — FUROSEMIDE 20 MG PO TABS: 20.0000 mg | ORAL_TABLET | ORAL | 3 refills | Status: DC

## 2019-11-18 NOTE — Telephone Encounter (Signed)
The patient has been notified of the result and verbalized understanding.  All questions (if any) were answered. Antonieta Iba, RN 11/18/2019 4:04 PM    Told patient I would call him back to confirm how much Lasix he is taking as he states he is already taking 20mg  qd. He asked me to leave a message if I call back because he may be out.  Called back and left a message that he should start taking Lasix 20 mg every other day and that he will need to come back in one week for repeat BMET.

## 2019-11-18 NOTE — Telephone Encounter (Signed)
-----   Message from Sueanne Margarita, MD sent at 11/18/2019  3:08 PM EST ----- Go to lasix 20mg  qod and avoid NSAIDs and repeat in 1 week

## 2019-11-25 ENCOUNTER — Other Ambulatory Visit: Payer: Medicare Other

## 2019-11-25 ENCOUNTER — Other Ambulatory Visit: Payer: Self-pay

## 2019-11-25 DIAGNOSIS — R7989 Other specified abnormal findings of blood chemistry: Secondary | ICD-10-CM | POA: Diagnosis not present

## 2019-11-26 DIAGNOSIS — E538 Deficiency of other specified B group vitamins: Secondary | ICD-10-CM | POA: Diagnosis not present

## 2019-11-26 DIAGNOSIS — H409 Unspecified glaucoma: Secondary | ICD-10-CM | POA: Diagnosis not present

## 2019-11-26 DIAGNOSIS — D692 Other nonthrombocytopenic purpura: Secondary | ICD-10-CM | POA: Diagnosis not present

## 2019-11-26 DIAGNOSIS — E1169 Type 2 diabetes mellitus with other specified complication: Secondary | ICD-10-CM | POA: Diagnosis not present

## 2019-11-26 DIAGNOSIS — D510 Vitamin B12 deficiency anemia due to intrinsic factor deficiency: Secondary | ICD-10-CM | POA: Diagnosis not present

## 2019-11-26 DIAGNOSIS — E1149 Type 2 diabetes mellitus with other diabetic neurological complication: Secondary | ICD-10-CM | POA: Diagnosis not present

## 2019-11-26 DIAGNOSIS — E781 Pure hyperglyceridemia: Secondary | ICD-10-CM | POA: Diagnosis not present

## 2019-11-26 DIAGNOSIS — L97522 Non-pressure chronic ulcer of other part of left foot with fat layer exposed: Secondary | ICD-10-CM | POA: Diagnosis not present

## 2019-11-26 DIAGNOSIS — G629 Polyneuropathy, unspecified: Secondary | ICD-10-CM | POA: Diagnosis not present

## 2019-11-26 DIAGNOSIS — Z1389 Encounter for screening for other disorder: Secondary | ICD-10-CM | POA: Diagnosis not present

## 2019-11-26 DIAGNOSIS — Z0000 Encounter for general adult medical examination without abnormal findings: Secondary | ICD-10-CM | POA: Diagnosis not present

## 2019-11-26 DIAGNOSIS — I10 Essential (primary) hypertension: Secondary | ICD-10-CM | POA: Diagnosis not present

## 2019-11-26 DIAGNOSIS — I4891 Unspecified atrial fibrillation: Secondary | ICD-10-CM | POA: Diagnosis not present

## 2019-11-26 LAB — BASIC METABOLIC PANEL
BUN/Creatinine Ratio: 15 (ref 10–24)
BUN: 17 mg/dL (ref 8–27)
CO2: 20 mmol/L (ref 20–29)
Calcium: 9 mg/dL (ref 8.6–10.2)
Chloride: 106 mmol/L (ref 96–106)
Creatinine, Ser: 1.11 mg/dL (ref 0.76–1.27)
GFR calc Af Amer: 71 mL/min/{1.73_m2} (ref >59)
GFR calc non Af Amer: 61 mL/min/{1.73_m2} (ref >59)
Glucose: 127 mg/dL — ABNORMAL HIGH (ref 65–99)
Potassium: 4 mmol/L (ref 3.5–5.2)
Sodium: 143 mmol/L (ref 134–144)

## 2019-12-01 ENCOUNTER — Other Ambulatory Visit: Payer: Self-pay | Admitting: Family Medicine

## 2019-12-01 DIAGNOSIS — D485 Neoplasm of uncertain behavior of skin: Secondary | ICD-10-CM | POA: Diagnosis not present

## 2019-12-01 DIAGNOSIS — C44319 Basal cell carcinoma of skin of other parts of face: Secondary | ICD-10-CM | POA: Diagnosis not present

## 2019-12-08 DIAGNOSIS — L97522 Non-pressure chronic ulcer of other part of left foot with fat layer exposed: Secondary | ICD-10-CM | POA: Diagnosis not present

## 2019-12-22 DIAGNOSIS — M24575 Contracture, left foot: Secondary | ICD-10-CM | POA: Diagnosis not present

## 2020-01-06 DIAGNOSIS — C44319 Basal cell carcinoma of skin of other parts of face: Secondary | ICD-10-CM | POA: Diagnosis not present

## 2020-01-07 DIAGNOSIS — E538 Deficiency of other specified B group vitamins: Secondary | ICD-10-CM | POA: Diagnosis not present

## 2020-01-08 DIAGNOSIS — M12272 Villonodular synovitis (pigmented), left ankle and foot: Secondary | ICD-10-CM | POA: Diagnosis not present

## 2020-01-08 DIAGNOSIS — L600 Ingrowing nail: Secondary | ICD-10-CM | POA: Diagnosis not present

## 2020-01-29 DIAGNOSIS — L603 Nail dystrophy: Secondary | ICD-10-CM | POA: Diagnosis not present

## 2020-01-29 DIAGNOSIS — E1151 Type 2 diabetes mellitus with diabetic peripheral angiopathy without gangrene: Secondary | ICD-10-CM | POA: Diagnosis not present

## 2020-01-29 DIAGNOSIS — I739 Peripheral vascular disease, unspecified: Secondary | ICD-10-CM | POA: Diagnosis not present

## 2020-01-29 DIAGNOSIS — L84 Corns and callosities: Secondary | ICD-10-CM | POA: Diagnosis not present

## 2020-02-18 DIAGNOSIS — E538 Deficiency of other specified B group vitamins: Secondary | ICD-10-CM | POA: Diagnosis not present

## 2020-03-19 ENCOUNTER — Other Ambulatory Visit: Payer: Self-pay | Admitting: Cardiology

## 2020-03-19 NOTE — Telephone Encounter (Signed)
Pt last saw Dr Radford Pax 11/17/19, last labs Creat 1.11, age 86, weight 91.5kg, CrCl 65.26, based on CrCl pt is on appropriate dosage of Xarelto 20mg  QD.  Will refill rx.

## 2020-04-29 DIAGNOSIS — E1151 Type 2 diabetes mellitus with diabetic peripheral angiopathy without gangrene: Secondary | ICD-10-CM | POA: Diagnosis not present

## 2020-04-29 DIAGNOSIS — L97522 Non-pressure chronic ulcer of other part of left foot with fat layer exposed: Secondary | ICD-10-CM | POA: Diagnosis not present

## 2020-04-29 DIAGNOSIS — I739 Peripheral vascular disease, unspecified: Secondary | ICD-10-CM | POA: Diagnosis not present

## 2020-04-29 DIAGNOSIS — L603 Nail dystrophy: Secondary | ICD-10-CM | POA: Diagnosis not present

## 2020-04-29 DIAGNOSIS — L84 Corns and callosities: Secondary | ICD-10-CM | POA: Diagnosis not present

## 2020-05-12 DIAGNOSIS — D510 Vitamin B12 deficiency anemia due to intrinsic factor deficiency: Secondary | ICD-10-CM | POA: Diagnosis not present

## 2020-05-13 DIAGNOSIS — L97521 Non-pressure chronic ulcer of other part of left foot limited to breakdown of skin: Secondary | ICD-10-CM | POA: Diagnosis not present

## 2020-05-25 DIAGNOSIS — H5310 Unspecified subjective visual disturbances: Secondary | ICD-10-CM | POA: Diagnosis not present

## 2020-05-25 DIAGNOSIS — H43813 Vitreous degeneration, bilateral: Secondary | ICD-10-CM | POA: Diagnosis not present

## 2020-06-09 ENCOUNTER — Other Ambulatory Visit: Payer: Self-pay | Admitting: Cardiology

## 2020-06-23 DIAGNOSIS — E538 Deficiency of other specified B group vitamins: Secondary | ICD-10-CM | POA: Diagnosis not present

## 2020-06-23 DIAGNOSIS — Z23 Encounter for immunization: Secondary | ICD-10-CM | POA: Diagnosis not present

## 2020-07-21 DIAGNOSIS — I739 Peripheral vascular disease, unspecified: Secondary | ICD-10-CM | POA: Diagnosis not present

## 2020-07-21 DIAGNOSIS — L603 Nail dystrophy: Secondary | ICD-10-CM | POA: Diagnosis not present

## 2020-07-21 DIAGNOSIS — L97521 Non-pressure chronic ulcer of other part of left foot limited to breakdown of skin: Secondary | ICD-10-CM | POA: Diagnosis not present

## 2020-07-21 DIAGNOSIS — L97522 Non-pressure chronic ulcer of other part of left foot with fat layer exposed: Secondary | ICD-10-CM | POA: Diagnosis not present

## 2020-07-21 DIAGNOSIS — L84 Corns and callosities: Secondary | ICD-10-CM | POA: Diagnosis not present

## 2020-08-04 DIAGNOSIS — E538 Deficiency of other specified B group vitamins: Secondary | ICD-10-CM | POA: Diagnosis not present

## 2020-08-05 DIAGNOSIS — L97522 Non-pressure chronic ulcer of other part of left foot with fat layer exposed: Secondary | ICD-10-CM | POA: Diagnosis not present

## 2020-08-10 DIAGNOSIS — H26492 Other secondary cataract, left eye: Secondary | ICD-10-CM | POA: Diagnosis not present

## 2020-08-10 DIAGNOSIS — H524 Presbyopia: Secondary | ICD-10-CM | POA: Diagnosis not present

## 2020-08-10 DIAGNOSIS — H401112 Primary open-angle glaucoma, right eye, moderate stage: Secondary | ICD-10-CM | POA: Diagnosis not present

## 2020-08-10 DIAGNOSIS — E119 Type 2 diabetes mellitus without complications: Secondary | ICD-10-CM | POA: Diagnosis not present

## 2020-08-19 DIAGNOSIS — L97522 Non-pressure chronic ulcer of other part of left foot with fat layer exposed: Secondary | ICD-10-CM | POA: Diagnosis not present

## 2020-09-03 DIAGNOSIS — L97521 Non-pressure chronic ulcer of other part of left foot limited to breakdown of skin: Secondary | ICD-10-CM | POA: Diagnosis not present

## 2020-09-06 ENCOUNTER — Other Ambulatory Visit: Payer: Self-pay | Admitting: Cardiology

## 2020-09-07 NOTE — Telephone Encounter (Signed)
Pt last saw Dr Radford Pax 11/17/19, last labs 11/25/19 Creat 1.11, age 86, weight 91.5kg, CrCl 64.11, based on CrCl pt is on appropriate dosage of Xarelto 20mg  QD.  Will refill rx.

## 2020-09-15 DIAGNOSIS — E538 Deficiency of other specified B group vitamins: Secondary | ICD-10-CM | POA: Diagnosis not present

## 2020-10-07 DIAGNOSIS — H26492 Other secondary cataract, left eye: Secondary | ICD-10-CM | POA: Diagnosis not present

## 2020-10-27 DIAGNOSIS — E538 Deficiency of other specified B group vitamins: Secondary | ICD-10-CM | POA: Diagnosis not present

## 2020-11-03 DIAGNOSIS — I739 Peripheral vascular disease, unspecified: Secondary | ICD-10-CM | POA: Diagnosis not present

## 2020-11-03 DIAGNOSIS — E1151 Type 2 diabetes mellitus with diabetic peripheral angiopathy without gangrene: Secondary | ICD-10-CM | POA: Diagnosis not present

## 2020-11-03 DIAGNOSIS — L84 Corns and callosities: Secondary | ICD-10-CM | POA: Diagnosis not present

## 2020-11-03 DIAGNOSIS — L603 Nail dystrophy: Secondary | ICD-10-CM | POA: Diagnosis not present

## 2020-11-03 DIAGNOSIS — L97522 Non-pressure chronic ulcer of other part of left foot with fat layer exposed: Secondary | ICD-10-CM | POA: Diagnosis not present

## 2020-11-16 ENCOUNTER — Ambulatory Visit (INDEPENDENT_AMBULATORY_CARE_PROVIDER_SITE_OTHER): Payer: Medicare Other | Admitting: Cardiology

## 2020-11-16 ENCOUNTER — Encounter: Payer: Self-pay | Admitting: Cardiology

## 2020-11-16 ENCOUNTER — Other Ambulatory Visit: Payer: Self-pay

## 2020-11-16 VITALS — BP 140/70 | HR 100 | Ht 68.0 in | Wt 204.0 lb

## 2020-11-16 DIAGNOSIS — I5032 Chronic diastolic (congestive) heart failure: Secondary | ICD-10-CM | POA: Diagnosis not present

## 2020-11-16 DIAGNOSIS — I10 Essential (primary) hypertension: Secondary | ICD-10-CM

## 2020-11-16 DIAGNOSIS — I4821 Permanent atrial fibrillation: Secondary | ICD-10-CM

## 2020-11-16 DIAGNOSIS — G4733 Obstructive sleep apnea (adult) (pediatric): Secondary | ICD-10-CM

## 2020-11-16 LAB — BASIC METABOLIC PANEL
BUN/Creatinine Ratio: 16 (ref 10–24)
BUN: 23 mg/dL (ref 8–27)
CO2: 21 mmol/L (ref 20–29)
Calcium: 9.2 mg/dL (ref 8.6–10.2)
Chloride: 104 mmol/L (ref 96–106)
Creatinine, Ser: 1.4 mg/dL — ABNORMAL HIGH (ref 0.76–1.27)
GFR calc Af Amer: 53 mL/min/{1.73_m2} — ABNORMAL LOW (ref >59)
GFR calc non Af Amer: 46 mL/min/{1.73_m2} — ABNORMAL LOW (ref >59)
Glucose: 153 mg/dL — ABNORMAL HIGH (ref 65–99)
Potassium: 4.2 mmol/L (ref 3.5–5.2)
Sodium: 142 mmol/L (ref 134–144)

## 2020-11-16 LAB — CBC
Hematocrit: 42.2 % (ref 37.5–51.0)
Hemoglobin: 13.6 g/dL (ref 13.0–17.7)
MCH: 28 pg (ref 26.6–33.0)
MCHC: 32.2 g/dL (ref 31.5–35.7)
MCV: 87 fL (ref 79–97)
Platelets: 153 10*3/uL (ref 150–450)
RBC: 4.86 x10E6/uL (ref 4.14–5.80)
RDW: 14.9 % (ref 11.6–15.4)
WBC: 6.4 10*3/uL (ref 3.4–10.8)

## 2020-11-16 MED ORDER — METOPROLOL SUCCINATE ER 25 MG PO TB24: 25.0000 mg | ORAL_TABLET | Freq: Two times a day (BID) | ORAL | 3 refills | Status: DC

## 2020-11-16 NOTE — Patient Instructions (Addendum)
Please check your blood pressure and heart rate twice daily for the next week and call us with a list of your readings.   Medication Instructions:  Your physician has recommended you make the following change in your medication: 1) INCREASE Toprol XL to 25 mg twice daily   *If you need a refill on your cardiac medications before your next appointment, please call your pharmacy*   Lab Work: TODAY: BMET and CBC If you have labs (blood work) drawn today and your tests are completely normal, you will receive your results only by: Marland Kitchen MyChart Message (if you have MyChart) OR . A paper copy in the mail If you have any lab test that is abnormal or we need to change your treatment, we will call you to review the results.  Follow-Up: At [facility], you and your health needs are our priority.  As part of our continuing mission to provide you with exceptional heart care, we have created designated Provider Care Teams.  These Care Teams include your primary Cardiologist (physician) and Advanced Practice Providers (APPs -  Physician Assistants and Nurse Practitioners) who all work together to provide you with the care you need, when you need it.  Your next appointment:   1 year(s)  The format for your next appointment:   In Person  Provider:   You may see Fransico Him, MD or one of the following Advanced Practice Providers on your designated Care Team:    Melina Copa, PA-C  Ermalinda Barrios, PA-C

## 2020-11-16 NOTE — Progress Notes (Signed)
Cardiology Office Note:    Date:  11/16/2020   ID:  Cameron [name], DOB 12-28-1933, MRN [id number]  PCP:  Cameron Arabian, MD  Cardiologist:  Cameron Him, MD    Referring MD: Cameron Arabian, MD   Chief Complaint  Patient presents with  . Atrial Fibrillation  . Congestive Heart Failure  . Sleep Apnea    History of Present Illness:    Cameron [name] is a 87 y.o. male with a hx of permanentatrial fibrillation,chronic diastolic CHF,asymptomatic bradycardia, systemic anticoagulation and OSAon CPAP. He has moderate OSA with an AHI of 17.53/hr and is on CPAP at 7cm H2O.    He is here today for followup and is doing well.  He has chronic LE edema which is stable.  He denies any chest pain or pressure, SOB, DOE, PND, orthopnea,  dizziness, palpitations or syncope. Hee is compliant with her meds and is tolerating meds with no SE.    She is doing well with his CPAP device and thinks that he has gotten used to it.  He tolerates the mask and feels the pressure is adequate.  Since going on CPAP he feels rested in the am and has no significant daytime sleepiness.  He denies any significant mouth or nasal dryness or nasal congestion.  He does not think that he snores.     Past Medical History:  Diagnosis Date  . Acute cholecystitis 09/02/2014  . Anemia    hx of  . Arthritis   . Borderline diabetes    borderline  . Chronic anticoagulation   . Chronic diastolic (congestive) heart failure ([place])   . CKD (chronic kidney disease), stage II   . GERD (gastroesophageal reflux disease)   . Gout    in 70's, "not in toe but everywhere else"  . Hypertension   . IFG (impaired fasting glucose) 09/05/2014  . Low back pain    when lying flat  . Neuromuscular disorder ([place])   . OSA (obstructive sleep apnea)    moderate with AHI 17.53/hr now on CPAP at 7cm H2O  . Peripheral neuropathy    both feet, can feel pain  . Permanent atrial fibrillation ([place])   . Rosacea   . Swelling    both legs  sometimes  . Vitamin B12 deficiency     Past Surgical History:  Procedure Laterality Date  . APPENDECTOMY     1997  . CARDIOVERSION     at least 5 times  . CARDIOVERSION N/A 10/24/2013   Procedure: CARDIOVERSION;  Surgeon: Cameron Margarita, MD;  Location: [place];  Service: Cardiovascular;  Laterality: N/A;  . CATARACT EXTRACTION Bilateral   . CHOLECYSTECTOMY N/A 11/16/2014   Procedure: ATTEMPTED LAPAROSCOPIC CHOLECYSTECTOMY TO OPEN CHOLECYSTECTOMY WITH LYSIS OF ADHESIONS - 30MINUTES;  Surgeon: Cameron Gemma, MD;  Location: [place];  Service: General;  Laterality: N/A;  . COLON SURGERY    . EYE SURGERY    . HEMICOLECTOMY  03/1996   with 3 lymph nodes  . HERNIA REPAIR    . INGUINAL HERNIA REPAIR Left 08/21/2013   Procedure:  LEFT INGUINAL HERNIA REPAIR WITH MESH;  Surgeon: Cameron Regal, MD;  [facility];  Service: General;  Laterality: Left;  . INGUINAL HERNIA REPAIR Right 01/29/2014   Procedure:  REPAIR RIGHT INGUNIAL HERNIA WITH MESH;  Surgeon: Cameron Regal, MD;  [facility];  Service: General;  Laterality: Right;  . INSERTION OF MESH Left 08/21/2013   Procedure: INSERTION OF MESH;  Surgeon: Cameron Mocha  Leeanne Mannan, MD;  [facility];  Service: General;  Laterality: Left;  . TUMOR REMOVAL Right 1961   back of leg    Current Medications: Current Meds  Medication Sig  . bimatoprost (LUMIGAN) 0.01 % SOLN Place 1 drop into both eyes at bedtime.  . cyanocobalamin (,VITAMIN B-12,) 1000 MCG/ML injection Inject 1,000 mcg into the muscle every 6 (six) weeks.  . doxazosin (CARDURA) 4 MG tablet TAKE 1 TABLET(4 MG) BY MOUTH DAILY  . folic acid (FOLVITE) 1 MG tablet Take 1 mg by mouth daily.  . furosemide (LASIX) 20 MG tablet Take 1 tablet (20 mg total) by mouth every other day.  . furosemide (LASIX) 40 MG tablet TAKE 1 TABLET BY MOUTH ONCE A DAY MAY TAKE EXTRA TABLET DAILY AS NEEDED FOR SWELLING  . meloxicam (MOBIC) 15 MG tablet Take 15 mg by mouth daily as needed for pain.   .  metoprolol succinate (TOPROL-XL) 25 MG 24 hr tablet TAKE 1 TABLET(25 MG) BY MOUTH DAILY  . traMADol (ULTRAM) 50 MG tablet Take 50 mg by mouth every 4 (four) hours as needed for moderate pain or severe pain. Maximum dose= 8 tablets per day  . XARELTO 20 MG TABS tablet TAKE 1 TABLET DAILY     Allergies:   Ace inhibitors and Clonidine derivatives   Social History   Socioeconomic History  . Marital status: Widowed    Spouse name: Not on file  . Number of children: Not on file  . Years of education: Not on file  . Highest education level: Not on file  Occupational History  . Occupation: Retired   Tobacco Use  . Smoking status: Former Smoker    Packs/day: 2.00    Years: 20.00    Pack years: 40.00    Types: Cigarettes    Quit date: 10/02/1974    Years since quitting: 46.1  . Smokeless tobacco: Never Used  Vaping Use  . Vaping Use: Never used  Substance and Sexual Activity  . Alcohol use: Yes    Alcohol/week: 0.0 standard drinks    Comment: occasionally  . Drug use: No  . Sexual activity: Not on file  Other Topics Concern  . Not on file  Social History Narrative   Pt lives in [place] with spouse.   Retired in 1999 from the [employer].  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         Social Determinants of Health   Financial Resource Strain: Not on file  Food Insecurity: Not on file  Transportation Needs: Not on file  Physical Activity: Not on file  Stress: Not on file  Social Connections: Not on file     Family History: The patient's family history includes Allergies in his daughter; Asthma in his daughter; Cancer in his father; Dementia in his sister; Stroke (age of onset: 8) in his mother.  ROS:   Please see the history of present illness.    ROS  All other systems reviewed and negative.   EKGs/Labs/Other Studies Reviewed:    The following studies were reviewed today: PAP compliance download from [place],  outside labs from [facility]  EKG:  EKG is ordered today and showed atrial flutter with RVR at 100bpm  Recent Labs: 11/17/2019: Hemoglobin 12.9; Platelets 132 11/25/2019: BUN 17; Creatinine, Ser 1.11; Potassium 4.0; Sodium 143   Recent Lipid Panel No results found for: CHOL, TRIG, HDL, CHOLHDL, VLDL, LDLCALC, LDLDIRECT  Physical Exam:  VS:  BP 140/70   Pulse 100   Ht 5\' 8"  (1.727 m)   Wt 204 lb (92.5 kg)   SpO2 98%   BMI 31.02 kg/m     Wt Readings from Last 3 Encounters:  11/16/20 204 lb (92.5 kg)  11/17/19 201 lb 12.8 oz (91.5 kg)  11/08/18 198 lb 6.4 oz (90 kg)     GEN: Well nourished, well developed in no acute distress HEENT: Normal NECK: No JVD; No carotid bruits LYMPHATICS: No lymphadenopathy CARDIAC:irregular and tachy, no murmurs, rubs, gallops RESPIRATORY:  Clear to auscultation without rales, wheezing or rhonchi  ABDOMEN: Soft, non-tender, non-distended MUSCULOSKELETAL:  No edema; No deformity  SKIN: Warm and dry NEUROLOGIC:  Alert and oriented x 3 PSYCHIATRIC:  Normal affect   ASSESSMENT:    1. OSA (obstructive sleep apnea)   2. Permanent atrial fibrillation ([place])   3. Primary hypertension   4. Chronic diastolic heart failure (HCC)    PLAN:    In order of problems listed above:  1.  OSA - The patient is tolerating PAP therapy well without any problems. The PAP download was reviewed today and showed an AHI of 3.3/hr on 7 cm H2O with 90% compliance in using more than 4 hours nightly.  The patient has been using and benefiting from PAP use and will continue to benefit from therapy.   2.  Permanent atrial fibrillation -HR is borderline controlled on exam today -continue Xarelto 20mg  daily and increase Toprol to 25mg  BID for better HR control ( he takes his toprol now at night) -check BMET and CBC today -he has not had any bleeding problems on DOAC  3.  HTN _BP controlled -continue Toprol XL 2 but increasing to 25mg  BID for better HR and BP control and  continue  Doxazosin 4mg  daily  4.  Chronic diastolic CHF -he appears stable from a volume standpoint -denies any SOB or LE edema -continue Lasix 40mg  qod alt with 60mg  qod -check BMET  Followup with me in 1 year   Medication Adjustments/Labs and Tests Ordered: Current medicines are reviewed at length with the patient today.  Concerns regarding medicines are outlined above.  No orders of the defined types were placed in this encounter.  No orders of the defined types were placed in this encounter.   Signed, Cameron Him, MD  11/16/2020 8:41 AM    [place]

## 2020-11-18 DIAGNOSIS — L97522 Non-pressure chronic ulcer of other part of left foot with fat layer exposed: Secondary | ICD-10-CM | POA: Diagnosis not present

## 2020-11-25 ENCOUNTER — Telehealth: Payer: Self-pay | Admitting: Cardiology

## 2020-11-25 NOTE — Telephone Encounter (Signed)
Follow Up:     Pt was told to call with his blood pressure readings. She wanted morning and evening and pulse readings  11-18-20-   136/72 pulse 65     149/74 pulse 62  11-19-20-     151/77 pulse 65      133/80 pulse 65  11-20-20-       137/76 pulse 64       125/78 pulse 63     11-22-19-       133/76 pulse 68         134/70 pulse 63      11-22-20-         129/75 pulse 65         135/62 pulse 51           11-23-20-           131/69 pulse 71          136/75 pulse 52         11-24-20-            137/64 pulse 54            132/67 pulse 51

## 2020-11-26 NOTE — Telephone Encounter (Signed)
HR and BP look good - continue current meds

## 2020-12-06 DIAGNOSIS — Z0000 Encounter for general adult medical examination without abnormal findings: Secondary | ICD-10-CM | POA: Diagnosis not present

## 2020-12-06 DIAGNOSIS — E538 Deficiency of other specified B group vitamins: Secondary | ICD-10-CM | POA: Diagnosis not present

## 2020-12-06 DIAGNOSIS — E781 Pure hyperglyceridemia: Secondary | ICD-10-CM | POA: Diagnosis not present

## 2020-12-06 DIAGNOSIS — Z1389 Encounter for screening for other disorder: Secondary | ICD-10-CM | POA: Diagnosis not present

## 2020-12-06 DIAGNOSIS — H409 Unspecified glaucoma: Secondary | ICD-10-CM | POA: Diagnosis not present

## 2020-12-06 DIAGNOSIS — D692 Other nonthrombocytopenic purpura: Secondary | ICD-10-CM | POA: Diagnosis not present

## 2020-12-06 DIAGNOSIS — E1149 Type 2 diabetes mellitus with other diabetic neurological complication: Secondary | ICD-10-CM | POA: Diagnosis not present

## 2020-12-06 DIAGNOSIS — I4891 Unspecified atrial fibrillation: Secondary | ICD-10-CM | POA: Diagnosis not present

## 2020-12-06 DIAGNOSIS — E1169 Type 2 diabetes mellitus with other specified complication: Secondary | ICD-10-CM | POA: Diagnosis not present

## 2020-12-06 DIAGNOSIS — I10 Essential (primary) hypertension: Secondary | ICD-10-CM | POA: Diagnosis not present

## 2020-12-06 DIAGNOSIS — G629 Polyneuropathy, unspecified: Secondary | ICD-10-CM | POA: Diagnosis not present

## 2020-12-06 DIAGNOSIS — D510 Vitamin B12 deficiency anemia due to intrinsic factor deficiency: Secondary | ICD-10-CM | POA: Diagnosis not present

## 2020-12-06 DIAGNOSIS — L97522 Non-pressure chronic ulcer of other part of left foot with fat layer exposed: Secondary | ICD-10-CM | POA: Diagnosis not present

## 2020-12-21 DIAGNOSIS — M24575 Contracture, left foot: Secondary | ICD-10-CM | POA: Diagnosis not present

## 2021-01-17 DIAGNOSIS — E538 Deficiency of other specified B group vitamins: Secondary | ICD-10-CM | POA: Diagnosis not present

## 2021-02-01 DIAGNOSIS — E1151 Type 2 diabetes mellitus with diabetic peripheral angiopathy without gangrene: Secondary | ICD-10-CM | POA: Diagnosis not present

## 2021-02-01 DIAGNOSIS — I739 Peripheral vascular disease, unspecified: Secondary | ICD-10-CM | POA: Diagnosis not present

## 2021-02-01 DIAGNOSIS — L84 Corns and callosities: Secondary | ICD-10-CM | POA: Diagnosis not present

## 2021-02-01 DIAGNOSIS — L603 Nail dystrophy: Secondary | ICD-10-CM | POA: Diagnosis not present

## 2021-02-16 NOTE — Telephone Encounter (Signed)
Spoke with the patient who states that he still has plenty of Xarelto left. He will notify us when he is close to running out.

## 2021-02-16 NOTE — Telephone Encounter (Signed)
Left message for patient to call back  

## 2021-02-22 DIAGNOSIS — H401132 Primary open-angle glaucoma, bilateral, moderate stage: Secondary | ICD-10-CM | POA: Diagnosis not present

## 2021-02-22 DIAGNOSIS — H18593 Other hereditary corneal dystrophies, bilateral: Secondary | ICD-10-CM | POA: Diagnosis not present

## 2021-03-01 DIAGNOSIS — D510 Vitamin B12 deficiency anemia due to intrinsic factor deficiency: Secondary | ICD-10-CM | POA: Diagnosis not present

## 2021-03-08 ENCOUNTER — Other Ambulatory Visit: Payer: Self-pay | Admitting: Cardiology

## 2021-03-15 ENCOUNTER — Telehealth: Payer: Self-pay | Admitting: Cardiology

## 2021-03-15 MED ORDER — XARELTO 20 MG PO TABS: 20.0000 mg | ORAL_TABLET | Freq: Every day | ORAL | 1 refills | Status: DC

## 2021-03-15 NOTE — Telephone Encounter (Signed)
Pt last saw Dr Radford Pax 11/16/20, last labs 11/16/20 Creat 1.40, age 87, weight 92.5kg, CrCl 51.39, based on specified criteria pt is on appropriate dosage of Xarelto 20mg  QD.  Will refill rx.

## 2021-03-15 NOTE — Telephone Encounter (Signed)
   *  STAT* If patient is at the pharmacy, call can be transferred to refill team.   1. Which medications need to be refilled? (please list name of each medication and dose if known) XARELTO 20 MG TABS tablet  2. Which pharmacy/location (including street and city if local pharmacy) is medication to be sent to? [facility] [place], [place] AT Portal to Registered [facility] Sites  3. Do they need a 30 day or 90 day supply? 90 days

## 2021-04-12 DIAGNOSIS — E538 Deficiency of other specified B group vitamins: Secondary | ICD-10-CM | POA: Diagnosis not present

## 2021-05-03 DIAGNOSIS — L97522 Non-pressure chronic ulcer of other part of left foot with fat layer exposed: Secondary | ICD-10-CM | POA: Diagnosis not present

## 2021-05-17 DIAGNOSIS — L97522 Non-pressure chronic ulcer of other part of left foot with fat layer exposed: Secondary | ICD-10-CM | POA: Diagnosis not present

## 2021-05-24 DIAGNOSIS — E538 Deficiency of other specified B group vitamins: Secondary | ICD-10-CM | POA: Diagnosis not present

## 2021-05-30 ENCOUNTER — Telehealth: Payer: Self-pay | Admitting: Cardiology

## 2021-05-30 DIAGNOSIS — I4821 Permanent atrial fibrillation: Secondary | ICD-10-CM

## 2021-05-30 DIAGNOSIS — G4733 Obstructive sleep apnea (adult) (pediatric): Secondary | ICD-10-CM

## 2021-05-30 DIAGNOSIS — I10 Essential (primary) hypertension: Secondary | ICD-10-CM

## 2021-05-30 NOTE — Telephone Encounter (Signed)
Spoke with the patient who states that he is almost out of his xarelto and would like to know if he still needs to switch to Eliquis.  Notes from lab work 11/16/20: Sueanne Margarita, MD  11/17/2020 12:46 PM EST     Slightly elevated SCr likely related to lasix for LE edema. Please change Xarelto to Eliquis '5mg'$  BID and repeat BMET in 1 week.  Please encouraged him to avoid NSAIDs  Patient had recently refilled xarelto and did not want them to go to waste.  He is now about out and would like to know if he still should switch to Eliquis. Advised that I will send a message to Dr. Radford Pax to ensure he should still switch and whether lab work is needed again prior.

## 2021-05-30 NOTE — Telephone Encounter (Signed)
Patient will come by tomorrow for BMET

## 2021-05-30 NOTE — Telephone Encounter (Signed)
   Pt c/o medication issue:  1. Name of Medication: XARELTO 20 MG TABS tablet  2. How are you currently taking this medication (dosage and times per day)? Take 1 tablet (20 mg total) by mouth daily.  3. Are you having a reaction (difficulty breathing--STAT)?   4. What is your medication issue? Pt said he remembered that Dr. Radford Pax is changing his blood thinner. He wanted to speak with Carly to ask if he still need to be on xarelto or the new blood thinner

## 2021-05-31 ENCOUNTER — Other Ambulatory Visit: Payer: Medicare Other | Admitting: *Deleted

## 2021-05-31 ENCOUNTER — Other Ambulatory Visit: Payer: Self-pay

## 2021-05-31 DIAGNOSIS — I10 Essential (primary) hypertension: Secondary | ICD-10-CM

## 2021-05-31 DIAGNOSIS — G4733 Obstructive sleep apnea (adult) (pediatric): Secondary | ICD-10-CM

## 2021-05-31 DIAGNOSIS — I4821 Permanent atrial fibrillation: Secondary | ICD-10-CM

## 2021-05-31 LAB — BASIC METABOLIC PANEL
BUN/Creatinine Ratio: 18 (ref 10–24)
BUN: 27 mg/dL (ref 8–27)
CO2: 24 mmol/L (ref 20–29)
Calcium: 9.5 mg/dL (ref 8.6–10.2)
Chloride: 101 mmol/L (ref 96–106)
Creatinine, Ser: 1.47 mg/dL — ABNORMAL HIGH (ref 0.76–1.27)
Glucose: 118 mg/dL — ABNORMAL HIGH (ref 65–99)
Potassium: 4.2 mmol/L (ref 3.5–5.2)
Sodium: 138 mmol/L (ref 134–144)
eGFR: 47 mL/min/{1.73_m2} — ABNORMAL LOW (ref >59)

## 2021-06-01 DIAGNOSIS — H18593 Other hereditary corneal dystrophies, bilateral: Secondary | ICD-10-CM | POA: Diagnosis not present

## 2021-06-01 DIAGNOSIS — H401132 Primary open-angle glaucoma, bilateral, moderate stage: Secondary | ICD-10-CM | POA: Diagnosis not present

## 2021-06-01 DIAGNOSIS — H04123 Dry eye syndrome of bilateral lacrimal glands: Secondary | ICD-10-CM | POA: Diagnosis not present

## 2021-06-08 ENCOUNTER — Telehealth: Payer: Self-pay

## 2021-06-08 DIAGNOSIS — I4821 Permanent atrial fibrillation: Secondary | ICD-10-CM

## 2021-06-08 MED ORDER — APIXABAN 5 MG PO TABS: 5.0000 mg | ORAL_TABLET | Freq: Two times a day (BID) | ORAL | 11 refills | Status: DC

## 2021-06-08 MED ORDER — FUROSEMIDE 20 MG PO TABS: 20.0000 mg | ORAL_TABLET | ORAL | 3 refills | Status: DC

## 2021-06-08 NOTE — Telephone Encounter (Signed)
Left message for patient to call back  

## 2021-06-08 NOTE — Telephone Encounter (Signed)
Spoke with the patient and advised him to discontinue NSAIDs. Advised to start taking Lasix qod. He will discontinue xarelto and start on Elqiuis 5 mg BID. He will repeat lab work in one week.

## 2021-06-08 NOTE — Telephone Encounter (Signed)
-----   Message from Sueanne Margarita, MD sent at 06/06/2021  8:52 PM EDT ----- Change Lasix to '20mg'$  qod and stop NSAIDs.  Repeat BMET in 1 week.  Change form xarelto to Eliquis '5mg'$  BID ----- Message ----- From: Antonieta Iba, RN Sent: 06/02/2021  12:19 PM EDT To: Sueanne Margarita, MD  The patient has been notified of the result and verbalized understanding.  All questions (if any) were answered. Antonieta Iba, RN 06/02/2021 12:18 PM   He is currently taking Lasix 20 mg daily. He reports taking meloxicam about once per week.  Lab work done to see about switching patient from [place] to [employer]

## 2021-06-09 ENCOUNTER — Telehealth: Payer: Self-pay | Admitting: Cardiology

## 2021-06-09 MED ORDER — APIXABAN 5 MG PO TABS: 5.0000 mg | ORAL_TABLET | Freq: Two times a day (BID) | ORAL | 1 refills | Status: DC

## 2021-06-09 NOTE — Telephone Encounter (Signed)
*  STAT* If patient is at the pharmacy, call can be transferred to refill team.   1. Which medications need to be refilled? (please list name of each medication and dose if known)  apixaban (ELIQUIS) 5 MG TABS tablet  2. Which pharmacy/location (including street and city if local pharmacy) is medication to be sent to? [facility] [place] [place] [facility] Portal to Registered [facility] Sites  3. Do they need a 30 day or 90 day supply?  90 day supply

## 2021-06-09 NOTE — Telephone Encounter (Signed)
Prescription refill request for Eliquis received. Indication: Afib  Last office visit: 11/16/20 [place] Pax)  Scr: 1.47 (05/31/21)  Age: 87 Weight: 92.5kg  Appropriate dose and refill sent to requested pharmacy.

## 2021-06-16 ENCOUNTER — Other Ambulatory Visit: Payer: Medicare Other

## 2021-06-16 ENCOUNTER — Other Ambulatory Visit: Payer: Self-pay

## 2021-06-16 DIAGNOSIS — I4821 Permanent atrial fibrillation: Secondary | ICD-10-CM

## 2021-06-16 LAB — BASIC METABOLIC PANEL
BUN/Creatinine Ratio: 13 (ref 10–24)
BUN: 15 mg/dL (ref 8–27)
CO2: 21 mmol/L (ref 20–29)
Calcium: 9.6 mg/dL (ref 8.6–10.2)
Chloride: 100 mmol/L (ref 96–106)
Creatinine, Ser: 1.16 mg/dL (ref 0.76–1.27)
Glucose: 141 mg/dL — ABNORMAL HIGH (ref 65–99)
Potassium: 4 mmol/L (ref 3.5–5.2)
Sodium: 138 mmol/L (ref 134–144)
eGFR: 62 mL/min/{1.73_m2} (ref >59)

## 2021-07-05 DIAGNOSIS — D510 Vitamin B12 deficiency anemia due to intrinsic factor deficiency: Secondary | ICD-10-CM | POA: Diagnosis not present

## 2021-07-05 DIAGNOSIS — Z23 Encounter for immunization: Secondary | ICD-10-CM | POA: Diagnosis not present

## 2021-08-16 DIAGNOSIS — E538 Deficiency of other specified B group vitamins: Secondary | ICD-10-CM | POA: Diagnosis not present

## 2021-08-21 ENCOUNTER — Other Ambulatory Visit: Payer: Self-pay | Admitting: Cardiology

## 2021-08-23 ENCOUNTER — Other Ambulatory Visit: Payer: Self-pay | Admitting: Cardiology

## 2021-08-23 ENCOUNTER — Other Ambulatory Visit: Payer: Self-pay

## 2021-08-23 MED ORDER — FUROSEMIDE 20 MG PO TABS: 20.0000 mg | ORAL_TABLET | ORAL | 0 refills | Status: DC

## 2021-09-27 DIAGNOSIS — D510 Vitamin B12 deficiency anemia due to intrinsic factor deficiency: Secondary | ICD-10-CM | POA: Diagnosis not present

## 2021-11-08 DIAGNOSIS — D510 Vitamin B12 deficiency anemia due to intrinsic factor deficiency: Secondary | ICD-10-CM | POA: Diagnosis not present

## 2021-11-22 ENCOUNTER — Other Ambulatory Visit: Payer: Self-pay | Admitting: Cardiology

## 2021-12-08 DIAGNOSIS — I10 Essential (primary) hypertension: Secondary | ICD-10-CM | POA: Diagnosis not present

## 2021-12-08 DIAGNOSIS — G629 Polyneuropathy, unspecified: Secondary | ICD-10-CM | POA: Diagnosis not present

## 2021-12-08 DIAGNOSIS — E781 Pure hyperglyceridemia: Secondary | ICD-10-CM | POA: Diagnosis not present

## 2021-12-08 DIAGNOSIS — Z0000 Encounter for general adult medical examination without abnormal findings: Secondary | ICD-10-CM | POA: Diagnosis not present

## 2021-12-08 DIAGNOSIS — E538 Deficiency of other specified B group vitamins: Secondary | ICD-10-CM | POA: Diagnosis not present

## 2021-12-08 DIAGNOSIS — N183 Chronic kidney disease, stage 3 unspecified: Secondary | ICD-10-CM | POA: Diagnosis not present

## 2021-12-08 DIAGNOSIS — I4891 Unspecified atrial fibrillation: Secondary | ICD-10-CM | POA: Diagnosis not present

## 2021-12-08 DIAGNOSIS — E1169 Type 2 diabetes mellitus with other specified complication: Secondary | ICD-10-CM | POA: Diagnosis not present

## 2021-12-08 DIAGNOSIS — D510 Vitamin B12 deficiency anemia due to intrinsic factor deficiency: Secondary | ICD-10-CM | POA: Diagnosis not present

## 2021-12-08 DIAGNOSIS — Z1389 Encounter for screening for other disorder: Secondary | ICD-10-CM | POA: Diagnosis not present

## 2021-12-08 DIAGNOSIS — H409 Unspecified glaucoma: Secondary | ICD-10-CM | POA: Diagnosis not present

## 2021-12-08 DIAGNOSIS — E1149 Type 2 diabetes mellitus with other diabetic neurological complication: Secondary | ICD-10-CM | POA: Diagnosis not present

## 2021-12-14 DIAGNOSIS — E119 Type 2 diabetes mellitus without complications: Secondary | ICD-10-CM | POA: Diagnosis not present

## 2021-12-14 DIAGNOSIS — H18593 Other hereditary corneal dystrophies, bilateral: Secondary | ICD-10-CM | POA: Diagnosis not present

## 2021-12-14 DIAGNOSIS — H401132 Primary open-angle glaucoma, bilateral, moderate stage: Secondary | ICD-10-CM | POA: Diagnosis not present

## 2021-12-14 DIAGNOSIS — H52203 Unspecified astigmatism, bilateral: Secondary | ICD-10-CM | POA: Diagnosis not present

## 2022-01-13 ENCOUNTER — Other Ambulatory Visit: Payer: Self-pay | Admitting: Cardiology

## 2022-01-16 NOTE — Telephone Encounter (Signed)
Prescription refill request for Eliquis received. ?Indication: Atrial Fib ?Last office visit: 11/16/20  Ashok Norris MD ?Scr: 1.20 on 12/08/21 ?Age: 88 ?Weight: 92.5kg ? ?Based on above findings Eliquis '5mg'$  twice daily is the appropriate dose.  Refill approved. ? ?

## 2022-01-19 DIAGNOSIS — E538 Deficiency of other specified B group vitamins: Secondary | ICD-10-CM | POA: Diagnosis not present

## 2022-02-14 ENCOUNTER — Encounter: Payer: Self-pay | Admitting: Cardiology

## 2022-02-14 ENCOUNTER — Ambulatory Visit (INDEPENDENT_AMBULATORY_CARE_PROVIDER_SITE_OTHER): Payer: Medicare Other | Admitting: Cardiology

## 2022-02-14 VITALS — BP 136/80 | HR 82 | Ht 68.0 in | Wt 198.4 lb

## 2022-02-14 DIAGNOSIS — I4821 Permanent atrial fibrillation: Secondary | ICD-10-CM

## 2022-02-14 DIAGNOSIS — I5032 Chronic diastolic (congestive) heart failure: Secondary | ICD-10-CM | POA: Diagnosis not present

## 2022-02-14 DIAGNOSIS — I10 Essential (primary) hypertension: Secondary | ICD-10-CM

## 2022-02-14 DIAGNOSIS — G4733 Obstructive sleep apnea (adult) (pediatric): Secondary | ICD-10-CM | POA: Diagnosis not present

## 2022-02-14 NOTE — Patient Instructions (Signed)
Medication Instructions:  ?Your physician recommends that you continue on your current medications as directed. Please refer to the Current Medication list given to you today. ? ?*If you need a refill on your cardiac medications before your next appointment, please call your pharmacy* ? ?Lab Work: ?TODAY: BMET and CBC ?If you have labs (blood work) drawn today and your tests are completely normal, you will receive your results only by: ?MyChart Message (if you have MyChart) OR ?A paper copy in the mail ?If you have any lab test that is abnormal or we need to change your treatment, we will call you to review the results. ? ?Follow-Up: ?At [facility], you and your health needs are our priority.  As part of our continuing mission to provide you with exceptional heart care, we have created designated Provider Care Teams.  These Care Teams include your primary Cardiologist (physician) and Advanced Practice Providers (APPs -  Physician Assistants and Nurse Practitioners) who all work together to provide you with the care you need, when you need it.   ? ?Your next appointment:   ?After receiving your new device.  ? ?The format for your next appointment:   ?In Person ? ?Provider:   ?Fransico Him, MD   ? ? ?Important Information About Sugar ? ? ? ? ?  ?

## 2022-02-14 NOTE — Progress Notes (Addendum)
?Cardiology Office Note:   ? ?Date:  02/14/2022  ? ?ID:  Cameron [name], DOB 10/24/1933, MRN [id number] ? ?PCP:  Gaynelle Arabian, MD  ?Cardiologist:  Fransico Him, MD   ? ?Referring MD: Gaynelle Arabian, MD  ? ?Chief Complaint  ?Patient presents with  ? Sleep Apnea  ? Atrial Fibrillation  ? Hypertension  ? Congestive Heart Failure  ? ? ?History of Present Illness:   ? ?Cameron [name] is a 88 y.o. male with a hx of permanent atrial fibrillation, chronic diastolic CHF, asymptomatic bradycardia, systemic anticoagulation and OSA on CPAP.  He has moderate OSA with an AHI of 17.53/hr and is on CPAP at 7cm H2O.   ? ?He is here today for followup and is doing well.  He has chronic DOE with extreme exertion but this is chronic and stable.  He denies any chest pain or pressure, PND, orthopnea, LE edema, dizziness, palpitations or syncope. He has occasional LE edema that is stable. He is compliant with his meds and is tolerating meds with no SE.  ? ?He is doing well with his CPAP device and thinks that he has gotten used to it.  He tolerates the mask and feels the pressure is adequate.  Since going on CPAP he feels rested in the am and has no significant daytime sleepiness.  He denies any significant mouth or nasal dryness or nasal congestion.  He does not think that he snores.    ? ?Past Medical History:  ?Diagnosis Date  ? Acute cholecystitis 09/02/2014  ? Anemia   ? hx of  ? Arthritis   ? Borderline diabetes   ? borderline  ? Chronic anticoagulation   ? Chronic diastolic (congestive) heart failure (HCC)   ? CKD (chronic kidney disease), stage II   ? GERD (gastroesophageal reflux disease)   ? Gout   ? in 70's, "not in toe but everywhere else"  ? Hypertension   ? IFG (impaired fasting glucose) 09/05/2014  ? Low back pain   ? when lying flat  ? Neuromuscular disorder ([place])   ? OSA (obstructive sleep apnea)   ? moderate with AHI 17.53/hr now on CPAP at 7cm H2O  ? Peripheral neuropathy   ? both feet, can feel pain  ? Permanent  atrial fibrillation ([place])   ? Rosacea   ? Swelling   ? both legs sometimes  ? Vitamin B12 deficiency   ? ? ?Past Surgical History:  ?Procedure Laterality Date  ? APPENDECTOMY    ? 1997  ? CARDIOVERSION    ? at least 5 times  ? CARDIOVERSION N/A 10/24/2013  ? Procedure: CARDIOVERSION;  Surgeon: Sueanne Margarita, MD;  Location: [facility];  Service: Cardiovascular;  Laterality: N/A;  ? CATARACT EXTRACTION Bilateral   ? CHOLECYSTECTOMY N/A 11/16/2014  ? Procedure: ATTEMPTED LAPAROSCOPIC CHOLECYSTECTOMY TO OPEN CHOLECYSTECTOMY WITH LYSIS OF ADHESIONS - 30MINUTES;  Surgeon: Armandina Gemma, MD;  Location: [facility];  Service: General;  Laterality: N/A;  ? COLON SURGERY    ? EYE SURGERY    ? HEMICOLECTOMY  03/1996  ? with 3 lymph nodes  ? HERNIA REPAIR    ? INGUINAL HERNIA REPAIR Left 08/21/2013  ? Procedure:  LEFT INGUINAL HERNIA REPAIR WITH MESH;  Surgeon: Earnstine Regal, MD;  [facility];  Service: General;  Laterality: Left;  ? INGUINAL HERNIA REPAIR Right 01/29/2014  ? Procedure:  REPAIR RIGHT INGUNIAL HERNIA WITH MESH;  Surgeon: Earnstine Regal, MD;  [facility];  Service: General;  Laterality: Right;  ? INSERTION OF MESH Left 08/21/2013  ? Procedure: INSERTION OF MESH;  Surgeon: Earnstine Regal, MD;  [facility];  Service: General;  Laterality: Left;  ? TUMOR REMOVAL Right 1961  ? back of leg  ? ? ?Current Medications: ?Current Meds  ?Medication Sig  ? bimatoprost (LUMIGAN) 0.01 % SOLN Place 1 drop into both eyes at bedtime.  ? cyanocobalamin (,VITAMIN B-12,) 1000 MCG/ML injection Inject 1,000 mcg into the muscle every 6 (six) weeks.  ? doxazosin (CARDURA) 4 MG tablet TAKE 1 TABLET(4 MG) BY MOUTH DAILY  ? ELIQUIS 5 MG TABS tablet TAKE 1 TABLET TWICE A DAY  ? folic acid (FOLVITE) 1 MG tablet Take 1 mg by mouth daily.  ? furosemide (LASIX) 20 MG tablet Take 1 tablet (20 mg total) by mouth every other day. Please schedule an appt. With Dr. Radford Pax in order to receive future refills. Thank You. 1st Attempt.  ? metoprolol  succinate (TOPROL-XL) 25 MG 24 hr tablet TAKE 1 TABLET(25 MG) BY MOUTH TWICE DAILY  ? traMADol (ULTRAM) 50 MG tablet Take 50 mg by mouth every 4 (four) hours as needed for moderate pain or severe pain. Maximum dose= 8 tablets per day  ?  ? ?Allergies:   Ace inhibitors and Clonidine derivatives  ? ?Social History  ? ?Socioeconomic History  ? Marital status: Widowed  ?  Spouse name: Not on file  ? Number of children: Not on file  ? Years of education: Not on file  ? Highest education level: Not on file  ?Occupational History  ? Occupation: Retired   ?Tobacco Use  ? Smoking status: Former  ?  Packs/day: 2.00  ?  Years: 20.00  ?  Pack years: 40.00  ?  Types: Cigarettes  ?  Quit date: 10/02/1974  ?  Years since quitting: 47.4  ? Smokeless tobacco: Never  ?Vaping Use  ? Vaping Use: Never used  ?Substance and Sexual Activity  ? Alcohol use: Yes  ?  Alcohol/week: 0.0 standard drinks  ?  Comment: occasionally  ? Drug use: No  ? Sexual activity: Not on file  ?Other Topics Concern  ? Not on file  ?Social History Narrative  ? Pt lives in [place] with spouse.  ? Retired in 1999 from the [employer].  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  ?   ?   ? ?Social Determinants of Health  ? ?Financial Resource Strain: Not on file  ?Food Insecurity: Not on file  ?Transportation Needs: Not on file  ?Physical Activity: Not on file  ?Stress: Not on file  ?Social Connections: Not on file  ?  ? ?Family History: ?The patient's family history includes Allergies in his daughter; Asthma in his daughter; Cancer in his father; Dementia in his sister; Stroke (age of onset: 87) in his mother. ? ?ROS:   ?Please see the history of present illness.    ?ROS  ?All other systems reviewed and negative.  ? ?EKGs/Labs/Other Studies Reviewed:   ? ?The following studies were reviewed today: ?PAP compliance download from [place], outside labs from [facility] ? ?EKG:  EKG is ordered today and showed atrial flutter  with variable block with no ST changes ? ?Recent Labs: ?06/16/2021: BUN 15; Creatinine, Ser 1.16; Potassium 4.0; Sodium 138  ? ?Recent Lipid Panel ?No results found for: CHOL, TRIG, HDL, CHOLHDL, VLDL, LDLCALC, LDLDIRECT ? ?Physical Exam:   ? ?VS:  BP 136/80   Pulse 82  Ht '5\' 8"'$  (1.727 m)   Wt 198 lb 6.4 oz (90 kg)   SpO2 99%   BMI 30.17 kg/m?    ? ?Wt Readings from Last 3 Encounters:  ?02/14/22 198 lb 6.4 oz (90 kg)  ?11/16/20 204 lb (92.5 kg)  ?11/17/19 201 lb 12.8 oz (91.5 kg)  ?  ?GEN: Well nourished, well developed in no acute distress ?HEENT: Normal ?NECK: No JVD; No carotid bruits ?LYMPHATICS: No lymphadenopathy ?CARDIAC:irregularly irregular, no murmurs, rubs, gallops ?RESPIRATORY:  Clear to auscultation without rales, wheezing or rhonchi  ?ABDOMEN: Soft, non-tender, non-distended ?MUSCULOSKELETAL:  trace edema; No deformity  ?SKIN: Warm and dry ?NEUROLOGIC:  Alert and oriented x 3 ?PSYCHIATRIC:  Normal affect   ?ASSESSMENT:   ? ?1. OSA (obstructive sleep apnea)   ?2. Permanent atrial fibrillation ([place])   ?3. Primary hypertension   ?4. Chronic diastolic heart failure ([place])   ? ?PLAN:   ? ?In order of problems listed above: ? ?1.  OSA - The patient is tolerating PAP therapy well without any problems. The patient has been using and benefiting from PAP use and will continue to benefit from therapy.  ?-His AHI is 3.3/hr on 7cm h2O and 90% compliant on download ?-he wants a new device because his is > 33 years old so I will order another one once I get his download to review ? ?2.  Permanent atrial fibrillation ?-HR is controlled on exam today and he denies any palpitations.  ?-continue prescription drug management with Eliquis '5mg'$  BID  and Toprol XL '25mg'$  BID with PRN refills ?-check BMET and CBC today ?-he has not had any bleeding problems on DOAC ? ?3.  HTN ?-BP is controlled on exam today ?-continue prescription drug management with Toprol XL '25mg'$  BID and Doxazosin '4mg'$  daily with PRN refills ? ?4.  Chronic  diastolic CHF ?-he does not appear volume overloaded on exam today ?-he does have occasional LE edema ?-continue prescription drug management with Lasix '20mg'$  daily ?-check BMET ? ?Followup with me in 1 year ?  ?

## 2022-02-14 NOTE — Addendum Note (Signed)
Addended by: Antonieta Iba on: 02/14/2022 02:33 PM ? ? Modules accepted: Orders ? ?

## 2022-02-15 LAB — BASIC METABOLIC PANEL
BUN/Creatinine Ratio: 15 (ref 10–24)
BUN: 20 mg/dL (ref 8–27)
CO2: 24 mmol/L (ref 20–29)
Calcium: 9.4 mg/dL (ref 8.6–10.2)
Chloride: 102 mmol/L (ref 96–106)
Creatinine, Ser: 1.35 mg/dL — ABNORMAL HIGH (ref 0.76–1.27)
Glucose: 118 mg/dL — ABNORMAL HIGH (ref 70–99)
Potassium: 3.7 mmol/L (ref 3.5–5.2)
Sodium: 141 mmol/L (ref 134–144)
eGFR: 51 mL/min/{1.73_m2} — ABNORMAL LOW (ref >59)

## 2022-02-15 LAB — CBC
Hematocrit: 40.3 % (ref 37.5–51.0)
Hemoglobin: 13.7 g/dL (ref 13.0–17.7)
MCH: 29 pg (ref 26.6–33.0)
MCHC: 34 g/dL (ref 31.5–35.7)
MCV: 85 fL (ref 79–97)
Platelets: 131 10*3/uL — ABNORMAL LOW (ref 150–450)
RBC: 4.72 x10E6/uL (ref 4.14–5.80)
RDW: 14.8 % (ref 11.6–15.4)
WBC: 6.3 10*3/uL (ref 3.4–10.8)

## 2022-02-16 ENCOUNTER — Telehealth: Payer: Self-pay | Admitting: *Deleted

## 2022-02-16 DIAGNOSIS — G4733 Obstructive sleep apnea (adult) (pediatric): Secondary | ICD-10-CM

## 2022-02-16 NOTE — Telephone Encounter (Signed)
Order placed to [employer] [facility] via community message. 

## 2022-02-16 NOTE — Telephone Encounter (Signed)
-----   Message from Antonieta Iba, RN sent at 02/14/2022  2:39 PM EDT ----- Per Dr. Radford Pax: Please order a new ResMed CPAP at 7cm H2O with heated humidity and mask of choice.  He will need a 6 week follow up after receiving his new device.  Thanks!

## 2022-02-17 ENCOUNTER — Other Ambulatory Visit: Payer: Self-pay | Admitting: Cardiology

## 2022-02-20 ENCOUNTER — Encounter: Payer: Self-pay | Admitting: *Deleted

## 2022-02-20 NOTE — Telephone Encounter (Signed)
-----   Message from Antonieta Iba, RN sent at 02/14/2022  2:39 PM EDT ----- Per Dr. Radford Pax: Please order a new ResMed CPAP at 7cm H2O with heated humidity and mask of choice.  He will need a 6 week follow up after receiving his new device.  Thanks!

## 2022-02-20 NOTE — Telephone Encounter (Signed)
Order placed to [facility] via community message This encounter was created in error - please disregard.

## 2022-03-02 DIAGNOSIS — E538 Deficiency of other specified B group vitamins: Secondary | ICD-10-CM | POA: Diagnosis not present

## 2022-04-13 DIAGNOSIS — D510 Vitamin B12 deficiency anemia due to intrinsic factor deficiency: Secondary | ICD-10-CM | POA: Diagnosis not present

## 2022-05-28 ENCOUNTER — Other Ambulatory Visit: Payer: Self-pay | Admitting: Cardiology

## 2022-05-29 ENCOUNTER — Other Ambulatory Visit: Payer: Self-pay | Admitting: Cardiology

## 2022-05-30 ENCOUNTER — Ambulatory Visit: Payer: Medicare Other | Attending: Cardiology | Admitting: Cardiology

## 2022-05-30 ENCOUNTER — Encounter: Payer: Self-pay | Admitting: Cardiology

## 2022-05-30 VITALS — BP 158/82 | HR 83 | Ht 68.0 in | Wt 204.8 lb

## 2022-05-30 DIAGNOSIS — I10 Essential (primary) hypertension: Secondary | ICD-10-CM

## 2022-05-30 DIAGNOSIS — I4821 Permanent atrial fibrillation: Secondary | ICD-10-CM | POA: Diagnosis not present

## 2022-05-30 DIAGNOSIS — I5032 Chronic diastolic (congestive) heart failure: Secondary | ICD-10-CM

## 2022-05-30 DIAGNOSIS — G4733 Obstructive sleep apnea (adult) (pediatric): Secondary | ICD-10-CM | POA: Diagnosis not present

## 2022-05-30 MED ORDER — METOPROLOL SUCCINATE ER 50 MG PO TB24: 50.0000 mg | ORAL_TABLET | Freq: Every day | ORAL | 3 refills | Status: DC

## 2022-05-30 NOTE — Patient Instructions (Addendum)
Medication Instructions:  Your physician has recommended you make the following change in your medication:  1) CHANGE Toprol XL (metoprolol succinate) to 50 mg daily  *If you need a refill on your cardiac medications before your next appointment, please call your pharmacy*  Follow-Up: At [facility], you and your health needs are our priority.  As part of our continuing mission to provide you with exceptional heart care, we have created designated Provider Care Teams.  These Care Teams include your primary Cardiologist (physician) and Advanced Practice Providers (APPs -  Physician Assistants and Nurse Practitioners) who all work together to provide you with the care you need, when you need it.  Your next appointment:   1 year(s)  The format for your next appointment:   In Person  Provider:   Fransico Him, MD      Important Information About Sugar

## 2022-05-30 NOTE — Addendum Note (Signed)
Addended by: Antonieta Iba on: 05/30/2022 09:04 AM   Modules accepted: Orders

## 2022-05-30 NOTE — Progress Notes (Signed)
Cardiology Office Note:    Date:  05/30/2022   ID:  Cameron [name], DOB 05-Jul-1934, MRN [id number]  PCP:  Gaynelle Arabian, MD  Cardiologist:  Fransico Him, MD    Referring MD: Gaynelle Arabian, MD   Chief Complaint  Patient presents with   Atrial Fibrillation   Hypertension   Sleep Apnea    History of Present Illness:    Cameron [name] is a 88 y.o. male with a hx of permanent atrial fibrillation, chronic diastolic CHF, asymptomatic bradycardia, systemic anticoagulation and OSA on CPAP.  He has moderate OSA with an AHI of 17.53/hr and is on CPAP at 7cm H2O.  He recently got a new CPAP device and is now back for followup per insurance requirements to document compliance.   He is here today for followup and is doing well.  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   He denies any chest pain or pressure, orthopnea, dizziness, palpitations or syncope. He is compliant with his meds and is tolerating meds with no SE.    He is doing well with his PAP device and thinks that he has gotten used to it.  He sleeps 9 hours at night and does not wake up. He tolerates the mask and feels the pressure is adequate.  Since going on PAP he feels rested in the am and has no significant daytime sleepiness.  He denies any significant mouth or nasal dryness or nasal congestion.  He does not think that he snores.    Past Medical History:  Diagnosis Date   Acute cholecystitis 09/02/2014   Anemia    hx of   Arthritis    Borderline diabetes    borderline   Chronic anticoagulation    Chronic diastolic (congestive) heart failure (HCC)    CKD (chronic kidney disease), stage II    GERD (gastroesophageal reflux disease)    Gout    in 70's, "not in toe but everywhere else"   Hypertension    IFG (impaired fasting glucose) 09/05/2014   Low back pain    when lying flat   Neuromuscular disorder (HCC)    OSA (obstructive sleep apnea)    moderate with  AHI 17.53/hr now on CPAP at 7cm H2O   Peripheral neuropathy    both feet, can feel pain   Permanent atrial fibrillation (HCC)    Rosacea    Swelling    both legs sometimes   Vitamin B12 deficiency     Past Surgical History:  Procedure Laterality Date   APPENDECTOMY     1997   CARDIOVERSION     at least 5 times   CARDIOVERSION N/A 10/24/2013   Procedure: CARDIOVERSION;  Surgeon: Sueanne Margarita, MD;  Location: [facility];  Service: Cardiovascular;  Laterality: N/A;   CATARACT EXTRACTION Bilateral    CHOLECYSTECTOMY N/A 11/16/2014   Procedure: ATTEMPTED LAPAROSCOPIC CHOLECYSTECTOMY TO OPEN CHOLECYSTECTOMY WITH LYSIS OF ADHESIONS - 30MINUTES;  Surgeon: Armandina Gemma, MD;  Location: [facility];  Service: General;  Laterality: N/A;   COLON SURGERY     EYE SURGERY     HEMICOLECTOMY  03/1996   with 3 lymph nodes   HERNIA REPAIR     INGUINAL HERNIA REPAIR Left 08/21/2013   Procedure:  LEFT INGUINAL HERNIA REPAIR WITH MESH;  Surgeon: Earnstine Regal, MD;  [facility];  Service: General;  Laterality: Left;   INGUINAL HERNIA REPAIR Right 01/29/2014  Procedure:  REPAIR RIGHT INGUNIAL HERNIA WITH MESH;  Surgeon: Earnstine Regal, MD;  [facility];  Service: General;  Laterality: Right;   INSERTION OF MESH Left 08/21/2013   Procedure: INSERTION OF MESH;  Surgeon: Earnstine Regal, MD;  [facility];  Service: General;  Laterality: Left;   TUMOR REMOVAL Right 1961   back of leg    Current Medications: Current Meds  Medication Sig   bimatoprost (LUMIGAN) 0.01 % SOLN Place 1 drop into both eyes at bedtime.   cyanocobalamin (,VITAMIN B-12,) 1000 MCG/ML injection Inject 1,000 mcg into the muscle every 6 (six) weeks.   doxazosin (CARDURA) 4 MG tablet TAKE 1 TABLET(4 MG) BY MOUTH DAILY   ELIQUIS 5 MG TABS tablet TAKE 1 TABLET TWICE A DAY   folic acid (FOLVITE) 1 MG tablet Take 1 mg by mouth daily.   furosemide (LASIX) 20 MG tablet Take 1 tablet (20 mg total) by mouth every other day. Please  schedule an appt. With Dr. Radford Pax in order to receive future refills. Thank You. 1st Attempt.   metoprolol succinate (TOPROL-XL) 25 MG 24 hr tablet TAKE 1 TABLET(25 MG) BY MOUTH TWICE DAILY   traMADol (ULTRAM) 50 MG tablet Take 50 mg by mouth every 4 (four) hours as needed for moderate pain or severe pain. Maximum dose= 8 tablets per day     Allergies:   Ace inhibitors and Clonidine derivatives   Social History   Socioeconomic History   Marital status: Widowed    Spouse name: Not on file   Number of children: Not on file   Years of education: Not on file   Highest education level: Not on file  Occupational History   Occupation: Retired   Tobacco Use   Smoking status: Former    Packs/day: 2.00    Years: 20.00    Total pack years: 40.00    Types: Cigarettes    Quit date: 10/02/1974    Years since quitting: 47.6   Smokeless tobacco: Never  Vaping Use   Vaping Use: Never used  Substance and Sexual Activity   Alcohol use: Yes    Alcohol/week: 0.0 standard drinks of alcohol    Comment: occasionally   Drug use: No   Sexual activity: Not on file  Other Topics Concern   Not on file  Social History Narrative   Pt lives in [place] with spouse.   Retired in 1999 from the [employer].  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         Social Determinants of Health   Financial Resource Strain: Not on file  Food Insecurity: Not on file  Transportation Needs: Not on file  Physical Activity: Not on file  Stress: Not on file  Social Connections: Not on file     Family History: The patient's family history includes Allergies in his daughter; Asthma in his daughter; Cancer in his father; Dementia in his sister; Stroke (age of onset: 76) in his mother.  ROS:   Please see the history of present illness.    ROS  All other systems reviewed and negative.   EKGs/Labs/Other Studies Reviewed:    The following studies were reviewed  today: PAP compliance download from [place], outside labs from [facility]  EKG:  EKG is not ordered today   Recent Labs: 02/14/2022: BUN 20; Creatinine, Ser 1.35; Hemoglobin 13.7; Platelets 131; Potassium 3.7; Sodium 141   Recent Lipid Panel No results found for: "CHOL", "  TRIG", "HDL", "CHOLHDL", "VLDL", "[place]", "LDLDIRECT"  HYPERTENSION CONTROL Vitals:   05/30/22 0832 05/30/22 0855  BP: (!) 148/62 (!) 158/82    The patient's blood pressure is elevated above target today.  In order to address the patient's elevated BP: A current anti-hypertensive medication was adjusted today.; A referral to the [facility] will be placed.      Physical Exam:    VS:  BP (!) 158/82   Pulse 83   Ht '5\' 8"'$  (1.727 m)   Wt 204 lb 12.8 oz (92.9 kg)   SpO2 98%   BMI 31.14 kg/m     Wt Readings from Last 3 Encounters:  05/30/22 204 lb 12.8 oz (92.9 kg)  02/14/22 198 lb 6.4 oz (90 kg)  11/16/20 204 lb (92.5 kg)    GEN: Well nourished, well developed in no acute distress HEENT: Normal NECK: No JVD; No carotid bruits LYMPHATICS: No lymphadenopathy CARDIAC: irregularly irregular, no murmurs, rubs, gallops RESPIRATORY:  Clear to auscultation without rales, wheezing or rhonchi  ABDOMEN: Soft, non-tender, non-distended MUSCULOSKELETAL:  No edema; No deformity  SKIN: Warm and dry NEUROLOGIC:  Alert and oriented x 3 PSYCHIATRIC:  Normal affect  ASSESSMENT:    1. OSA (obstructive sleep apnea)   2. Permanent atrial fibrillation ([place])   3. Primary hypertension   4. Chronic diastolic heart failure (HCC)    PLAN:    In order of problems listed above:  1.  OSA - The patient is tolerating PAP therapy well without any problems. The PAP download performed by his DME was personally reviewed and interpreted by me today and showed an AHI of 2.8/hr on 7 cm H2O with 77% compliance in using more than 4 hours nightly.  The patient has been using and benefiting from PAP use and will continue to  benefit from therapy.   2.  Permanent atrial fibrillation -HR remains controlled on exam today and denies any palpitations  -continue prescription drug management with Eilquis '5mg'$  BID and Toprol XL '25mg'$  BID with PRN refills.  3.  HTN -BP is poorly controlled on exam today -change Toprol to '50mg'$  daily and not '25mg'$  BID -check Bp daily for a week and bring to followup appt in 1 week with PharmD in HTN clinic  4.  Chronic diastolic CHF -he appears euvolemic on exam today -continue prescription drug management with lasix '20mg'$  daily with PRN refills   Followup with me in 1 year   Medication Adjustments/Labs and Tests Ordered: Current medicines are reviewed at length with the patient today.  Concerns regarding medicines are outlined above.  No orders of the defined types were placed in this encounter.  No orders of the defined types were placed in this encounter.   Signed, Fransico Him, MD  05/30/2022 8:58 AM    [place]

## 2022-06-01 DIAGNOSIS — D510 Vitamin B12 deficiency anemia due to intrinsic factor deficiency: Secondary | ICD-10-CM | POA: Diagnosis not present

## 2022-06-19 DIAGNOSIS — H31001 Unspecified chorioretinal scars, right eye: Secondary | ICD-10-CM | POA: Diagnosis not present

## 2022-06-19 DIAGNOSIS — H43811 Vitreous degeneration, right eye: Secondary | ICD-10-CM | POA: Diagnosis not present

## 2022-06-19 DIAGNOSIS — H401132 Primary open-angle glaucoma, bilateral, moderate stage: Secondary | ICD-10-CM | POA: Diagnosis not present

## 2022-07-04 NOTE — Progress Notes (Signed)
Patient ID: Cameron [name]                 DOB: May 25, 1934                      MRN: [id number]      HPI: Cameron [name] is a 88 y.o. male referred by Dr. Radford Pax to HTN clinic. PMH is significant for permanent atrial fibrillation, chronic diastolic CHF, asymptomatic bradycardia, systemic anticoagulation and OSA on CPAP.No acute concern reported today.He denies any chest pain or pressure, PND, orthopnea, LE edema, dizziness, palpitations or syncope. He is compliant with his meds and is tolerating meds with no SE.  Patient states that his home BP stays in 125-130/70 range. PCP checks His A1c annually and its stays borderline range. Also, weight is staying stable and reports no swelling. After discussing potential benefits of SGLT2i patient is in agreement to add SGLT2i agent to optimize HF treatment   Current HTN meds: metoprolol XL 50 mg,doxazosin 4 mg od   Previously tried: ACE allergy listed - angioedema (patient does not remember the agent)- Clonidine- rash  BP goal: <130/80  Diet: Cook at home so generally eats well balanced food  Exercise:  The patient does not participate in regular exercise at present.However, he is active around the house  Home BP readings: ~ 125-130/70 HEART RATE (HR) ~ 65    Wt Readings from Last 3 Encounters:  07/05/22 201 lb (91.2 kg)  05/30/22 204 lb 12.8 oz (92.9 kg)  02/14/22 198 lb 6.4 oz (90 kg)   BP Readings from Last 3 Encounters:  07/05/22 125/73  05/30/22 (!) 158/82  02/14/22 136/80   Pulse Readings from Last 3 Encounters:  05/30/22 83  02/14/22 82  11/16/20 100    Renal function: CrCl cannot be calculated (Patient's most recent lab result is older than the maximum 21 days allowed.).  Past Medical History:  Diagnosis Date   Acute cholecystitis 09/02/2014   Anemia    hx of   Arthritis    Borderline diabetes    borderline   Chronic anticoagulation    Chronic diastolic (congestive) heart failure (HCC)    CKD (chronic kidney  disease), stage II    GERD (gastroesophageal reflux disease)    Gout    in 70's, "not in toe but everywhere else"   Hypertension    IFG (impaired fasting glucose) 09/05/2014   Low back pain    when lying flat   Neuromuscular disorder (HCC)    OSA (obstructive sleep apnea)    moderate with AHI 17.53/hr now on CPAP at 7cm H2O   Peripheral neuropathy    both feet, can feel pain   Permanent atrial fibrillation (HCC)    Rosacea    Swelling    both legs sometimes   Vitamin B12 deficiency     Current Outpatient Medications on File Prior to Visit  Medication Sig Dispense Refill   bimatoprost (LUMIGAN) 0.01 % SOLN Place 1 drop into both eyes at bedtime.     doxazosin (CARDURA) 4 MG tablet TAKE 1 TABLET(4 MG) BY MOUTH DAILY 90 tablet 2   ELIQUIS 5 MG TABS tablet TAKE 1 TABLET TWICE A DAY 614 tablet 1   folic acid (FOLVITE) 1 MG tablet Take 1 mg by mouth daily.     furosemide (LASIX) 20 MG tablet Take 1 tablet (20 mg total) by mouth every other day. Please schedule an appt. With Dr. Radford Pax in order to receive future  refills. Thank You. 1st Attempt. 30 tablet 0   metoprolol succinate (TOPROL-XL) 50 MG 24 hr tablet Take 1 tablet (50 mg total) by mouth daily. Take with or immediately following a meal. 90 tablet 3   traMADol (ULTRAM) 50 MG tablet Take 50 mg by mouth every 4 (four) hours as needed for moderate pain or severe pain. Maximum dose= 8 tablets per day     cyanocobalamin (,VITAMIN B-12,) 1000 MCG/ML injection Inject 1,000 mcg into the muscle every 6 (six) weeks.     No current facility-administered medications on file prior to visit.    Allergies  Allergen Reactions   Ace Inhibitors Other (See Comments)    Lips inflated to about "20 pound"   Clonidine Derivatives Rash    Blood pressure 125/73, weight 201 lb (91.2 kg).  Chronic diastolic heart failure Assessment:  BP- controlled (goal <130/80) in office readings was 125/73 today.  Current medications are dexazocin 4 mg daily and  metoprolol 50 mg daily. Patient is tolerating them well without any side effects  Denies any chest pain or pressure, PND, orthopnea, LE edema, dizziness, palpitations or syncope.  To optimize heart failure (HF) medications patient is in agreement to initiate SGLT2i Will work on [place] for [employer].   Plan: Start Jardiance 10 mg by mouth once daily  Continue taking other BP medications (doxazosin '4mg'$  daily and metoprolol XL 50 mg daily)  Patient to go for BMP post SGLT2i initiation in 4 weeks   Follow up: will inform patient upon approval of Jardiance PA.   Thank you.  Cammy Copa, Pharm.D [facility] of [facility] [address] [place], [place] [address]  Phone: [phone]; Fax: [phone]

## 2022-07-04 NOTE — Assessment & Plan Note (Signed)
Assessment:     Plan:

## 2022-07-05 ENCOUNTER — Ambulatory Visit: Payer: Medicare Other | Attending: Internal Medicine | Admitting: Student

## 2022-07-05 VITALS — BP 125/73 | Wt 201.0 lb

## 2022-07-05 DIAGNOSIS — I5032 Chronic diastolic (congestive) heart failure: Secondary | ICD-10-CM | POA: Insufficient documentation

## 2022-07-05 DIAGNOSIS — I10 Essential (primary) hypertension: Secondary | ICD-10-CM | POA: Insufficient documentation

## 2022-07-05 MED ORDER — EMPAGLIFLOZIN 10 MG PO TABS: 10.0000 mg | ORAL_TABLET | Freq: Every day | ORAL | 3 refills | Status: DC

## 2022-07-05 NOTE — Assessment & Plan Note (Signed)
Assessment:   BP- controlled (goal <130/80) in office readings was 125/73 today.   Current medications are dexazocin 4 mg daily and metoprolol 50 mg daily. Patient is tolerating them well without any side effects   Denies any chest pain or pressure, PND, orthopnea, LE edema, dizziness, palpitations or syncope.   To optimize heart failure (HF) medications patient is in agreement to initiate SGLT2i  Will work on [place] for [employer].   Plan:  Start Jardiance 10 mg by mouth once daily   Continue taking other BP medications (doxazosin '4mg'$  daily and metoprolol XL 50 mg daily)   Patient to go for BMP post SGLT2i initiation in 4 weeks

## 2022-07-11 ENCOUNTER — Telehealth: Payer: Self-pay | Admitting: Cardiology

## 2022-07-11 NOTE — Telephone Encounter (Signed)
Pt c/o medication issue:  1. Name of Medication:  empagliflozin (JARDIANCE) 10 MG TABS tablet   2. How are you currently taking this medication (dosage and times per day)?   3. Are you having a reaction (difficulty breathing--STAT)?   4. What is your medication issue?   Patient states he received a call this monring stating a PA is needed for this medication. he doesn't know if this call was from his insurance [employer] or [employer], but they advised this medication will not be distributed until the PA is received.

## 2022-07-11 NOTE — Telephone Encounter (Addendum)
**Note De-Identified Jaycob Mcclenton Obfuscation** We received a Jardiance PA form Baylei Siebels fax from [employer]. I have completed the form, added office visit notes from 10/04 with Cammy Copa, Pharm.D and faxed all back to the Prior Approval Dept at [phone] as instructed in the letter attached to the PA form. The form did not require a MDs signature.  The pt is aware.  Fax: Tx 'ok' Report CONE_EMAIL-to-Fax Kahlia Lagunes, Mardene Celeste   This message was sent Karolyna Bianchini [facility], a product from [employer] http://www.biscom.com/  [employer] pioneered [employer] and continues to innovate the most advanced and intelligent fax and secure messaging solutions for enterprises. www.biscom.com                     -------Fax Transmission Report-------  To:               Recipient at [phone] Subject:          Fw: BCBS-Service Benefit Plan Prior Approval Result:           The transmission was successful. Explanation:      All Pages Ok Pages Sent:       7 Connect Time:     2 minutes, 50 seconds Transmit Time:    07/11/2022 11:20 Transfer Rate:    14400 Status Code:      0000 Retry Count:      0 Job Id:           [id number] Unique Id:        [id number] Fax Line:         12 Fax Server:       [id number]

## 2022-07-13 ENCOUNTER — Other Ambulatory Visit: Payer: Self-pay | Admitting: Cardiology

## 2022-07-13 DIAGNOSIS — I4821 Permanent atrial fibrillation: Secondary | ICD-10-CM

## 2022-07-13 DIAGNOSIS — D510 Vitamin B12 deficiency anemia due to intrinsic factor deficiency: Secondary | ICD-10-CM | POA: Diagnosis not present

## 2022-07-13 NOTE — Telephone Encounter (Signed)
Eliquis '5mg'$  refill request received. Patient is 88 years old, weight-91.2kg, Crea-1.35 on 02/14/2022, Diagnosis-Afib, and last seen by Dr. Radford Pax on 02/14/2022. Dose is appropriate based on dosing criteria. Will send in refill to requested pharmacy.

## 2022-07-14 NOTE — Telephone Encounter (Signed)
**Note De-Identified Darian Cansler Obfuscation** Per letter received Bryan Omura fax from [facility], they have approved the pts Jardiance for coverage until 07/13/2023.  [facility] [place] [facility] [place] [facility] (Ph: [phone]) is aware of this approval.

## 2022-08-17 ENCOUNTER — Ambulatory Visit: Payer: Medicare Other | Attending: Cardiology

## 2022-08-17 DIAGNOSIS — I5032 Chronic diastolic (congestive) heart failure: Secondary | ICD-10-CM | POA: Diagnosis not present

## 2022-08-17 LAB — BASIC METABOLIC PANEL
BUN/Creatinine Ratio: 15 (ref 10–24)
BUN: 21 mg/dL (ref 8–27)
CO2: 24 mmol/L (ref 20–29)
Calcium: 9.6 mg/dL (ref 8.6–10.2)
Chloride: 103 mmol/L (ref 96–106)
Creatinine, Ser: 1.38 mg/dL — ABNORMAL HIGH (ref 0.76–1.27)
Glucose: 115 mg/dL — ABNORMAL HIGH (ref 70–99)
Potassium: 3.5 mmol/L (ref 3.5–5.2)
Sodium: 141 mmol/L (ref 134–144)
eGFR: 50 mL/min/{1.73_m2} — ABNORMAL LOW (ref >59)

## 2022-08-18 ENCOUNTER — Telehealth: Payer: Self-pay

## 2022-08-18 DIAGNOSIS — I5032 Chronic diastolic (congestive) heart failure: Secondary | ICD-10-CM

## 2022-08-18 MED ORDER — POTASSIUM CHLORIDE CRYS ER 20 MEQ PO TBCR: 20.0000 meq | EXTENDED_RELEASE_TABLET | Freq: Every day | ORAL | 3 refills | Status: DC

## 2022-08-18 NOTE — Telephone Encounter (Signed)
The patient has been notified of the result and verbalized understanding.  All questions (if any) were answered. Bernestine Amass, RN 08/18/2022 11:05 AM  Labs have been scheduled.

## 2022-08-18 NOTE — Telephone Encounter (Signed)
-----   Message from Sueanne Margarita, MD sent at 08/18/2022  8:45 AM EST ----- Potassium on low side of normal - add Kdur 58mq daily and repeat BMET in 1 week

## 2022-08-23 DIAGNOSIS — D510 Vitamin B12 deficiency anemia due to intrinsic factor deficiency: Secondary | ICD-10-CM | POA: Diagnosis not present

## 2022-08-28 ENCOUNTER — Ambulatory Visit: Payer: Medicare Other | Attending: Cardiology

## 2022-08-28 DIAGNOSIS — I5032 Chronic diastolic (congestive) heart failure: Secondary | ICD-10-CM

## 2022-08-29 ENCOUNTER — Telehealth: Payer: Self-pay

## 2022-08-29 DIAGNOSIS — Z79899 Other long term (current) drug therapy: Secondary | ICD-10-CM

## 2022-08-29 DIAGNOSIS — I10 Essential (primary) hypertension: Secondary | ICD-10-CM

## 2022-08-29 LAB — BASIC METABOLIC PANEL
BUN/Creatinine Ratio: 14 (ref 10–24)
BUN: 20 mg/dL (ref 8–27)
CO2: 23 mmol/L (ref 20–29)
Calcium: 9.6 mg/dL (ref 8.6–10.2)
Chloride: 105 mmol/L (ref 96–106)
Creatinine, Ser: 1.48 mg/dL — ABNORMAL HIGH (ref 0.76–1.27)
Glucose: 126 mg/dL — ABNORMAL HIGH (ref 70–99)
Potassium: 4 mmol/L (ref 3.5–5.2)
Sodium: 145 mmol/L — ABNORMAL HIGH (ref 134–144)
eGFR: 46 mL/min/{1.73_m2} — ABNORMAL LOW (ref >59)

## 2022-08-29 MED ORDER — FUROSEMIDE 20 MG PO TABS: 20.0000 mg | ORAL_TABLET | ORAL | 11 refills | Status: DC | PRN

## 2022-08-29 NOTE — Telephone Encounter (Signed)
-----   Message from Sueanne Margarita, MD sent at 08/29/2022  9:30 AM EST ----- SCr bumped - please have him use Lasix PRN for edema or weight gain more than 3 lbs in a day and not daily and repeat BMET in 1 week

## 2022-08-29 NOTE — Telephone Encounter (Signed)
Patient will come in next Tuesday for lab work.

## 2022-09-05 ENCOUNTER — Ambulatory Visit: Payer: Medicare Other | Attending: Cardiology

## 2022-09-05 DIAGNOSIS — Z79899 Other long term (current) drug therapy: Secondary | ICD-10-CM

## 2022-09-05 DIAGNOSIS — I10 Essential (primary) hypertension: Secondary | ICD-10-CM

## 2022-09-05 LAB — BASIC METABOLIC PANEL
BUN/Creatinine Ratio: 14 (ref 10–24)
BUN: 21 mg/dL (ref 8–27)
CO2: 22 mmol/L (ref 20–29)
Calcium: 9.4 mg/dL (ref 8.6–10.2)
Chloride: 104 mmol/L (ref 96–106)
Creatinine, Ser: 1.49 mg/dL — ABNORMAL HIGH (ref 0.76–1.27)
Glucose: 107 mg/dL — ABNORMAL HIGH (ref 70–99)
Potassium: 4.1 mmol/L (ref 3.5–5.2)
Sodium: 140 mmol/L (ref 134–144)
eGFR: 45 mL/min/{1.73_m2} — ABNORMAL LOW (ref >59)

## 2022-10-04 DIAGNOSIS — E538 Deficiency of other specified B group vitamins: Secondary | ICD-10-CM | POA: Diagnosis not present

## 2022-11-15 DIAGNOSIS — D510 Vitamin B12 deficiency anemia due to intrinsic factor deficiency: Secondary | ICD-10-CM | POA: Diagnosis not present

## 2022-11-28 DIAGNOSIS — H18593 Other hereditary corneal dystrophies, bilateral: Secondary | ICD-10-CM | POA: Diagnosis not present

## 2022-11-28 DIAGNOSIS — H524 Presbyopia: Secondary | ICD-10-CM | POA: Diagnosis not present

## 2022-11-28 DIAGNOSIS — H43813 Vitreous degeneration, bilateral: Secondary | ICD-10-CM | POA: Diagnosis not present

## 2022-11-28 DIAGNOSIS — H401132 Primary open-angle glaucoma, bilateral, moderate stage: Secondary | ICD-10-CM | POA: Diagnosis not present

## 2022-11-28 DIAGNOSIS — H04123 Dry eye syndrome of bilateral lacrimal glands: Secondary | ICD-10-CM | POA: Diagnosis not present

## 2022-11-28 DIAGNOSIS — H52203 Unspecified astigmatism, bilateral: Secondary | ICD-10-CM | POA: Diagnosis not present

## 2022-11-28 DIAGNOSIS — E119 Type 2 diabetes mellitus without complications: Secondary | ICD-10-CM | POA: Diagnosis not present

## 2022-12-13 DIAGNOSIS — Z1331 Encounter for screening for depression: Secondary | ICD-10-CM | POA: Diagnosis not present

## 2022-12-13 DIAGNOSIS — E538 Deficiency of other specified B group vitamins: Secondary | ICD-10-CM | POA: Diagnosis not present

## 2022-12-13 DIAGNOSIS — Z0000 Encounter for general adult medical examination without abnormal findings: Secondary | ICD-10-CM | POA: Diagnosis not present

## 2022-12-13 DIAGNOSIS — E781 Pure hyperglyceridemia: Secondary | ICD-10-CM | POA: Diagnosis not present

## 2022-12-13 DIAGNOSIS — N183 Chronic kidney disease, stage 3 unspecified: Secondary | ICD-10-CM | POA: Diagnosis not present

## 2022-12-13 DIAGNOSIS — E1169 Type 2 diabetes mellitus with other specified complication: Secondary | ICD-10-CM | POA: Diagnosis not present

## 2022-12-13 DIAGNOSIS — I10 Essential (primary) hypertension: Secondary | ICD-10-CM | POA: Diagnosis not present

## 2022-12-13 DIAGNOSIS — D510 Vitamin B12 deficiency anemia due to intrinsic factor deficiency: Secondary | ICD-10-CM | POA: Diagnosis not present

## 2022-12-13 DIAGNOSIS — E1149 Type 2 diabetes mellitus with other diabetic neurological complication: Secondary | ICD-10-CM | POA: Diagnosis not present

## 2022-12-13 DIAGNOSIS — I4891 Unspecified atrial fibrillation: Secondary | ICD-10-CM | POA: Diagnosis not present

## 2022-12-13 DIAGNOSIS — Z23 Encounter for immunization: Secondary | ICD-10-CM | POA: Diagnosis not present

## 2022-12-13 DIAGNOSIS — G629 Polyneuropathy, unspecified: Secondary | ICD-10-CM | POA: Diagnosis not present

## 2022-12-13 LAB — LAB REPORT - SCANNED
A1c: 6.3
EGFR: 52

## 2022-12-27 DIAGNOSIS — E538 Deficiency of other specified B group vitamins: Secondary | ICD-10-CM | POA: Diagnosis not present

## 2023-01-07 ENCOUNTER — Other Ambulatory Visit: Payer: Self-pay | Admitting: Cardiology

## 2023-01-07 DIAGNOSIS — I4821 Permanent atrial fibrillation: Secondary | ICD-10-CM

## 2023-01-08 NOTE — Telephone Encounter (Signed)
Prescription refill request for Eliquis received. Indication: Afib  Last office visit: 05/30/22 Mayford Knife) Scr: 1.32 (12/13/22) Age: 89 Weight: 91.2kg   Appropriate dose. Refill sent.

## 2023-02-07 DIAGNOSIS — E538 Deficiency of other specified B group vitamins: Secondary | ICD-10-CM | POA: Diagnosis not present

## 2023-02-16 ENCOUNTER — Other Ambulatory Visit: Payer: Self-pay | Admitting: Cardiology

## 2023-03-21 DIAGNOSIS — D510 Vitamin B12 deficiency anemia due to intrinsic factor deficiency: Secondary | ICD-10-CM | POA: Diagnosis not present

## 2023-04-01 ENCOUNTER — Other Ambulatory Visit: Payer: Self-pay | Admitting: Cardiology

## 2023-04-01 DIAGNOSIS — I5032 Chronic diastolic (congestive) heart failure: Secondary | ICD-10-CM

## 2023-04-03 MED ORDER — EMPAGLIFLOZIN 10 MG PO TABS: 10.0000 mg | ORAL_TABLET | Freq: Every day | ORAL | 1 refills | Status: DC

## 2023-05-03 ENCOUNTER — Other Ambulatory Visit: Payer: Self-pay | Admitting: Cardiology

## 2023-05-03 DIAGNOSIS — D510 Vitamin B12 deficiency anemia due to intrinsic factor deficiency: Secondary | ICD-10-CM | POA: Diagnosis not present

## 2023-05-03 DIAGNOSIS — G4733 Obstructive sleep apnea (adult) (pediatric): Secondary | ICD-10-CM | POA: Diagnosis not present

## 2023-05-03 DIAGNOSIS — E1140 Type 2 diabetes mellitus with diabetic neuropathy, unspecified: Secondary | ICD-10-CM | POA: Diagnosis not present

## 2023-05-03 DIAGNOSIS — M1990 Unspecified osteoarthritis, unspecified site: Secondary | ICD-10-CM | POA: Diagnosis not present

## 2023-05-28 ENCOUNTER — Telehealth: Payer: Self-pay

## 2023-05-28 ENCOUNTER — Telehealth: Payer: Self-pay | Admitting: Cardiology

## 2023-05-28 ENCOUNTER — Other Ambulatory Visit (HOSPITAL_COMMUNITY): Payer: Self-pay

## 2023-05-28 NOTE — Telephone Encounter (Signed)
Pt called in about his prior auth. He is calling to get ahead of things, he got a notice from his insurance that his prior auth for Jardiance will expire 07/13/23.

## 2023-05-28 NOTE — Telephone Encounter (Signed)
   P/A is too soon to be initiated. See test claim below.      P/A response:

## 2023-05-29 NOTE — Telephone Encounter (Signed)
Tipps, Cameron [name], CPhT  I have ran a test claim and Have initiated a P/A. However it is too soon to submit a Prior Auth for Jardiance. We can attempt to resubmit within a month or less from when the original Prior Auth expires which is on 10/11.  Patient made aware it is too soon to do prior auth but office would follow up on this closer to expiration date.  Patient reports he has over 90 days of medication left

## 2023-06-12 ENCOUNTER — Ambulatory Visit: Payer: Medicare Other | Admitting: Cardiology

## 2023-06-13 DIAGNOSIS — H04123 Dry eye syndrome of bilateral lacrimal glands: Secondary | ICD-10-CM | POA: Diagnosis not present

## 2023-06-13 DIAGNOSIS — H401132 Primary open-angle glaucoma, bilateral, moderate stage: Secondary | ICD-10-CM | POA: Diagnosis not present

## 2023-06-13 DIAGNOSIS — H18593 Other hereditary corneal dystrophies, bilateral: Secondary | ICD-10-CM | POA: Diagnosis not present

## 2023-06-14 ENCOUNTER — Telehealth: Payer: Self-pay | Admitting: Pharmacy Technician

## 2023-06-14 ENCOUNTER — Ambulatory Visit: Payer: Medicare Other | Attending: Cardiology | Admitting: Cardiology

## 2023-06-14 ENCOUNTER — Other Ambulatory Visit (HOSPITAL_COMMUNITY): Payer: Self-pay

## 2023-06-14 ENCOUNTER — Encounter: Payer: Self-pay | Admitting: Cardiology

## 2023-06-14 VITALS — BP 156/74 | HR 94 | Ht 68.0 in | Wt 170.3 lb

## 2023-06-14 DIAGNOSIS — I5032 Chronic diastolic (congestive) heart failure: Secondary | ICD-10-CM | POA: Diagnosis not present

## 2023-06-14 DIAGNOSIS — E538 Deficiency of other specified B group vitamins: Secondary | ICD-10-CM | POA: Diagnosis not present

## 2023-06-14 DIAGNOSIS — I4821 Permanent atrial fibrillation: Secondary | ICD-10-CM | POA: Diagnosis not present

## 2023-06-14 DIAGNOSIS — G4733 Obstructive sleep apnea (adult) (pediatric): Secondary | ICD-10-CM | POA: Insufficient documentation

## 2023-06-14 DIAGNOSIS — I10 Essential (primary) hypertension: Secondary | ICD-10-CM | POA: Insufficient documentation

## 2023-06-14 DIAGNOSIS — Z79899 Other long term (current) drug therapy: Secondary | ICD-10-CM | POA: Diagnosis not present

## 2023-06-14 MED ORDER — METOPROLOL SUCCINATE ER 50 MG PO TB24: 50.0000 mg | ORAL_TABLET | Freq: Every day | ORAL | 3 refills | Status: DC

## 2023-06-14 MED ORDER — EMPAGLIFLOZIN 10 MG PO TABS: 10.0000 mg | ORAL_TABLET | Freq: Every day | ORAL | 3 refills | Status: DC

## 2023-06-14 MED ORDER — APIXABAN 5 MG PO TABS: 5.0000 mg | ORAL_TABLET | Freq: Two times a day (BID) | ORAL | 3 refills | Status: DC

## 2023-06-14 MED ORDER — FUROSEMIDE 20 MG PO TABS: 20.0000 mg | ORAL_TABLET | ORAL | 11 refills | Status: DC | PRN

## 2023-06-14 MED ORDER — POTASSIUM CHLORIDE CRYS ER 20 MEQ PO TBCR: 20.0000 meq | EXTENDED_RELEASE_TABLET | ORAL | 3 refills | Status: DC | PRN

## 2023-06-14 NOTE — Telephone Encounter (Signed)
Pharmacy Patient Advocate Encounter  Insurance verification completed.    The patient is insured through [facility]   Ran test claim for jardiance. Currently prior auth rejection says the refill is too soon and prior Berkley Harvey is not needed at this time.   This test claim was processed through [facility]- copay amounts may vary at other pharmacies due to pharmacy/plan contracts, or as the patient moves through the different stages of their insurance plan.

## 2023-06-14 NOTE — Telephone Encounter (Signed)
Pharmacy Patient Advocate Encounter   Received notification from Pt Calls Messages that prior authorization for jardiance is required/requested.   Insurance verification completed.   The patient is insured through [facility] .   Per test claim: PA required; PA started via CoverMyMeds. KEY [id number] . Waiting for clinical questions to populate.

## 2023-06-14 NOTE — Addendum Note (Signed)
Addended by: Luellen Pucker on: 06/14/2023 12:16 PM   Modules accepted: Orders

## 2023-06-14 NOTE — Progress Notes (Signed)
Cardiology Office Note:    Date:  06/14/2023   ID:  Cameron [name], DOB 02-24-1934, MRN [id number]  PCP:  Noni Saupe, MD  Cardiologist:  None    Referring MD: Noni Saupe, MD   Chief Complaint  Patient presents with   Sleep Apnea   Atrial Fibrillation   Hypertension   Congestive Heart Failure    History of Present Illness:    Cameron [name] is a 89 y.o. male with a hx of permanent atrial fibrillation, chronic diastolic CHF, asymptomatic bradycardia, systemic anticoagulation and OSA on CPAP.  He has moderate OSA with an AHI of 17.53/hr and is on CPAP at 7cm H2O.   He is here today for followup and is doing well.  He denies any chest pain or pressure, SOB, DOE, PND, orthopnea, dizziness, palpitations or syncope. He has chronic LE edema that is intermittent and resolves with Lasix that he takes as needed.  He is compliant with his meds and is tolerating meds with no SE.    He is doing well with his PAP device and thinks that he has gotten used to it.  He tolerates the nasal pillow mask and feels the pressure is adequate.  Since going on PAP he feels rested in the am and has no significant daytime sleepiness but occasionally will take a nap if he sits down in the chair.  He denies any significant mouth or nasal dryness or nasal congestion.  He does not think that he snores.    Past Medical History:  Diagnosis Date   Acute cholecystitis 09/02/2014   Anemia    hx of   Arthritis    Borderline diabetes    borderline   Chronic anticoagulation    Chronic diastolic (congestive) heart failure (HCC)    CKD (chronic kidney disease), stage II    GERD (gastroesophageal reflux disease)    Gout    in 70's, "not in toe but everywhere else"   Hypertension    IFG (impaired fasting glucose) 09/05/2014   Low back pain    when lying flat   Neuromuscular disorder (HCC)    OSA (obstructive sleep apnea)    moderate with AHI 17.53/hr now on CPAP at 7cm H2O   Peripheral neuropathy     both feet, can feel pain   Permanent atrial fibrillation (HCC)    Rosacea    Swelling    both legs sometimes   Vitamin B12 deficiency     Past Surgical History:  Procedure Laterality Date   APPENDECTOMY     1997   CARDIOVERSION     at least 5 times   CARDIOVERSION N/A 10/24/2013   Procedure: CARDIOVERSION;  Surgeon: Quintella Reichert, MD;  Location: [facility];  Service: Cardiovascular;  Laterality: N/A;   CATARACT EXTRACTION Bilateral    CHOLECYSTECTOMY N/A 11/16/2014   Procedure: ATTEMPTED LAPAROSCOPIC CHOLECYSTECTOMY TO OPEN CHOLECYSTECTOMY WITH LYSIS OF ADHESIONS - ;  Surgeon: Darnell Level, MD;  Location[facility];  Service: General;  Laterality: N/A;   COLON SURGERY     EYE SURGERY     HEMICOLECTOMY  03/1996   with 3 lymph nodes   HERNIA REPAIR     INGUINAL HERNIA REPAIR Left 08/21/2013   Procedure:  LEFT INGUINAL HERNIA REPAIR WITH MESH;  Surgeon: Velora Heckler, MD;  Location: [facility] ORS;  Service: General;  Laterality: Left;   INGUINAL HERNIA REPAIR Right 01/29/2014   Procedure:  REPAIR RIGHT INGUNIAL HERNIA WITH MESH;  Surgeon: Velora Heckler, MD;  Location: [facility] ORS;  Service: General;  Laterality: Right;   INSERTION OF MESH Left 08/21/2013   Procedure: INSERTION OF MESH;  Surgeon: Velora Heckler, MD;  Location: [facility] ORS;  Service: General;  Laterality: Left;   TUMOR REMOVAL Right 1961   back of leg    Current Medications: Current Meds  Medication Sig   acyclovir ointment (ZOVIRAX) 5 % 1 application every 3 hours Externally Six times a day if needed for cold sores   bimatoprost (LUMIGAN) 0.01 % SOLN Place 1 drop into both eyes at bedtime.   cyanocobalamin (,VITAMIN B-12,) 1000 MCG/ML injection Inject 1,000 mcg into the muscle every 6 (six) weeks.   doxazosin (CARDURA) 4 MG tablet TAKE 1 TABLET(4 MG) BY MOUTH DAILY   ELIQUIS 5 MG TABS tablet TAKE 1 TABLET TWICE A DAY   empagliflozin (JARDIANCE) 10 MG TABS tablet Take 1 tablet (10 mg total) by mouth daily before breakfast.    folic acid (FOLVITE) 1 MG tablet Take 1 mg by mouth daily.   furosemide (LASIX) 20 MG tablet Take 1 tablet (20 mg total) by mouth as needed for edema (or weigh gain of 3lbs or more in one day).   meloxicam (MOBIC) 15 MG tablet 1 tablet Orally once a day with food for pain and inflammation as needed   metoprolol succinate (TOPROL-XL) 50 MG 24 hr tablet TAKE 1 TABLET(50 MG) BY MOUTH DAILY WITH OR IMMEDIATELY FOLLOWING A MEAL   potassium chloride SA (KLOR-CON M) 20 MEQ tablet Take 1 tablet (20 mEq total) by mouth daily.   traMADol (ULTRAM) 50 MG tablet Take 50 mg by mouth every 4 (four) hours as needed for moderate pain or severe pain. Maximum dose= 8 tablets per day     Allergies:   Ace inhibitors and Clonidine derivatives   Social History   Socioeconomic History   Marital status: Widowed    Spouse name: Not on file   Number of children: Not on file   Years of education: Not on file   Highest education level: Not on file  Occupational History   Occupation: Retired   Tobacco Use   Smoking status: Former    Current packs/day: 0.00    Average packs/day: 2.0 packs/day for 20.0 years (40.0 ttl pk-yrs)    Types: Cigarettes    Start date: 10/02/1954    Quit date: 10/02/1974    Years since quitting: 48.7   Smokeless tobacco: Never  Vaping Use   Vaping status: Never Used  Substance and Sexual Activity   Alcohol use: Yes    Alcohol/week: 0.0 standard drinks of alcohol    Comment: occasionally   Drug use: No   Sexual activity: Not on file  Other Topics Concern   Not on file  Social History Narrative   Pt lives in [place] with spouse.   Retired in 1999 from the [employer].  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         Social Determinants of Health   Financial Resource Strain: Not on file  Food Insecurity: Not on file  Transportation Needs: Not on file  Physical Activity: Not on file  Stress: Not on file  Social  Connections: Not on file     Family History: The patient's family history includes Allergies in his daughter; Asthma in his daughter; Cancer in his father; Dementia in his sister; Stroke (age of onset: 49) in his mother.  ROS:  Please see the history of present illness.    ROS  All other systems reviewed and negative.   EKGs/Labs/Other Studies Reviewed:    The following studies were reviewed today: PAP compliance download from [facility], outside labs from [facility]  Recent Labs: 09/05/2022: BUN 21; Creatinine, Ser 1.49; Potassium 4.1; Sodium 140   Recent Lipid Panel No results found for: "CHOL", "TRIG", "HDL", "CHOLHDL", "VLDL", "LDLCALC", "LDLDIRECT"     Physical Exam:    VS:  BP (!) 156/74   Pulse 94   Ht 5\' 8"  (1.727 m)   Wt 170 lb 4.8 oz (77.2 kg)   SpO2 96%   BMI 25.89 kg/m     Wt Readings from Last 3 Encounters:  06/14/23 170 lb 4.8 oz (77.2 kg)  07/05/22 201 lb (91.2 kg)  05/30/22 204 lb 12.8 oz (92.9 kg)    GEN: Well nourished, well developed in no acute distress HEENT: Normal NECK: No JVD; No carotid bruits LYMPHATICS: No lymphadenopathy CARDIAC: Irregularly irregular, no murmurs, rubs, gallops RESPIRATORY:  Clear to auscultation without rales, wheezing or rhonchi  ABDOMEN: Soft, non-tender, non-distended MUSCULOSKELETAL:  No edema; No deformity  SKIN: Warm and dry NEUROLOGIC:  Alert and oriented x 3 PSYCHIATRIC:  Normal affect  ASSESSMENT:    1. OSA (obstructive sleep apnea)   2. Permanent atrial fibrillation (HCC)   3. Primary hypertension   4. Chronic diastolic heart failure (HCC)     PLAN:    In order of problems listed above:  1. OSA - The patient is tolerating PAP therapy well without any problems. The PAP download performed by his DME was personally reviewed and interpreted by me today and showed an AHI of 44 /hr on 7 cm H2O with 67 % compliance in using more than 4 hours nightly.  The patient has been using and benefiting from PAP use and will  continue to benefit from therapy.  -I have recommended that he use a chin strap as I suspect that he is leaking air out of his mouth at night from the nasal pillow mask>>I will get a download in 4 weeks and if the AHI is still elevated then will send him back to the sleep lab for repeat titration -He also is sleeping supine so I told him to try to get off his back  2.  Permanent atrial fibrillation -He remains in atrial fibrillation with adequate heart rate control and denies any palpitations -Denies any bleed issues on DOAC -Continue prescription drug management with Eliquis 5 mg twice daily and Toprol XL 50 mg daily with as needed refills -I have personally reviewed and interpreted outside labs performed by patient's PCP which showed serum creatinine 1.49 and potassium 4.1 on 09/05/2022 and hemoglobin 13.7 on 02/04/2022 -Will repeat bmet and CBC today.  If his serum creatinine is >1.5 then we will need to reduce the dose of his apixaban to 2.5 mg twice daily as his age is greater than 80  3.  HTN -BP adequately  controlled on exam today -Continue prescription drug management with Toprol-XL 50 mg daily and Doxazosin 4 mg daily with as needed refills  4.  Chronic diastolic CHF -He appears euvolemic on exam today -Continue prescription drug management with Lasix 20 mg as needed for weight gain greater than 3 pounds in a day or 5 pounds in a week and lower extremity edema -continue Jardiance 10mg  daily and Toprol XL 50mg  daily with PRN refills   Followup with me in 1 year   Medication  Adjustments/Labs and Tests Ordered: Current medicines are reviewed at length with the patient today.  Concerns regarding medicines are outlined above.  No orders of the defined types were placed in this encounter.  No orders of the defined types were placed in this encounter.   Signed, Armanda Magic, MD  06/14/2023 11:56 AM    [facility]

## 2023-06-14 NOTE — Patient Instructions (Signed)
Medication Instructions:  Take 20 MEq of potassium anytime you take a PRN dose of lasix.   *If you need a refill on your cardiac medications before your next appointment, please call your pharmacy*   Lab Work: Please complete a CBC and a BMET in our lab before you leave our office today.   If you have labs (blood work) drawn today and your tests are completely normal, you will receive your results only by: MyChart Message (if you have MyChart) OR A paper copy in the mail If you have any lab test that is abnormal or we need to change your treatment, we will call you to review the results.   Testing/Procedures: None.   Follow-Up:  Your next appointment:   1 year(s)  Provider:   Dr. Armanda Magic, MD   Other Instructions Dr. Mayford Knife will get a download from your cpap machine in 4 weeks.

## 2023-06-14 NOTE — Telephone Encounter (Signed)
PA request has been Submitted. New Encounter created for follow up. For additional info see Pharmacy Prior Auth telephone encounter from 06/14/23.

## 2023-06-15 LAB — CBC WITH DIFFERENTIAL/PLATELET
Basophils Absolute: 0 10*3/uL (ref 0.0–0.2)
Basos: 1 %
EOS (ABSOLUTE): 0.1 10*3/uL (ref 0.0–0.4)
Eos: 2 %
Hematocrit: 41.6 % (ref 37.5–51.0)
Hemoglobin: 13.4 g/dL (ref 13.0–17.7)
Lymphocytes Absolute: 1.3 10*3/uL (ref 0.7–3.1)
Lymphs: 19 %
MCH: 27.3 pg (ref 26.6–33.0)
MCHC: 32.2 g/dL (ref 31.5–35.7)
MCV: 85 fL (ref 79–97)
Monocytes Absolute: 0.5 10*3/uL (ref 0.1–0.9)
Monocytes: 8 %
Neutrophils Absolute: 4.7 10*3/uL (ref 1.4–7.0)
Neutrophils: 70 %
Platelets: 150 10*3/uL (ref 150–450)
RBC: 4.9 x10E6/uL (ref 4.14–5.80)
RDW: 17 % — ABNORMAL HIGH (ref 11.6–15.4)
WBC: 6.7 10*3/uL (ref 3.4–10.8)

## 2023-06-15 LAB — BASIC METABOLIC PANEL
BUN/Creatinine Ratio: 15 (ref 10–24)
BUN: 18 mg/dL (ref 8–27)
CO2: 24 mmol/L (ref 20–29)
Calcium: 9.8 mg/dL (ref 8.6–10.2)
Chloride: 105 mmol/L (ref 96–106)
Creatinine, Ser: 1.23 mg/dL (ref 0.76–1.27)
Glucose: 115 mg/dL — ABNORMAL HIGH (ref 70–99)
Potassium: 4.1 mmol/L (ref 3.5–5.2)
Sodium: 140 mmol/L (ref 134–144)
eGFR: 57 mL/min/{1.73_m2} — ABNORMAL LOW (ref >59)

## 2023-06-22 ENCOUNTER — Telehealth: Payer: Self-pay

## 2023-06-22 NOTE — Telephone Encounter (Signed)
Patient is returning call.

## 2023-06-22 NOTE — Telephone Encounter (Signed)
Attempted to call patient, no answer. Let message with no identifiers asking recipient to call our office.

## 2023-06-22 NOTE — Telephone Encounter (Signed)
-----   Message from Armanda Magic sent at 06/18/2023 11:49 PM EDT ----- Stable labs - continue current meds and forward to PCP

## 2023-06-25 ENCOUNTER — Telehealth: Payer: Self-pay | Admitting: Cardiology

## 2023-06-25 NOTE — Telephone Encounter (Signed)
Addressed in other telephone encounter today.

## 2023-06-25 NOTE — Telephone Encounter (Signed)
The patient has been notified of the result and verbalized understanding.  All questions (if any) were answered. Lendon Ka, RN 06/25/2023 2:56 PM   Lab results from Dr. Mayford Knife.  Forwarded to PCP.

## 2023-06-25 NOTE — Telephone Encounter (Signed)
Patient is returning call.  °

## 2023-07-06 ENCOUNTER — Other Ambulatory Visit (HOSPITAL_COMMUNITY): Payer: Self-pay

## 2023-07-26 DIAGNOSIS — E538 Deficiency of other specified B group vitamins: Secondary | ICD-10-CM | POA: Diagnosis not present

## 2023-08-06 ENCOUNTER — Other Ambulatory Visit: Payer: Self-pay | Admitting: Cardiology

## 2023-08-20 ENCOUNTER — Ambulatory Visit: Payer: Medicare Other | Admitting: Cardiology

## 2023-09-06 DIAGNOSIS — E538 Deficiency of other specified B group vitamins: Secondary | ICD-10-CM | POA: Diagnosis not present

## 2023-09-19 ENCOUNTER — Other Ambulatory Visit (HOSPITAL_COMMUNITY): Payer: Self-pay

## 2023-09-19 ENCOUNTER — Telehealth: Payer: Self-pay | Admitting: Pharmacy Technician

## 2023-09-19 NOTE — Telephone Encounter (Signed)
Pharmacy Patient Advocate Encounter   Received notification from CoverMyMeds that prior authorization for jardiance is required/requested.   Insurance verification completed.   The patient is insured through [facility] .   Per test claim: PA required; PA submitted to above mentioned insurance via CoverMyMeds Key/confirmation #/EOC [id number] Status is pending

## 2023-09-19 NOTE — Telephone Encounter (Signed)
Pharmacy Patient Advocate Encounter  Received notification from [facility] that Prior Authorization for jardiance has been APPROVED from 09/19/23 to 09/18/24   PA #/Case ID/Reference #: [id number]

## 2023-10-18 DIAGNOSIS — E538 Deficiency of other specified B group vitamins: Secondary | ICD-10-CM | POA: Diagnosis not present

## 2023-11-29 DIAGNOSIS — E538 Deficiency of other specified B group vitamins: Secondary | ICD-10-CM | POA: Diagnosis not present

## 2023-12-20 DIAGNOSIS — Z6829 Body mass index (BMI) 29.0-29.9, adult: Secondary | ICD-10-CM | POA: Diagnosis not present

## 2023-12-20 DIAGNOSIS — D510 Vitamin B12 deficiency anemia due to intrinsic factor deficiency: Secondary | ICD-10-CM | POA: Diagnosis not present

## 2023-12-20 DIAGNOSIS — E1169 Type 2 diabetes mellitus with other specified complication: Secondary | ICD-10-CM | POA: Diagnosis not present

## 2023-12-20 DIAGNOSIS — I10 Essential (primary) hypertension: Secondary | ICD-10-CM | POA: Diagnosis not present

## 2023-12-20 DIAGNOSIS — G629 Polyneuropathy, unspecified: Secondary | ICD-10-CM | POA: Diagnosis not present

## 2023-12-20 DIAGNOSIS — E1140 Type 2 diabetes mellitus with diabetic neuropathy, unspecified: Secondary | ICD-10-CM | POA: Diagnosis not present

## 2023-12-20 DIAGNOSIS — Z0000 Encounter for general adult medical examination without abnormal findings: Secondary | ICD-10-CM | POA: Diagnosis not present

## 2023-12-20 DIAGNOSIS — I4891 Unspecified atrial fibrillation: Secondary | ICD-10-CM | POA: Diagnosis not present

## 2023-12-20 DIAGNOSIS — N183 Chronic kidney disease, stage 3 unspecified: Secondary | ICD-10-CM | POA: Diagnosis not present

## 2023-12-26 DIAGNOSIS — H0100A Unspecified blepharitis right eye, upper and lower eyelids: Secondary | ICD-10-CM | POA: Diagnosis not present

## 2023-12-26 DIAGNOSIS — H43813 Vitreous degeneration, bilateral: Secondary | ICD-10-CM | POA: Diagnosis not present

## 2023-12-26 DIAGNOSIS — H18593 Other hereditary corneal dystrophies, bilateral: Secondary | ICD-10-CM | POA: Diagnosis not present

## 2023-12-26 DIAGNOSIS — H401132 Primary open-angle glaucoma, bilateral, moderate stage: Secondary | ICD-10-CM | POA: Diagnosis not present

## 2023-12-26 DIAGNOSIS — E119 Type 2 diabetes mellitus without complications: Secondary | ICD-10-CM | POA: Diagnosis not present

## 2023-12-26 DIAGNOSIS — H0100B Unspecified blepharitis left eye, upper and lower eyelids: Secondary | ICD-10-CM | POA: Diagnosis not present

## 2023-12-26 DIAGNOSIS — H04123 Dry eye syndrome of bilateral lacrimal glands: Secondary | ICD-10-CM | POA: Diagnosis not present

## 2024-01-17 DIAGNOSIS — E538 Deficiency of other specified B group vitamins: Secondary | ICD-10-CM | POA: Diagnosis not present

## 2024-02-29 DIAGNOSIS — E538 Deficiency of other specified B group vitamins: Secondary | ICD-10-CM | POA: Diagnosis not present

## 2024-03-19 DIAGNOSIS — H401131 Primary open-angle glaucoma, bilateral, mild stage: Secondary | ICD-10-CM | POA: Diagnosis not present

## 2024-03-19 DIAGNOSIS — H1849 Other corneal degeneration: Secondary | ICD-10-CM | POA: Diagnosis not present

## 2024-03-19 DIAGNOSIS — H18523 Epithelial (juvenile) corneal dystrophy, bilateral: Secondary | ICD-10-CM | POA: Diagnosis not present

## 2024-03-19 DIAGNOSIS — E859 Amyloidosis, unspecified: Secondary | ICD-10-CM | POA: Diagnosis not present

## 2024-03-19 DIAGNOSIS — Z961 Presence of intraocular lens: Secondary | ICD-10-CM | POA: Diagnosis not present

## 2024-04-11 DIAGNOSIS — E538 Deficiency of other specified B group vitamins: Secondary | ICD-10-CM | POA: Diagnosis not present

## 2024-05-09 ENCOUNTER — Telehealth: Payer: Self-pay | Admitting: Cardiology

## 2024-05-09 ENCOUNTER — Telehealth: Payer: Self-pay | Admitting: Pharmacy Technician

## 2024-05-09 ENCOUNTER — Other Ambulatory Visit (HOSPITAL_COMMUNITY): Payer: Self-pay

## 2024-05-09 DIAGNOSIS — I5032 Chronic diastolic (congestive) heart failure: Secondary | ICD-10-CM

## 2024-05-09 MED ORDER — EMPAGLIFLOZIN 10 MG PO TABS: 10.0000 mg | ORAL_TABLET | Freq: Every day | ORAL | 1 refills | Status: AC

## 2024-05-09 NOTE — Telephone Encounter (Signed)
 From pt calls [employer]  This does not need a prior auth. He gets from his mailorder. I called him and he read me the email from [facility] and it is he is out of refills. I sent message for them to send refills

## 2024-05-09 NOTE — Telephone Encounter (Signed)
 Sent in refills for patient to have until his appointment with Dr. Shlomo.

## 2024-05-09 NOTE — Telephone Encounter (Signed)
 Pt c/o medication issue:  1. Name of Medication:   empagliflozin  (JARDIANCE ) 10 MG TABS tablet    2. How are you currently taking this medication (dosage and times per day)?   Take 1 tablet (10 mg total) by mouth daily before breakfast.    3. Are you having a reaction (difficulty breathing--STAT)? No  4. What is your medication issue? Needs Prior Authorization

## 2024-05-23 DIAGNOSIS — Z961 Presence of intraocular lens: Secondary | ICD-10-CM | POA: Diagnosis not present

## 2024-05-23 DIAGNOSIS — H401131 Primary open-angle glaucoma, bilateral, mild stage: Secondary | ICD-10-CM | POA: Diagnosis not present

## 2024-05-23 DIAGNOSIS — E859 Amyloidosis, unspecified: Secondary | ICD-10-CM | POA: Diagnosis not present

## 2024-05-23 DIAGNOSIS — E538 Deficiency of other specified B group vitamins: Secondary | ICD-10-CM | POA: Diagnosis not present

## 2024-05-23 DIAGNOSIS — H1849 Other corneal degeneration: Secondary | ICD-10-CM | POA: Diagnosis not present

## 2024-05-23 DIAGNOSIS — H18523 Epithelial (juvenile) corneal dystrophy, bilateral: Secondary | ICD-10-CM | POA: Diagnosis not present

## 2024-06-03 ENCOUNTER — Other Ambulatory Visit: Payer: Self-pay | Admitting: Cardiology

## 2024-06-03 DIAGNOSIS — I4821 Permanent atrial fibrillation: Secondary | ICD-10-CM

## 2024-06-03 NOTE — Telephone Encounter (Signed)
 Prescription refill request for Eliquis  received. Indication:afib Last office visit:9/24 Scr:1.23  9/24 Age: 89 Weight:77.2  kg  Prescription refilled

## 2024-06-26 DIAGNOSIS — H52203 Unspecified astigmatism, bilateral: Secondary | ICD-10-CM | POA: Diagnosis not present

## 2024-06-26 DIAGNOSIS — H18593 Other hereditary corneal dystrophies, bilateral: Secondary | ICD-10-CM | POA: Diagnosis not present

## 2024-06-26 DIAGNOSIS — H401132 Primary open-angle glaucoma, bilateral, moderate stage: Secondary | ICD-10-CM | POA: Diagnosis not present

## 2024-06-26 DIAGNOSIS — H524 Presbyopia: Secondary | ICD-10-CM | POA: Diagnosis not present

## 2024-07-04 DIAGNOSIS — E538 Deficiency of other specified B group vitamins: Secondary | ICD-10-CM | POA: Diagnosis not present

## 2024-07-15 ENCOUNTER — Other Ambulatory Visit (HOSPITAL_COMMUNITY): Payer: Self-pay

## 2024-07-15 ENCOUNTER — Telehealth: Payer: Self-pay | Admitting: Pharmacy Technician

## 2024-07-15 NOTE — Telephone Encounter (Signed)
   Pharmacy Patient Advocate Encounter   Received notification from CoverMyMeds that prior authorization for jardiance  is required/requested.   Insurance verification completed.   The patient is insured through [facility].   Per test claim: PA required; PA submitted to above mentioned insurance via Latent Key/confirmation #/EOC [id number] Status is pending   Pharmacy Patient Advocate Encounter  Received notification from [facility] that Prior Authorization for jardiance  has been APPROVED from 07/15/24 to 07/15/25   PA #/Case ID/Reference #: [id number]

## 2024-08-04 ENCOUNTER — Other Ambulatory Visit: Payer: Self-pay | Admitting: Cardiology

## 2024-08-05 ENCOUNTER — Telehealth: Payer: Self-pay

## 2024-08-05 NOTE — Telephone Encounter (Signed)
 SABRA

## 2024-08-06 ENCOUNTER — Encounter: Payer: Self-pay | Admitting: Cardiology

## 2024-08-06 ENCOUNTER — Telehealth: Payer: Self-pay | Admitting: *Deleted

## 2024-08-06 ENCOUNTER — Ambulatory Visit: Attending: Cardiology | Admitting: Cardiology

## 2024-08-06 VITALS — BP 136/74 | HR 73 | Ht 68.0 in | Wt 183.0 lb

## 2024-08-06 DIAGNOSIS — I5032 Chronic diastolic (congestive) heart failure: Secondary | ICD-10-CM | POA: Diagnosis not present

## 2024-08-06 DIAGNOSIS — I4821 Permanent atrial fibrillation: Secondary | ICD-10-CM

## 2024-08-06 DIAGNOSIS — Z79899 Other long term (current) drug therapy: Secondary | ICD-10-CM | POA: Diagnosis not present

## 2024-08-06 DIAGNOSIS — G4733 Obstructive sleep apnea (adult) (pediatric): Secondary | ICD-10-CM | POA: Diagnosis not present

## 2024-08-06 DIAGNOSIS — I10 Essential (primary) hypertension: Secondary | ICD-10-CM | POA: Insufficient documentation

## 2024-08-06 LAB — CBC WITH DIFFERENTIAL/PLATELET

## 2024-08-06 MED ORDER — APIXABAN 5 MG PO TABS: 5.0000 mg | ORAL_TABLET | Freq: Two times a day (BID) | ORAL | 3 refills | Status: AC

## 2024-08-06 MED ORDER — POTASSIUM CHLORIDE CRYS ER 20 MEQ PO TBCR: 20.0000 meq | EXTENDED_RELEASE_TABLET | ORAL | 3 refills | Status: AC | PRN

## 2024-08-06 MED ORDER — DOXAZOSIN MESYLATE 4 MG PO TABS: ORAL_TABLET | ORAL | 3 refills | Status: AC

## 2024-08-06 MED ORDER — FUROSEMIDE 20 MG PO TABS: 20.0000 mg | ORAL_TABLET | ORAL | 11 refills | Status: AC | PRN

## 2024-08-06 MED ORDER — METOPROLOL SUCCINATE ER 50 MG PO TB24: 50.0000 mg | ORAL_TABLET | Freq: Every day | ORAL | 3 refills | Status: AC

## 2024-08-06 NOTE — Addendum Note (Signed)
 Addended by: JANIT GENI CROME on: 08/06/2024 08:46 AM   Modules accepted: Orders

## 2024-08-06 NOTE — Patient Instructions (Signed)
 Medication Instructions:  Your physician recommends that you continue on your current medications as directed. Please refer to the Current Medication list given to you today.  *If you need a refill on your cardiac medications before your next appointment, please call your pharmacy*  Lab Work: Please complete a CBC and a BMET today in our first floor lab before you leave.  If you have labs (blood work) drawn today and your tests are completely normal, you will receive your results only by: MyChart Message (if you have MyChart) OR A paper copy in the mail If you have any lab test that is abnormal or we need to change your treatment, we will call you to review the results.  Testing/Procedures: None.  Follow-Up: At [facility], you and your health needs are our priority.  As part of our continuing mission to provide you with exceptional heart care, our providers are all part of one team.  This team includes your primary Cardiologist (physician) and Advanced Practice Providers or APPs (Physician Assistants and Nurse Practitioners) who all work together to provide you with the care you need, when you need it.  Your next appointment:   1 year(s)  Provider:   Dr. Wilbert Bihari, MD

## 2024-08-06 NOTE — Progress Notes (Signed)
 Cardiology Office Note:    Date:  08/06/2024   ID:  Cameron [name], DOB 1934-01-31, MRN [id number]  PCP:  [facility] [place]  Cardiologist:  None    Referring MD: Dottie Norleen PHEBE PONCE, MD   Chief Complaint  Patient presents with   Sleep Apnea   Atrial Fibrillation   Hypertension   Congestive Heart Failure    History of Present Illness:    Cameron [name] is a 89 y.o. male with a hx of permanent atrial fibrillation, chronic diastolic CHF, asymptomatic bradycardia, systemic anticoagulation and OSA on CPAP.  He has moderate OSA with an AHI of 17.53/hr and is on CPAP at 7cm H2O.   He is here today and is doing well.  He denies CP or pressure, SOB, DOE, LE edema, dizziness, palpitations or syncope.   He is doing well with his PAP device.SABRA  He tolerates the mask and feels the pressure is adequate.  Since going on PAP he feels rested in the am and has no significant daytime sleepiness.  He denies any significant mouth or nasal dryness or nasal congestion.   An Epworth Sleepiness Scale score was calculated the office today and this endorsed at 3 arguing against residual daytime sleepiness. Patient denies any episodes of bruxism, restless legs, No gagging hallucinations or cataplectic events.    Past Medical History:  Diagnosis Date   Acute cholecystitis 09/02/2014   Anemia    hx of   Arthritis    Borderline diabetes    borderline   Chronic anticoagulation    Chronic diastolic (congestive) heart failure (HCC)    CKD (chronic kidney disease), stage II    GERD (gastroesophageal reflux disease)    Gout    in 70's, not in toe but everywhere else   Hypertension    IFG (impaired fasting glucose) 09/05/2014   Low back pain    when lying flat   Neuromuscular disorder (HCC)    OSA (obstructive sleep apnea)    moderate with AHI 17.53/hr now on CPAP at 7cm H2O   Peripheral neuropathy    both feet, can feel pain   Permanent atrial fibrillation (HCC)    Rosacea     Swelling    both legs sometimes   Vitamin B12 deficiency     Past Surgical History:  Procedure Laterality Date   APPENDECTOMY     1997   CARDIOVERSION     at least 5 times   CARDIOVERSION N/A 10/24/2013   Procedure: CARDIOVERSION;  Surgeon: Wilbert JONELLE Bihari, MD;  Location: [facility];  Service: Cardiovascular;  Laterality: N/A;   CATARACT EXTRACTION Bilateral    CHOLECYSTECTOMY N/A 11/16/2014   Procedure: ATTEMPTED LAPAROSCOPIC CHOLECYSTECTOMY TO OPEN CHOLECYSTECTOMY WITH LYSIS OF ADHESIONS - ;  Surgeon: Krystal Spinner, MD;  Location: [facility] OR;  Service: General;  Laterality: N/A;   COLON SURGERY     EYE SURGERY     HEMICOLECTOMY  03/1996   with 3 lymph nodes   HERNIA REPAIR     INGUINAL HERNIA REPAIR Left 08/21/2013   Procedure:  LEFT INGUINAL HERNIA REPAIR WITH MESH;  Surgeon: Krystal CHRISTELLA Spinner, MD;  Location: [facility];  Service: General;  Laterality: Left;   INGUINAL HERNIA REPAIR Right 01/29/2014   Procedure:  REPAIR RIGHT INGUNIAL HERNIA WITH MESH;  Surgeon: Krystal CHRISTELLA Spinner, MD;  Location: [facility] OR[facility];  Service: General;  Laterality: Right;   INSERTION OF MESH Left 08/21/2013   Procedure: INSERTION OF MESH;  Surgeon: Krystal  CHRISTELLA Spinner, MD;  Location: [facility] OR[facility];  Service: General;  Laterality: Left;   TUMOR REMOVAL Right 1961   back of leg    Current Medications: Current Meds  Medication Sig   acyclovir ointment (ZOVIRAX) 5 % 1 application every 3 hours Externally Six times a day if needed for cold sores   bimatoprost  (LUMIGAN ) 0.01 % SOLN Place 1 drop into both eyes at bedtime.   cyanocobalamin  (,VITAMIN B-12,) 1000 MCG/ML injection Inject 1,000 mcg into the muscle every 6 (six) weeks.   doxazosin  (CARDURA ) 4 MG tablet TAKE 1 TABLET(4 MG) BY MOUTH DAILY   ELIQUIS  5 MG TABS tablet TAKE 1 TABLET TWICE A DAY   empagliflozin  (JARDIANCE ) 10 MG TABS tablet Take 1 tablet (10 mg total) by mouth daily before breakfast.   folic acid  (FOLVITE ) 1 MG tablet Take 1 mg by mouth daily.   furosemide  (LASIX )  20 MG tablet Take 1 tablet (20 mg total) by mouth as needed for edema (or weigh gain of 3lbs or more in one day).   meloxicam (MOBIC) 15 MG tablet 1 tablet Orally once a day with food for pain and inflammation as needed   metoprolol  succinate (TOPROL -XL) 50 MG 24 hr tablet Take 1 tablet (50 mg total) by mouth daily. Take with or immediately following a meal.   potassium chloride  SA (KLOR-CON  M) 20 MEQ tablet Take 1 tablet (20 mEq total) by mouth as needed. Take this medication anytime you take lasix .   traMADol  (ULTRAM ) 50 MG tablet Take 50 mg by mouth every 4 (four) hours as needed for moderate pain or severe pain. Maximum dose= 8 tablets per day     Allergies:   Ace inhibitors and Clonidine derivatives   Social History   Socioeconomic History   Marital status: Widowed    Spouse name: Not on file   Number of children: Not on file   Years of education: Not on file   Highest education level: Not on file  Occupational History   Occupation: Retired   Tobacco Use   Smoking status: Former    Current packs/day: 0.00    Average packs/day: 2.0 packs/day for 20.0 years (40.0 ttl pk-yrs)    Types: Cigarettes    Start date: 10/02/1954    Quit date: 10/02/1974    Years since quitting: 49.8   Smokeless tobacco: Never  Vaping Use   Vaping status: Never Used  Substance and Sexual Activity   Alcohol use: Yes    Alcohol/week: 0.0 standard drinks of alcohol    Comment: occasionally   Drug use: No   Sexual activity: Not on file  Other Topics Concern   Not on file  Social History Narrative   Pt lives in [place] with spouse.   Retired in 1999 from the [employer].  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         Social Drivers of Corporate Investment Banker Strain: Not on file  Food Insecurity: Not on file  Transportation Needs: Not on file  Physical Activity: Not on file  Stress: Not on file  Social Connections: Not on file      Family History: The patient's family history includes Allergies in his daughter; Asthma in his daughter; Cancer in his father; Dementia in his sister; Stroke (age of onset: 66) in his mother.  ROS:   Please see the history of present illness.    ROS  All other systems reviewed and negative.  EKGs/Labs/Other Studies Reviewed:    The following studies were reviewed today: PAP compliance download from [facility], outside labs from [facility]  Recent Labs: No results found for requested labs within last 365 days.   Recent Lipid Panel No results found for: CHOL, TRIG, HDL, CHOLHDL, VLDL, LDLCALC, LDLDIRECT     Physical Exam:    VS:  BP 136/74   Pulse 73   Ht 5' 8 (1.727 m)   Wt 183 lb (83 kg)   SpO2 98%   BMI 27.83 kg/m     Wt Readings from Last 3 Encounters:  08/06/24 183 lb (83 kg)  06/14/23 170 lb 4.8 oz (77.2 kg)  07/05/22 201 lb (91.2 kg)  GEN: Well nourished, well developed in no acute distress HEENT: Normal NECK: No JVD; No carotid bruits LYMPHATICS: No lymphadenopathy CARDIAC:RRR, no murmurs, rubs, gallops RESPIRATORY:  Clear to auscultation without rales, wheezing or rhonchi  ABDOMEN: Soft, non-tender, non-distended MUSCULOSKELETAL:  No edema; No deformity  SKIN: Warm and dry NEUROLOGIC:  Alert and oriented x 3 PSYCHIATRIC:  Normal affect  ASSESSMENT:    1. OSA (obstructive sleep apnea)   2. Permanent atrial fibrillation (HCC)   3. Primary hypertension   4. Chronic diastolic heart failure (HCC)      PLAN:    In order of problems listed above:    OSA - The patient is tolerating PAP therapy well without any problems. The PAP download performed by his DME was personally reviewed and interpreted by me today and showed an AHI of 48.9 /hr on 7 cm H2O with 100% compliance in using more than 4 hours nightly.  The patient has been using and benefiting from PAP use and will continue to benefit from therapy.  - His AHI is very high and he does have a  mask leak but it is not really severe.He is using a chin strap as well. - change to auto CPAP from 4 to 20cm H2O and encouraged him to change out his cushion monthly.  - check download in 4 weeks  Permanent atrial fibrillation -Heart rate is well-controlled today in atrial fibrillation which is permanent.  He denies any palpitations -No bleeding problems on DOAC -Continue Eliquis  5 mg twice daily, Toprol  XL 50 mg daily with as needed refills -Check BMP and CBC  HTN - BP controlled on exam today - Continue Toprol  XL 50 mg daily and doxazosin  4 mg daily with as needed refill  Chronic diastolic CHF - Appears euvolemic on exam today -Continue Jardiance  10 mg daily, Lasix  20 mg daily as needed for weight gain and Toprol  XL 50 mg daily with as needed refills   Followup with me in 1 year   Medication Adjustments/Labs and Tests Ordered: Current medicines are reviewed at length with the patient today.  Concerns regarding medicines are outlined above.  No orders of the defined types were placed in this encounter.  No orders of the defined types were placed in this encounter.   Signed, Wilbert Bihari, MD  08/06/2024 8:31 AM    [facility]

## 2024-08-06 NOTE — Telephone Encounter (Signed)
Order placed to [facility] via community message. 

## 2024-08-06 NOTE — Telephone Encounter (Signed)
-----   Message from Wilbert Bihari sent at 08/06/2024  8:34 AM EST ----- change to auto CPAP from 4 to 20cm H2O and encouraged him to change out his cushion monthly. Get a download in 4 weeks

## 2024-08-07 ENCOUNTER — Ambulatory Visit: Payer: Self-pay | Admitting: Cardiology

## 2024-08-07 LAB — CBC WITH DIFFERENTIAL/PLATELET
Basos: 1 %
EOS (ABSOLUTE): 0.1 x10E3/uL (ref 0.0–0.2)
Eos: 3 %
Hematocrit: 41 % (ref 37.5–51.0)
Hemoglobin: 13.2 g/dL (ref 13.0–17.7)
Immature Granulocytes: 0 %
Immature Granulocytes: 0 x10E3/uL (ref 0.0–0.1)
Lymphs: 16 %
MCH: 28 pg (ref 26.6–33.0)
MCHC: 32.2 g/dL (ref 31.5–35.7)
MCV: 87 fL (ref 79–97)
Monocytes Absolute: 0.2 x10E3/uL (ref 0.0–0.4)
Monocytes Absolute: 0.6 x10E3/uL (ref 0.1–0.9)
Monocytes: 8 %
Neutrophils Absolute: 1.2 x10E3/uL (ref 0.7–3.1)
Neutrophils Absolute: 5.3 x10E3/uL (ref 1.4–7.0)
Neutrophils: 72 %
Platelets: 171 x10E3/uL (ref 150–450)
RBC: 4.72 x10E6/uL (ref 4.14–5.80)
RDW: 14.4 % (ref 11.6–15.4)
WBC: 7.3 x10E3/uL (ref 3.4–10.8)

## 2024-08-07 LAB — BASIC METABOLIC PANEL WITH GFR
BUN/Creatinine Ratio: 15 (ref 10–24)
BUN: 20 mg/dL (ref 8–27)
CO2: 23 mmol/L (ref 20–29)
Calcium: 9.1 mg/dL (ref 8.6–10.2)
Chloride: 101 mmol/L (ref 96–106)
Creatinine, Ser: 1.37 mg/dL — ABNORMAL HIGH (ref 0.76–1.27)
Glucose: 130 mg/dL — ABNORMAL HIGH (ref 70–99)
Potassium: 3.7 mmol/L (ref 3.5–5.2)
Sodium: 139 mmol/L (ref 134–144)
eGFR: 50 mL/min/1.73 — ABNORMAL LOW (ref >59)

## 2024-08-11 DIAGNOSIS — E538 Deficiency of other specified B group vitamins: Secondary | ICD-10-CM | POA: Diagnosis not present

## 2024-08-14 NOTE — Telephone Encounter (Signed)
 Pt returned call - please advise

## 2024-08-14 NOTE — Telephone Encounter (Signed)
 Call to patient to discuss normal lab results. No answer, left detailed message per DPR.

## 2024-08-14 NOTE — Telephone Encounter (Signed)
-----   Message from Cameron [name] sent at 08/07/2024 12:00 PM EST ----- Stable labs - continue current meds and forward to PCP ----- Message ----- From: Interface, [facility] Lab Results In Sent: 08/06/2024  11:36 PM EST To: Cameron JONELLE Bihari, MD
